# Patient Record
Sex: Male | Born: 1935 | Race: White | Hispanic: No | Marital: Married | Smoking: Former smoker
Health system: Southern US, Community
[De-identification: ages and names within clinical notes are randomized; demographics above are authoritative.]

## PROBLEM LIST (undated history)

## (undated) DIAGNOSIS — N189 Chronic kidney disease, unspecified: Secondary | ICD-10-CM

## (undated) DIAGNOSIS — G309 Alzheimer's disease, unspecified: Secondary | ICD-10-CM

## (undated) DIAGNOSIS — S3210XA Unspecified fracture of sacrum, initial encounter for closed fracture: Secondary | ICD-10-CM

## (undated) DIAGNOSIS — E039 Hypothyroidism, unspecified: Secondary | ICD-10-CM

## (undated) DIAGNOSIS — E785 Hyperlipidemia, unspecified: Secondary | ICD-10-CM

## (undated) DIAGNOSIS — F0280 Dementia in other diseases classified elsewhere without behavioral disturbance: Secondary | ICD-10-CM

## (undated) DIAGNOSIS — R0989 Other specified symptoms and signs involving the circulatory and respiratory systems: Secondary | ICD-10-CM

## (undated) DIAGNOSIS — G5790 Unspecified mononeuropathy of unspecified lower limb: Secondary | ICD-10-CM

## (undated) DIAGNOSIS — R3981 Functional urinary incontinence: Secondary | ICD-10-CM

## (undated) DIAGNOSIS — S72142A Displaced intertrochanteric fracture of left femur, initial encounter for closed fracture: Secondary | ICD-10-CM

## (undated) HISTORY — DX: Alzheimer's disease, unspecified: G30.9

## (undated) HISTORY — DX: Dementia in other diseases classified elsewhere, unspecified severity, without behavioral disturbance, psychotic disturbance, mood disturbance, and anxiety: F02.80

## (undated) HISTORY — DX: Hyperlipidemia, unspecified: E78.5

## (undated) HISTORY — PX: HERNIA REPAIR: SHX51

## (undated) HISTORY — DX: Unspecified fracture of sacrum, initial encounter for closed fracture: S32.10XA

---

## 1999-11-29 ENCOUNTER — Ambulatory Visit (HOSPITAL_COMMUNITY): Admission: RE | Admit: 1999-11-29 | Discharge: 1999-11-29 | Payer: Self-pay

## 2000-05-02 ENCOUNTER — Other Ambulatory Visit: Admission: RE | Admit: 2000-05-02 | Discharge: 2000-05-02 | Payer: Self-pay | Admitting: Surgery

## 2000-05-02 ENCOUNTER — Encounter (INDEPENDENT_AMBULATORY_CARE_PROVIDER_SITE_OTHER): Payer: Self-pay | Admitting: Specialist

## 2001-04-03 ENCOUNTER — Encounter: Payer: Self-pay | Admitting: Family Medicine

## 2001-04-03 ENCOUNTER — Ambulatory Visit (HOSPITAL_COMMUNITY): Admission: RE | Admit: 2001-04-03 | Discharge: 2001-04-03 | Payer: Self-pay | Admitting: Family Medicine

## 2001-05-01 ENCOUNTER — Ambulatory Visit (HOSPITAL_COMMUNITY): Admission: RE | Admit: 2001-05-01 | Discharge: 2001-05-01 | Payer: Self-pay | Admitting: Gastroenterology

## 2001-05-01 ENCOUNTER — Encounter (INDEPENDENT_AMBULATORY_CARE_PROVIDER_SITE_OTHER): Payer: Self-pay | Admitting: Specialist

## 2002-12-30 ENCOUNTER — Ambulatory Visit (HOSPITAL_COMMUNITY): Admission: RE | Admit: 2002-12-30 | Discharge: 2002-12-30 | Payer: Self-pay | Admitting: Orthopedic Surgery

## 2002-12-30 ENCOUNTER — Encounter: Payer: Self-pay | Admitting: Orthopedic Surgery

## 2003-03-15 ENCOUNTER — Encounter
Admission: RE | Admit: 2003-03-15 | Discharge: 2003-04-08 | Payer: Self-pay | Admitting: Physical Medicine and Rehabilitation

## 2010-01-29 ENCOUNTER — Ambulatory Visit (HOSPITAL_COMMUNITY): Admission: RE | Admit: 2010-01-29 | Discharge: 2010-01-29 | Payer: Self-pay | Admitting: Ophthalmology

## 2010-04-16 ENCOUNTER — Ambulatory Visit (HOSPITAL_COMMUNITY): Admission: RE | Admit: 2010-04-16 | Discharge: 2010-04-16 | Payer: Self-pay | Admitting: Ophthalmology

## 2010-07-03 ENCOUNTER — Ambulatory Visit: Payer: Self-pay | Admitting: Vascular Surgery

## 2010-07-04 ENCOUNTER — Ambulatory Visit: Payer: Self-pay | Admitting: Vascular Surgery

## 2011-01-28 LAB — BASIC METABOLIC PANEL
BUN: 24 mg/dL — ABNORMAL HIGH (ref 6–23)
CO2: 30 mEq/L (ref 19–32)
Calcium: 9.3 mg/dL (ref 8.4–10.5)
Chloride: 105 mEq/L (ref 96–112)
Creatinine, Ser: 1.19 mg/dL (ref 0.4–1.5)
GFR calc Af Amer: >60 mL/min (ref >60)
GFR calc non Af Amer: 59 mL/min — ABNORMAL LOW (ref >60)
Glucose, Bld: 136 mg/dL — ABNORMAL HIGH (ref 70–99)
Potassium: 4 mEq/L (ref 3.5–5.1)
Sodium: 139 mEq/L (ref 135–145)

## 2011-01-28 LAB — HEMOGLOBIN AND HEMATOCRIT, BLOOD
HCT: 37.7 % — ABNORMAL LOW (ref 39.0–52.0)
Hemoglobin: 12.7 g/dL — ABNORMAL LOW (ref 13.0–17.0)

## 2011-02-04 LAB — BASIC METABOLIC PANEL
BUN: 19 mg/dL (ref 6–23)
CO2: 31 mEq/L (ref 19–32)
Calcium: 9.5 mg/dL (ref 8.4–10.5)
Chloride: 102 mEq/L (ref 96–112)
Creatinine, Ser: 1.24 mg/dL (ref 0.4–1.5)
GFR calc Af Amer: >60 mL/min (ref >60)
GFR calc non Af Amer: 56 mL/min — ABNORMAL LOW (ref >60)
Glucose, Bld: 92 mg/dL (ref 70–99)
Potassium: 4.3 mEq/L (ref 3.5–5.1)
Sodium: 138 mEq/L (ref 135–145)

## 2011-02-04 LAB — HEMOGLOBIN AND HEMATOCRIT, BLOOD
HCT: 39.6 % (ref 39.0–52.0)
Hemoglobin: 13.5 g/dL (ref 13.0–17.0)

## 2011-03-26 NOTE — Procedures (Signed)
CAROTID DUPLEX EXAM   INDICATION:  Followup carotid artery disease.   HISTORY:  Diabetes:  No.  Cardiac:  No.  Hypertension:  No.  Smoking:  No.  Previous Surgery:  No.  CV History:  Asymptomatic, the patient had temporary blindness after  cataract surgery until second surgery.  Amaurosis Fugax No, Paresthesias No, Hemiparesis No                                       RIGHT             LEFT  Brachial systolic pressure:         132               130  Brachial Doppler waveforms:         WNL               WNL  Vertebral direction of flow:        Antegrade         Antegrade  DUPLEX VELOCITIES (cm/sec)  CCA peak systolic                   71                67  ECA peak systolic                   71                70  ICA peak systolic                   73                109  ICA end diastolic                   15                37  PLAQUE MORPHOLOGY:                  Mixed             Mixed  PLAQUE AMOUNT:                      Mild              Mild  PLAQUE LOCATION:                    ICA/bifurcation   ICA/bifurcation   IMPRESSION:  1. Bilateral internal carotid arteries show evidence of 20% to 39%.  2. Right internal carotid artery is tortuous.  3. Unable to replicate elevated velocities obtained at [facility].   ___________________________________________  Di Kindle. Edilia Bo, M.D.   AS/MEDQ  D:  07/03/2010  T:  07/03/2010  Job:  [id number]

## 2011-03-26 NOTE — Consult Note (Signed)
NEW PATIENT CONSULTATION   Joseph [name], Joseph [name]  DOB:  1926-06-09                                       07/04/2010  ZOXWR#:[id number]   I saw the patient in the office today in consultation concerning  bilateral carotid disease.  This is a pleasant 77 year old gentleman who  was found to have carotid bruits.  This prompted a duplex scan which was  done at [facility].  The duplex scan there suggested a greater  than 70% right carotid stenosis with a less than 50% left carotid  stenosis.  He was sent for vascular consultation.  Of note, he is right-  handed.  He denies any history of stroke, TIAs, expressive or receptive  aphasia or amaurosis fugax.   PAST MEDICAL HISTORY:  Significant for lumbar disk disease which has  been stable.  In addition, he had cataract surgery and required surgery  twice but is doing well from this standpoint now.  He denies any history  of diabetes, hypertension, hypercholesterolemia, history of previous  myocardial infarction, history of congestive heart failure or history of  COPD.   SOCIAL HISTORY:  He is married.  He has two children.  He is retired.  He does not smoke cigarettes.   FAMILY HISTORY:  There is no history of premature cardiovascular  disease.   REVIEW OF SYSTEMS:  GENERAL:  He has had no recent weight loss, weight  gain or problems with his appetite.  CARDIOVASCULAR:  He has had no chest pain, chest pressure, palpitations  or arrhythmias.  He does admit to dyspnea on exertion.  He has had no  orthopnea.  He has had no claudication, rest pain or nonhealing ulcers.  He has had no history of DVT or phlebitis.  MUSCULOSKELETAL:  He does have a history of lumbar disk disease and back  pain.  He has no significant arthritis.  NEUROLOGIC:  He has had no dizziness, blackouts, headaches or seizures.  GI, pulmonary, hematologic, GU, ENT, psychiatric, integumentary review  of systems is unremarkable and is documented on the  medical history form  in his chart.   PHYSICAL EXAMINATION:  General:  This is a pleasant 77 year old  gentleman who appears his stated age.  Vital signs:  His blood pressure  is 146/74, heart rate is 66, saturation 100%.  HEENT:  Unremarkable.  Lungs:  Are clear bilaterally to auscultation without rales, rhonchi or  wheezing.  Cardiovascular:  He has a soft left carotid bruit.  He has a  regular rate and rhythm.  He has palpable femoral, popliteal and  posterior tibial pulses bilaterally.  I cannot palpate dorsalis pedis  pulses.  He has no significant lower extremity swelling.  Abdomen:  Soft  and nontender with normal pitched bowel sounds.  No aneurysm is  appreciated.  Musculoskeletal:  There are no major deformities or  cyanosis.  Neurological:  He has no focal weakness or paresthesias.  Skin:  There are no ulcers or rashes.   I did review his study from [facility] and based on the velocity  criteria on the right the stenosis would still be less than, would be in  the 60%-79% range.   We did obtain a carotid duplex scan here which I independently  interpreted and we were unable to obtain the velocities noted on the  study at [facility].  He had really no significant elevated  velocities in either carotid and he had therefore less than a 39%  stenosis bilaterally.  Both vertebral arteries were patent with normally  directed flow.   Of note, the greyscale imaging done at [facility] also really did not  demonstrate any significant stenosis.   Based on our duplex scan I do not think he has significant carotid  disease.  In addition, he is asymptomatic.  I have encouraged him to  continue taking his aspirin.  I would recommend a followup duplex scan  in 1 year and that can be arranged by Dr. Christell Constant at [facility].  I  would be happy to see him at any time if any new vascular issues arise.     Di Kindle. Edilia Bo, M.D.  Electronically Signed   CSD/MEDQ  D:   07/04/2010  T:  07/05/2010  Job:  [id number]   cc:   Ernestina Penna, M.D.

## 2011-03-29 NOTE — Procedures (Signed)
[facility]  Patient:    Joseph [name], Joseph [name]                             MRN: [id number] Proc. Date: 05/01/01 Adm. Date:  [id number] Attending:  Nelda Marseille CC:         Monica Becton, M.D.   Procedure Report  PROCEDURE:  Colonoscopy.  INDICATIONS FOR PROCEDURE:  A patient with recurring diverticulitis want to make sure no other etiologies.  Consent was signed after risks, benefits, methods, and options were thoroughly discussed multiple times in the office.  MEDICINES USED:  Demerol 60, Versed 5.  DESCRIPTION OF PROCEDURE:  Rectal inspection is pertinent for external hemorrhoids. Digital exam is negative. The pediatric video colonoscope was inserted and easily advanced around the colon to the cecum. This did not require any abdominal pressure or any position changes. The cecum was identified by the appendiceal orifice and the ileocecal valve. In fact, the scope was inserted a short ways into the terminal ileum which was normal. Photo documentation was obtained. On insertion other than some occasional left sided diverticula, no other abnormalities were seen. The prep was adequate. There was some liquid stool that required washing and suctioning. On slow withdrawal through the colon, a tiny possible mid ascending polyp was seen and was cold biopsied x 2. No other polypoid lesions, masses or other abnormalities except for a rare probable prep induced erosion was seen in the rectum and distal sigmoid. Also distal sigmoid diverticula were seen only. Once back in the rectum, the scope was retroflexed pertinent for some internal hemorrhoids. The scope was straightened and readvanced a short ways around the sigmoid, air was suctioned the scope removed. The patient tolerated the procedure well and there was no obvious or immediate complication.  ENDOSCOPIC DIAGNOSIS: 1. Internal/external hemorrhoids. 2. Sigmoid diverticula only. 3. Tiny ascending polyp  cold biopsied. 4. Otherwise within normal limits to the terminal ileum.  PLAN:  Await pathology and consider repeat screening in five years. Be happy to see back p.r.n. otherwise return care to Dr. Christell Constant for the customary yearly rectals and guaiacs but based on a negative BE last year except for the diverticula and the colonoscopy this year without significant finding with the diverticula, I do not believe any further colonic workup plans need to be done in the near future. DD:  05/01/01 TD:  05/01/01 Job: [id number]

## 2011-07-17 ENCOUNTER — Ambulatory Visit: Payer: Medicare Other | Attending: Family Medicine | Admitting: Physical Therapy

## 2011-07-17 DIAGNOSIS — IMO0001 Reserved for inherently not codable concepts without codable children: Secondary | ICD-10-CM | POA: Insufficient documentation

## 2011-07-17 DIAGNOSIS — R269 Unspecified abnormalities of gait and mobility: Secondary | ICD-10-CM | POA: Insufficient documentation

## 2011-07-17 DIAGNOSIS — R5381 Other malaise: Secondary | ICD-10-CM | POA: Insufficient documentation

## 2011-07-22 ENCOUNTER — Ambulatory Visit: Payer: Medicare Other | Admitting: Physical Therapy

## 2011-07-24 ENCOUNTER — Ambulatory Visit: Payer: Medicare Other | Admitting: Physical Therapy

## 2011-07-29 ENCOUNTER — Ambulatory Visit: Payer: Medicare Other | Admitting: Physical Therapy

## 2011-07-31 ENCOUNTER — Ambulatory Visit: Payer: Medicare Other | Admitting: Physical Therapy

## 2011-08-05 ENCOUNTER — Ambulatory Visit: Payer: Medicare Other | Admitting: Physical Therapy

## 2011-08-07 ENCOUNTER — Ambulatory Visit: Payer: Medicare Other | Admitting: Physical Therapy

## 2011-08-12 ENCOUNTER — Ambulatory Visit: Payer: Medicare Other | Attending: Family Medicine | Admitting: Physical Therapy

## 2011-08-12 DIAGNOSIS — R269 Unspecified abnormalities of gait and mobility: Secondary | ICD-10-CM | POA: Insufficient documentation

## 2011-08-12 DIAGNOSIS — R5381 Other malaise: Secondary | ICD-10-CM | POA: Insufficient documentation

## 2011-08-12 DIAGNOSIS — IMO0001 Reserved for inherently not codable concepts without codable children: Secondary | ICD-10-CM | POA: Insufficient documentation

## 2011-08-14 ENCOUNTER — Ambulatory Visit: Payer: Medicare Other | Admitting: Physical Therapy

## 2011-08-19 ENCOUNTER — Ambulatory Visit: Payer: Medicare Other | Admitting: Physical Therapy

## 2011-08-21 ENCOUNTER — Ambulatory Visit: Payer: Medicare Other | Admitting: Physical Therapy

## 2011-08-26 ENCOUNTER — Ambulatory Visit: Payer: Medicare Other | Admitting: Physical Therapy

## 2011-08-28 ENCOUNTER — Encounter: Payer: Medicare Other | Admitting: Physical Therapy

## 2012-02-25 DIAGNOSIS — G609 Hereditary and idiopathic neuropathy, unspecified: Secondary | ICD-10-CM | POA: Diagnosis not present

## 2012-02-25 DIAGNOSIS — M545 Low back pain: Secondary | ICD-10-CM | POA: Diagnosis not present

## 2012-02-25 DIAGNOSIS — M5126 Other intervertebral disc displacement, lumbar region: Secondary | ICD-10-CM | POA: Diagnosis not present

## 2012-03-19 DIAGNOSIS — R209 Unspecified disturbances of skin sensation: Secondary | ICD-10-CM | POA: Diagnosis not present

## 2012-03-19 DIAGNOSIS — R269 Unspecified abnormalities of gait and mobility: Secondary | ICD-10-CM | POA: Diagnosis not present

## 2012-03-19 DIAGNOSIS — F0280 Dementia in other diseases classified elsewhere without behavioral disturbance: Secondary | ICD-10-CM | POA: Diagnosis not present

## 2012-03-19 DIAGNOSIS — M79609 Pain in unspecified limb: Secondary | ICD-10-CM | POA: Diagnosis not present

## 2012-03-26 DIAGNOSIS — R209 Unspecified disturbances of skin sensation: Secondary | ICD-10-CM | POA: Diagnosis not present

## 2012-03-26 DIAGNOSIS — R6889 Other general symptoms and signs: Secondary | ICD-10-CM | POA: Diagnosis not present

## 2012-03-26 DIAGNOSIS — F0280 Dementia in other diseases classified elsewhere without behavioral disturbance: Secondary | ICD-10-CM | POA: Diagnosis not present

## 2012-03-26 DIAGNOSIS — R269 Unspecified abnormalities of gait and mobility: Secondary | ICD-10-CM | POA: Diagnosis not present

## 2012-03-26 DIAGNOSIS — M79609 Pain in unspecified limb: Secondary | ICD-10-CM | POA: Diagnosis not present

## 2012-03-26 DIAGNOSIS — D518 Other vitamin B12 deficiency anemias: Secondary | ICD-10-CM | POA: Diagnosis not present

## 2012-03-26 DIAGNOSIS — G63 Polyneuropathy in diseases classified elsewhere: Secondary | ICD-10-CM | POA: Diagnosis not present

## 2012-04-15 DIAGNOSIS — R55 Syncope and collapse: Secondary | ICD-10-CM | POA: Diagnosis not present

## 2012-04-15 DIAGNOSIS — R0989 Other specified symptoms and signs involving the circulatory and respiratory systems: Secondary | ICD-10-CM | POA: Diagnosis not present

## 2012-04-15 DIAGNOSIS — Z79899 Other long term (current) drug therapy: Secondary | ICD-10-CM | POA: Diagnosis not present

## 2012-04-17 ENCOUNTER — Encounter: Payer: Self-pay | Admitting: Vascular Surgery

## 2012-04-17 ENCOUNTER — Other Ambulatory Visit: Payer: Self-pay

## 2012-04-17 DIAGNOSIS — R0989 Other specified symptoms and signs involving the circulatory and respiratory systems: Secondary | ICD-10-CM

## 2012-04-22 ENCOUNTER — Encounter: Payer: Self-pay | Admitting: Vascular Surgery

## 2012-04-23 ENCOUNTER — Ambulatory Visit (INDEPENDENT_AMBULATORY_CARE_PROVIDER_SITE_OTHER): Payer: Medicare Other | Admitting: Vascular Surgery

## 2012-04-23 ENCOUNTER — Encounter: Payer: Self-pay | Admitting: Vascular Surgery

## 2012-04-23 ENCOUNTER — Other Ambulatory Visit (INDEPENDENT_AMBULATORY_CARE_PROVIDER_SITE_OTHER): Payer: Medicare Other | Admitting: *Deleted

## 2012-04-23 VITALS — BP 141/75 | HR 68 | Temp 98.7°F | Ht 69.0 in | Wt 137.0 lb

## 2012-04-23 DIAGNOSIS — R0989 Other specified symptoms and signs involving the circulatory and respiratory systems: Secondary | ICD-10-CM | POA: Diagnosis not present

## 2012-04-23 HISTORY — DX: Other specified symptoms and signs involving the circulatory and respiratory systems: R09.89

## 2012-04-23 NOTE — Progress Notes (Signed)
History of Present Illness:  Patient is a 78 y.o. year old male who presents for evaluation of carotid stenosis.  Symptoms related to this stenosis include a recent brief syncopal episode lasted less than 1 minute.  The patient denies symptoms of TIA, amaurosis, or stroke.  The patient is currently on aspirin antiplatelet therapy.  The patient had a previous carotid duplex exam in our office in 2011 which showed no significant carotid stenosis.  Other medical problems include dementia.  This is currently stable and followed by Dr. Rudi Heap. The patient denies any history of palpitations. He is a former smoker but quit 4 years ago.  Past Medical History  Diagnosis Date  . Alzheimer disease     Past Surgical History  Procedure Date  . Hernia repair      Social History History  Substance Use Topics  . Smoking status: Former Smoker -- 40 years    Types: Cigarettes, Pipe    Quit date: 04/23/1972  . Smokeless tobacco: Former Neurosurgeon    Types: Chew    Quit date: 04/23/1972  . Alcohol Use: No    Family History History reviewed. No pertinent family history.  Allergies  Allergies  Allergen Reactions  . Naproxen   . Sulfa Antibiotics      Current Outpatient Prescriptions  Medication Sig Dispense Refill  . aspirin 81 MG tablet Take 81 mg by mouth daily.      . Cholecalciferol (VITAMIN D3) 2000 UNITS capsule Take 2,000 Units by mouth daily.      Marland Kitchen donepezil (ARICEPT) 10 MG tablet Take 10 mg by mouth daily.      Marland Kitchen levothyroxine (SYNTHROID, LEVOTHROID) 50 MCG tablet Take 50 mcg by mouth daily. Take 1/2 to 1 tablet once daily      . memantine (NAMENDA) 10 MG tablet Take 10 mg by mouth 2 (two) times daily.        ROS:   General:  No weight loss, Fever, chills  HEENT: No recent headaches, no nasal bleeding, no visual changes, no sore throat  Neurologic: No dizziness or seizures. No recent symptoms of stroke or mini- stroke. No recent episodes of slurred speech, or temporary  blindness.  Cardiac: No recent episodes of chest pain/pressure, no shortness of breath at rest.  No shortness of breath with exertion.  Denies history of atrial fibrillation or irregular heartbeat  Vascular: No history of rest pain in feet.  No history of claudication.  No history of non-healing ulcer, No history of DVT   Pulmonary: No home oxygen, no productive cough, no hemoptysis,  No asthma or wheezing  Musculoskeletal:  [ ]  Arthritis, [ ]  Low back pain,  [ ]  Joint pain  Hematologic:No history of hypercoagulable state.  No history of easy bleeding.  No history of anemia  Gastrointestinal: No hematochezia or melena,  No gastroesophageal reflux, no trouble swallowing  Urinary: [ ]  chronic Kidney disease, [ ]  on HD - [ ]  MWF or [ ]  TTHS, [ ]  Burning with urination, [ ]  Frequent urination, [ ]  Difficulty urinating;   Skin: No rashes  Psychological: No history of anxiety,  No history of depression   Physical Examination  Filed Vitals:   04/23/12 1236  BP: 141/75  Pulse: 68  Temp: 98.7 F (37.1 C)  TempSrc: Oral  Height: 5\' 9"  (1.753 m)  Weight: 137 lb (62.143 kg)  SpO2: 97%    Body mass index is 20.23 kg/(m^2).  General:  Alert and oriented, no acute distress HEENT:  Normal Neck: No bruit or JVD on my exam Pulmonary: Clear to auscultation bilaterally Cardiac: Regular Rate and Rhythm without murmur Gastrointestinal: Soft, non-tender, non-distended, no mass, no scars Skin: No rash Extremity Pulses:  2+ radial, brachial, femoral, popliteal pulses bilaterally, absent dorsalis pedis and posterior tibial pulses Musculoskeletal: No deformity or edema  Neurologic: Upper and lower extremity motor 5/5 and symmetric  DATA: Patient had a bilateral carotid duplex exam today which showed less than 40% stenosis bilaterally. He had antegrade vertebral flow bilaterally. I reviewed and interpreted this study. This is unchanged from 2011.   ASSESSMENT:   Syncopal episode with no  significant carotid stenosis   PLAN:  Continued followup with Dr. Rudi Heap if further episodes with consideration for cardiac evaluation   Fabienne Bruns, MD [facility] Office: [phone] Pager: [phone]

## 2012-04-24 ENCOUNTER — Other Ambulatory Visit: Payer: Self-pay | Admitting: Family Medicine

## 2012-04-24 DIAGNOSIS — M545 Low back pain: Secondary | ICD-10-CM

## 2012-04-29 ENCOUNTER — Ambulatory Visit
Admission: RE | Admit: 2012-04-29 | Discharge: 2012-04-29 | Disposition: A | Discharge status: Home / Self Care | Payer: Medicare Other | Source: Ambulatory Visit | Attending: Family Medicine | Admitting: Family Medicine

## 2012-04-29 DIAGNOSIS — M5126 Other intervertebral disc displacement, lumbar region: Secondary | ICD-10-CM | POA: Diagnosis not present

## 2012-04-29 DIAGNOSIS — M47817 Spondylosis without myelopathy or radiculopathy, lumbosacral region: Secondary | ICD-10-CM | POA: Diagnosis not present

## 2012-04-29 DIAGNOSIS — M5137 Other intervertebral disc degeneration, lumbosacral region: Secondary | ICD-10-CM | POA: Diagnosis not present

## 2012-04-29 DIAGNOSIS — M545 Low back pain: Secondary | ICD-10-CM

## 2012-05-01 NOTE — Procedures (Unsigned)
CAROTID DUPLEX EXAM  INDICATION:  Carotid bruit  HISTORY: Diabetes:  No Cardiac:  No Hypertension:  No Smoking:  Previous Previous Surgery:  No CV History:  History of possible TIA Amaurosis Fugax No, Paresthesias No, Hemiparesis No                                      RIGHT             LEFT Brachial systolic pressure: Brachial Doppler waveforms: Vertebral direction of flow:        Antegrade         Antegrade DUPLEX VELOCITIES (cm/sec) CCA peak systolic                   77                68 ECA peak systolic                   60                75 ICA peak systolic                   42                46 ICA end diastolic                   11                14 PLAQUE MORPHOLOGY:                  Mixed             Mixed PLAQUE AMOUNT:                      Mild              Mild PLAQUE LOCATION:                    ICA               ICA  IMPRESSION:  Doppler velocities suggest 1% to 39% stenoses noted in the bilateral proximal internal carotid arteries.  ___________________________________________ Janetta Hora Fields, MD  CH/MEDQ  D:  04/27/2012  T:  04/27/2012  Job:  [id number]

## 2012-05-09 DIAGNOSIS — M545 Low back pain: Secondary | ICD-10-CM | POA: Diagnosis not present

## 2012-05-21 DIAGNOSIS — K219 Gastro-esophageal reflux disease without esophagitis: Secondary | ICD-10-CM | POA: Diagnosis not present

## 2012-05-21 DIAGNOSIS — N400 Enlarged prostate without lower urinary tract symptoms: Secondary | ICD-10-CM | POA: Diagnosis not present

## 2012-06-08 DIAGNOSIS — G609 Hereditary and idiopathic neuropathy, unspecified: Secondary | ICD-10-CM | POA: Diagnosis not present

## 2012-07-09 DIAGNOSIS — G609 Hereditary and idiopathic neuropathy, unspecified: Secondary | ICD-10-CM | POA: Diagnosis not present

## 2012-08-24 DIAGNOSIS — K219 Gastro-esophageal reflux disease without esophagitis: Secondary | ICD-10-CM | POA: Diagnosis not present

## 2012-08-24 DIAGNOSIS — Z23 Encounter for immunization: Secondary | ICD-10-CM | POA: Diagnosis not present

## 2012-08-24 DIAGNOSIS — F0281 Dementia in other diseases classified elsewhere with behavioral disturbance: Secondary | ICD-10-CM | POA: Diagnosis not present

## 2012-08-27 DIAGNOSIS — R5383 Other fatigue: Secondary | ICD-10-CM | POA: Diagnosis not present

## 2012-08-27 DIAGNOSIS — E785 Hyperlipidemia, unspecified: Secondary | ICD-10-CM | POA: Diagnosis not present

## 2012-08-27 DIAGNOSIS — R5381 Other malaise: Secondary | ICD-10-CM | POA: Diagnosis not present

## 2012-08-27 DIAGNOSIS — E559 Vitamin D deficiency, unspecified: Secondary | ICD-10-CM | POA: Diagnosis not present

## 2012-09-03 DIAGNOSIS — G609 Hereditary and idiopathic neuropathy, unspecified: Secondary | ICD-10-CM | POA: Diagnosis not present

## 2012-10-20 DIAGNOSIS — R350 Frequency of micturition: Secondary | ICD-10-CM | POA: Diagnosis not present

## 2012-10-20 DIAGNOSIS — R351 Nocturia: Secondary | ICD-10-CM | POA: Diagnosis not present

## 2012-10-20 DIAGNOSIS — R3915 Urgency of urination: Secondary | ICD-10-CM | POA: Diagnosis not present

## 2012-12-08 DIAGNOSIS — I10 Essential (primary) hypertension: Secondary | ICD-10-CM | POA: Diagnosis not present

## 2012-12-08 DIAGNOSIS — E785 Hyperlipidemia, unspecified: Secondary | ICD-10-CM | POA: Diagnosis not present

## 2012-12-08 DIAGNOSIS — G589 Mononeuropathy, unspecified: Secondary | ICD-10-CM | POA: Diagnosis not present

## 2012-12-08 DIAGNOSIS — K219 Gastro-esophageal reflux disease without esophagitis: Secondary | ICD-10-CM | POA: Diagnosis not present

## 2012-12-08 DIAGNOSIS — N400 Enlarged prostate without lower urinary tract symptoms: Secondary | ICD-10-CM | POA: Diagnosis not present

## 2012-12-08 DIAGNOSIS — E039 Hypothyroidism, unspecified: Secondary | ICD-10-CM | POA: Diagnosis not present

## 2012-12-08 DIAGNOSIS — E559 Vitamin D deficiency, unspecified: Secondary | ICD-10-CM | POA: Diagnosis not present

## 2012-12-15 DIAGNOSIS — R351 Nocturia: Secondary | ICD-10-CM | POA: Diagnosis not present

## 2012-12-15 DIAGNOSIS — R3915 Urgency of urination: Secondary | ICD-10-CM | POA: Diagnosis not present

## 2013-01-07 DIAGNOSIS — H903 Sensorineural hearing loss, bilateral: Secondary | ICD-10-CM | POA: Diagnosis not present

## 2013-04-14 ENCOUNTER — Encounter: Payer: Self-pay | Admitting: Family Medicine

## 2013-04-14 ENCOUNTER — Ambulatory Visit (INDEPENDENT_AMBULATORY_CARE_PROVIDER_SITE_OTHER): Payer: Medicare Other | Admitting: Family Medicine

## 2013-04-14 VITALS — BP 120/72 | HR 69 | Temp 96.9°F | Ht 69.75 in | Wt 136.4 lb

## 2013-04-14 DIAGNOSIS — R7989 Other specified abnormal findings of blood chemistry: Secondary | ICD-10-CM

## 2013-04-14 DIAGNOSIS — R350 Frequency of micturition: Secondary | ICD-10-CM

## 2013-04-14 DIAGNOSIS — R29898 Other symptoms and signs involving the musculoskeletal system: Secondary | ICD-10-CM | POA: Insufficient documentation

## 2013-04-14 DIAGNOSIS — R5381 Other malaise: Secondary | ICD-10-CM | POA: Diagnosis not present

## 2013-04-14 DIAGNOSIS — E039 Hypothyroidism, unspecified: Secondary | ICD-10-CM | POA: Diagnosis not present

## 2013-04-14 DIAGNOSIS — R5383 Other fatigue: Secondary | ICD-10-CM | POA: Insufficient documentation

## 2013-04-14 DIAGNOSIS — R413 Other amnesia: Secondary | ICD-10-CM | POA: Diagnosis not present

## 2013-04-14 DIAGNOSIS — R799 Abnormal finding of blood chemistry, unspecified: Secondary | ICD-10-CM | POA: Diagnosis not present

## 2013-04-14 DIAGNOSIS — E785 Hyperlipidemia, unspecified: Secondary | ICD-10-CM

## 2013-04-14 DIAGNOSIS — E559 Vitamin D deficiency, unspecified: Secondary | ICD-10-CM

## 2013-04-14 LAB — POCT URINALYSIS DIPSTICK
Bilirubin, UA: NEGATIVE
Glucose, UA: NEGATIVE
Leukocytes, UA: NEGATIVE
Nitrite, UA: NEGATIVE
Urobilinogen, UA: NEGATIVE
pH, UA: 6

## 2013-04-14 LAB — BASIC METABOLIC PANEL WITH GFR
Calcium: 9.2 mg/dL (ref 8.4–10.5)
GFR, Est African American: 63 mL/min
GFR, Est Non African American: 55 mL/min — ABNORMAL LOW
Glucose, Bld: 96 mg/dL (ref 70–99)
Potassium: 4.6 mEq/L (ref 3.5–5.3)
Sodium: 140 mEq/L (ref 135–145)

## 2013-04-14 LAB — POCT CBC
HCT, POC: 42 % — AB (ref 43.5–53.7)
Hemoglobin: 14.6 g/dL (ref 14.1–18.1)
MCH, POC: 32.9 pg — AB (ref 27–31.2)
MCHC: 34.8 g/dL (ref 31.8–35.4)
MPV: 7.3 fL (ref 0–99.8)
POC LYMPH PERCENT: 21.4 %L (ref 10–50)
RBC: 4.4 M/uL — AB (ref 4.69–6.13)

## 2013-04-14 LAB — HEPATIC FUNCTION PANEL
ALT: 11 U/L (ref 0–53)
Bilirubin, Direct: 0.1 mg/dL (ref 0.0–0.3)
Indirect Bilirubin: 0.6 mg/dL (ref 0.0–0.9)
Total Bilirubin: 0.7 mg/dL (ref 0.3–1.2)

## 2013-04-14 LAB — THYROID PANEL WITH TSH
Free Thyroxine Index: 2.4 (ref 1.0–3.9)
T3 Uptake: 32.9 % (ref 22.5–37.0)
T4, Total: 7.2 ug/dL (ref 5.0–12.5)
TSH: 3.04 u[IU]/mL (ref 0.350–4.500)

## 2013-04-14 LAB — POCT UA - MICROSCOPIC ONLY
Bacteria, U Microscopic: NEGATIVE
Casts, Ur, LPF, POC: NEGATIVE

## 2013-04-14 MED ORDER — MEMANTINE HCL 10 MG PO TABS: 10.0000 mg | ORAL_TABLET | Freq: Two times a day (BID) | ORAL | Status: DC

## 2013-04-14 MED ORDER — GABAPENTIN 300 MG PO CAPS: 600.0000 mg | ORAL_CAPSULE | Freq: Two times a day (BID) | ORAL | Status: DC

## 2013-04-14 MED ORDER — DONEPEZIL HCL 10 MG PO TABS: 10.0000 mg | ORAL_TABLET | Freq: Every day | ORAL | Status: DC

## 2013-04-14 MED ORDER — LEVOTHYROXINE SODIUM 50 MCG PO TABS: 50.0000 ug | ORAL_TABLET | Freq: Every day | ORAL | Status: DC

## 2013-04-14 NOTE — Progress Notes (Signed)
Subjective:    Patient ID: Joseph [name], male    DOB: Aug 11, 1926, 79 y.o.   MRN: [id number]  HPI This patient presents for recheck of multiple medical problems. His wife accompanies the patient today accompanies the patient today. A handwritten note from the family. The note indicates that the patient saw the audiologist in February and he has a significant hearing loss. He saw the urologist and medication given has a lot of side effects and aggravates his memory. He is tiring more easily and has less energy. Legs and feet remain numb. He stays cold. And he is forgetting more. The handwritten note the family gave me will be scanned into the chart.  Patient Active Problem List   Diagnosis Date Noted  . Other symptoms involving cardiovascular system 04/23/2012    In addition, see review of system.  The allergies, current medications, past medical history, surgical history, family and social history are reviewed.  Immunizations reviewed.  Health maintenance reviewed.  The following items are outstanding: Zostavax.      Review of Systems  Constitutional: Positive for fatigue (tires more easily).  HENT: Positive for postnasal drip (due to allergies).   Eyes: Negative.   Respiratory: Negative.   Cardiovascular: Negative.   Gastrointestinal: Negative.   Endocrine: Positive for cold intolerance.  Genitourinary: Positive for urgency (worsening) and frequency (at hs).  Allergic/Immunologic: Positive for environmental allergies (seasonal).  Neurological: Positive for weakness (legs, neuropathy). Negative for dizziness and headaches.  Psychiatric/Behavioral: Positive for confusion and sleep disturbance (wakes to go urinate).       Objective:   Physical Exam BP 120/72  Pulse 69  Temp(Src) 96.9 F (36.1 C) (Oral)  Ht 5' 9.75" (1.772 m)  Wt 136 lb 6.4 oz (61.871 kg)  BMI 19.7 kg/m2  The patient appeared well nourished and normally developed.He has diminished alertness  and orientation  to time and place. Speech, behavior and judgement appear somewhat compromised . Vital signs as documented.  Head exam is unremarkable. No scleral icterus or pallor noted. He is now wearing a hearing aid in the right ear.  Neck is without jugular venous distension, or thyromegally. He does have carotid bruits. Carotid upstrokes are brisk bilaterally. No cervical adenopathy. Lungs are clear anteriorly and posteriorly to auscultation. Normal respiratory effort. Cardiac exam reveals regular rate and rhythm at 84 per minute. First and second heart sounds normal.  No murmurs, rubs or gallops.  Abdominal exam reveals normal bowl sounds, no masses, no organomegaly and no aortic enlargement. No inguinal adenopathy. Extremities are nonedematous and both femoral  pulses are normal. Skin without pallor or jaundice.  Warm and dry, without rash. Neurologic exam reveals normal deep tendon reflexes and normal sensation.           Assessment & Plan:  1. Urinary frequency - POCT urinalysis dipstick - POCT UA - Microscopic Only  2. Fatigue - POCT CBC; Standing - Thyroid Panel With TSH  3. Hypothyroid - Thyroid Panel With TSH  4. Hyperlipemia - Hepatic function panel - NMR Lipoprofile with Lipids; Standing  5. Elevated serum creatinine - BASIC METABOLIC PANEL WITH GFR; Standing  6. Vitamin D deficiency - Vitamin D 25 hydroxy; Standing  7. Memory disturbance Wife seems to think this has stabilized somewhat with medication  8. Leg weakness, bilateral write a prescription for a walker and see if we can get someone ago in the home and help train him for using this Home has been handicap equipped  Patient Instructions  Fall  precautions discussed  Continue current meds and therapeutic lifestyle changes

## 2013-04-14 NOTE — Patient Instructions (Addendum)
Fall precautions discussed Continue current meds and therapeutic lifestyle changes 

## 2013-04-14 NOTE — Addendum Note (Signed)
Addended by: Bearl Mulberry on: 04/14/2013 10:09 AM   Modules accepted: Orders

## 2013-04-14 NOTE — Addendum Note (Signed)
Addended by: Lisbeth Ply C on: 04/14/2013 10:36 AM   Modules accepted: Orders

## 2013-04-14 NOTE — Addendum Note (Signed)
Addended by: Ayaz Sondgeroth C on: 04/14/2013 10:36 AM   Modules accepted: Orders  

## 2013-04-15 ENCOUNTER — Other Ambulatory Visit: Payer: Self-pay | Admitting: *Deleted

## 2013-04-15 LAB — NMR LIPOPROFILE WITH LIPIDS
HDL Size: 9.9 nm (ref >=9.2)
HDL-C: 57 mg/dL (ref >=40)
LDL (calc): 109 mg/dL — ABNORMAL HIGH (ref <100)
LDL Particle Number: 1194 nmol/L — ABNORMAL HIGH (ref <1000)
LDL Size: 20.8 nm (ref >20.5)
LP-IR Score: <25 (ref <=45)
Small LDL Particle Number: 266 nmol/L (ref <=527)
VLDL Size: 38.8 nm (ref <=46.6)

## 2013-04-15 LAB — VITAMIN D 25 HYDROXY (VIT D DEFICIENCY, FRACTURES): Vit D, 25-Hydroxy: 49 ng/mL (ref 30–89)

## 2013-04-15 MED ORDER — MEMANTINE HCL ER 28 MG PO CP24: 1.0000 | ORAL_CAPSULE | Freq: Every day | ORAL | Status: DC

## 2013-04-22 DIAGNOSIS — G609 Hereditary and idiopathic neuropathy, unspecified: Secondary | ICD-10-CM | POA: Diagnosis not present

## 2013-04-22 DIAGNOSIS — M545 Low back pain: Secondary | ICD-10-CM | POA: Diagnosis not present

## 2013-05-19 ENCOUNTER — Encounter: Payer: Self-pay | Admitting: Family Medicine

## 2013-05-19 ENCOUNTER — Ambulatory Visit (INDEPENDENT_AMBULATORY_CARE_PROVIDER_SITE_OTHER): Payer: Medicare Other | Admitting: Family Medicine

## 2013-05-19 VITALS — BP 140/81 | HR 75 | Temp 97.2°F | Wt 139.0 lb

## 2013-05-19 DIAGNOSIS — T148 Other injury of unspecified body region: Secondary | ICD-10-CM

## 2013-05-19 DIAGNOSIS — W57XXXA Bitten or stung by nonvenomous insect and other nonvenomous arthropods, initial encounter: Secondary | ICD-10-CM

## 2013-05-19 MED ORDER — DOXYCYCLINE HYCLATE 100 MG PO TABS: 100.0000 mg | ORAL_TABLET | Freq: Two times a day (BID) | ORAL | Status: DC

## 2013-05-19 NOTE — Patient Instructions (Signed)
Contusion A contusion is a deep bruise. Contusions are the result of an injury that caused bleeding under the skin. The contusion may turn blue, purple, or yellow. Minor injuries will give you a painless contusion, but more severe contusions may stay painful and swollen for a few weeks.  CAUSES  A contusion is usually caused by a blow, trauma, or direct force to an area of the body. SYMPTOMS   Swelling and redness of the injured area.  Bruising of the injured area.  Tenderness and soreness of the injured area.  Pain. DIAGNOSIS  The diagnosis can be made by taking a history and physical exam. An X-ray, CT scan, or MRI may be needed to determine if there were any associated injuries, such as fractures. TREATMENT  Specific treatment will depend on what area of the body was injured. In general, the best treatment for a contusion is resting, icing, elevating, and applying cold compresses to the injured area. Over-the-counter medicines may also be recommended for pain control. Ask your caregiver what the best treatment is for your contusion. HOME CARE INSTRUCTIONS   Put ice on the injured area.  Put ice in a plastic bag.  Place a towel between your skin and the bag.  Leave the ice on for 15-20 minutes, 3-4 times a day.  Only take over-the-counter or prescription medicines for pain, discomfort, or fever as directed by your caregiver. Your caregiver may recommend avoiding anti-inflammatory medicines (aspirin, ibuprofen, and naproxen) for 48 hours because these medicines may increase bruising.  Rest the injured area.  If possible, elevate the injured area to reduce swelling. SEEK IMMEDIATE MEDICAL CARE IF:   You have increased bruising or swelling.  You have pain that is getting worse.  Your swelling or pain is not relieved with medicines. MAKE SURE YOU:   Understand these instructions.  Will watch your condition.  Will get help right away if you are not doing well or get  worse. Document Released: 08/07/2005 Document Revised: 01/20/2012 Document Reviewed: 09/02/2011 [employer] Patient Information 2014 [employer].  

## 2013-05-19 NOTE — Progress Notes (Signed)
  Subjective:    Patient ID: Joseph [name], male    DOB: September 17, 1926, 79 y.o.   MRN: [id number]  HPI This 79 y.o. male presents for evaluation of bruise or insect bite right leg.  His daughter Accompanies him and states he has been itching his right leg and when she looked she Found a rash and bruising and was worried it could have been a tick or spider bite.   Review of Systems No chest pain, SOB, HA, dizziness, vision change, N/V, diarrhea, constipation, dysuria, urinary urgency or frequency, myalgias, arthralgias or rash.     Objective:   Physical Exam' Vital signs noted  Well developed well nourished male.  HEENT - Head atraumatic Normocephalic. Respiratory - Lungs CTA bilateral Cardiac - RRR S1 and S2 without murmur Skin - Bruising right leg about 5cm diameter with central area with scab.      Assessment & Plan:  Tick bite Doxycycline 100mg  one po bid x 10 days #20, discussed he follow up prn if not better.

## 2013-08-11 ENCOUNTER — Ambulatory Visit (INDEPENDENT_AMBULATORY_CARE_PROVIDER_SITE_OTHER): Payer: Medicare Other

## 2013-08-11 DIAGNOSIS — Z23 Encounter for immunization: Secondary | ICD-10-CM

## 2013-09-30 ENCOUNTER — Ambulatory Visit (INDEPENDENT_AMBULATORY_CARE_PROVIDER_SITE_OTHER): Payer: Medicare Other

## 2013-09-30 ENCOUNTER — Encounter: Payer: Self-pay | Admitting: Family Medicine

## 2013-09-30 ENCOUNTER — Ambulatory Visit (INDEPENDENT_AMBULATORY_CARE_PROVIDER_SITE_OTHER): Payer: Medicare Other | Admitting: Family Medicine

## 2013-09-30 VITALS — BP 116/70 | HR 76 | Temp 97.4°F | Ht 69.0 in | Wt 135.0 lb

## 2013-09-30 DIAGNOSIS — R5381 Other malaise: Secondary | ICD-10-CM

## 2013-09-30 DIAGNOSIS — R413 Other amnesia: Secondary | ICD-10-CM | POA: Diagnosis not present

## 2013-09-30 DIAGNOSIS — R29898 Other symptoms and signs involving the musculoskeletal system: Secondary | ICD-10-CM

## 2013-09-30 DIAGNOSIS — E039 Hypothyroidism, unspecified: Secondary | ICD-10-CM

## 2013-09-30 DIAGNOSIS — R5383 Other fatigue: Secondary | ICD-10-CM

## 2013-09-30 DIAGNOSIS — E785 Hyperlipidemia, unspecified: Secondary | ICD-10-CM | POA: Diagnosis not present

## 2013-09-30 DIAGNOSIS — E559 Vitamin D deficiency, unspecified: Secondary | ICD-10-CM

## 2013-09-30 DIAGNOSIS — R799 Abnormal finding of blood chemistry, unspecified: Secondary | ICD-10-CM

## 2013-09-30 DIAGNOSIS — R7989 Other specified abnormal findings of blood chemistry: Secondary | ICD-10-CM

## 2013-09-30 DIAGNOSIS — Z23 Encounter for immunization: Secondary | ICD-10-CM

## 2013-09-30 LAB — POCT UA - MICROSCOPIC ONLY
Bacteria, U Microscopic: NEGATIVE
RBC, urine, microscopic: NEGATIVE
WBC, Ur, HPF, POC: NEGATIVE
Yeast, UA: NEGATIVE

## 2013-09-30 LAB — POCT CBC
Granulocyte percent: 68.8 %G (ref 37–80)
MCH, POC: 31.5 pg — AB (ref 27–31.2)
MCV: 96.2 fL (ref 80–97)
MPV: 7.4 fL (ref 0–99.8)
POC LYMPH PERCENT: 25.1 %L (ref 10–50)
Platelet Count, POC: 196 10*3/uL (ref 142–424)
RBC: 4.6 M/uL — AB (ref 4.69–6.13)
RDW, POC: 13.6 %

## 2013-09-30 LAB — POCT URINALYSIS DIPSTICK
Bilirubin, UA: NEGATIVE
Glucose, UA: NEGATIVE
Ketones, UA: NEGATIVE
Nitrite, UA: NEGATIVE
Spec Grav, UA: >=1.03

## 2013-09-30 NOTE — Patient Instructions (Addendum)
Continue current medications. Continue good therapeutic lifestyle changes which include good diet and exercise. Fall precautions discussed with patient. Continue as much stimulation as possible, get hearing aid repaired, continue having him use the puzzle skills Drink plenty of fluids Continue to use walker and or cane  Patient will receive Prevnar shot today

## 2013-09-30 NOTE — Progress Notes (Signed)
Subjective:    Patient ID: Joseph [name], male    DOB: Feb 23, 1926, 79 y.o.   MRN: [id number]  HPI Pt here for follow up and management of chronic medical problems. Patient comes to visit today with his wife. According to his wife he is having increasing problems with his memory and increasing problems with inappropriate behaviors. He has forgotten that he went to [employer]. He has put pepper on his breakfast cereal. He has had some accidents going to the toilet. In general he just seems to be more confused and this is worse in the morning. He uses a cane at home and not a walker. He also has a history of degenerative lumbar disc disease with spondylosis.     Patient Active Problem List   Diagnosis Date Noted  . Memory disturbance 04/14/2013  . Vitamin D deficiency 04/14/2013  . Elevated serum creatinine 04/14/2013  . Hyperlipemia 04/14/2013  . Hypothyroid 04/14/2013  . Fatigue 04/14/2013  . Leg weakness, bilateral 04/14/2013  . Other symptoms involving cardiovascular system 04/23/2012   Outpatient Encounter Prescriptions as of 09/30/2013  Medication Sig  . aspirin 81 MG tablet Take 81 mg by mouth daily.  . Cholecalciferol (VITAMIN D3) 2000 UNITS capsule Take 2,000 Units by mouth daily.  Marland Kitchen donepezil (ARICEPT) 10 MG tablet Take 1 tablet (10 mg total) by mouth daily.  Marland Kitchen gabapentin (NEURONTIN) 300 MG capsule Take 2 capsules (600 mg total) by mouth 2 (two) times daily. 1 tablet q AM and 2 tablets qhs  . levothyroxine (SYNTHROID, LEVOTHROID) 50 MCG tablet Take 1 tablet (50 mcg total) by mouth daily. Take 1/2 to 1 tablet once daily  . Memantine HCl ER (NAMENDA XR) 28 MG CP24 Take 28 mg by mouth daily.  . [DISCONTINUED] doxycycline (VIBRA-TABS) 100 MG tablet Take 1 tablet (100 mg total) by mouth 2 (two) times daily.    Review of Systems  Constitutional: Negative.   HENT: Negative.   Eyes: Negative.   Respiratory: Negative.   Cardiovascular: Negative.    Gastrointestinal: Negative.        Occasional incontinence   Endocrine: Positive for cold intolerance.  Genitourinary: Positive for difficulty urinating (takes a long time to go).  Musculoskeletal: Positive for arthralgias (bilateral leg pain).  Skin: Negative.   Allergic/Immunologic: Negative.   Neurological: Negative.   Hematological: Negative.   Psychiatric/Behavioral: Positive for confusion (memomry is worse).       Objective:   Physical Exam  Nursing note and vitals reviewed. Constitutional: He appears well-developed and well-nourished. He appears distressed (responding to questions he has a somewhat distantfaraway look and could not give me an appropriate response most of the time).  Slender  HENT:  Head: Normocephalic and atraumatic.  Right Ear: External ear normal.  Left Ear: External ear normal.  Nose: Nose normal.  Mouth/Throat: Oropharynx is clear and moist. No oropharyngeal exudate.  Eyes: Conjunctivae and EOM are normal. Pupils are equal, round, and reactive to light. Right eye exhibits no discharge. Left eye exhibits no discharge. No scleral icterus.  Neck: Normal range of motion. Neck supple. No thyromegaly present.  There were bilateral supraclavicular murmur  Cardiovascular: Normal rate and regular rhythm.  Exam reveals no gallop and no friction rub.   Murmur (there is a systolic ejection murmur grade 2/6) heard. At 72 per minute  Pulmonary/Chest: Effort normal and breath sounds normal. No respiratory distress. He has no wheezes. He has no rales. He exhibits no tenderness.  Abdominal: Soft. Bowel sounds are  normal. He exhibits no distension and no mass. There is no tenderness. There is no rebound and no guarding.  Musculoskeletal: Normal range of motion. He exhibits no edema and no tenderness.  Lymphadenopathy:    He has no cervical adenopathy.  Neurological: He is alert. No cranial nerve deficit.  Patient has an obvious memory disorder and did not know who all  was, did not know how many children he had.  Skin: Skin is warm and dry. No rash noted. No erythema. No pallor.  Psychiatric: His behavior is normal.  Mood was flat, thought content was diminished and he obviously does not have good judgment capability   BP 116/70  Pulse 76  Temp(Src) 97.4 F (36.3 C) (Oral)  Ht 5\' 9"  (1.753 m)  Wt 135 lb (61.236 kg)  BMI 19.93 kg/m2        Assessment & Plan:   1. Memory disturbance   2. Hypothyroid   3. Hyperlipemia   4. Fatigue   5. Vitamin D deficiency   6. Need for prophylactic vaccination against Streptococcus pneumoniae (pneumococcus)   7. Elevated serum creatinine   8. Leg weakness, bilateral, secondary to degenerative spondylosis and arthritis    Orders Placed This Encounter  Procedures  . Pneumococcal conjugate vaccine 13-valent  . Hepatic function panel  . BMP8+EGFR  . NMR, lipoprofile  . Vit D  25 hydroxy (rtn osteoporosis monitoring)  . POCT CBC   No orders of the defined types were placed in this encounter.   Patient Instructions  Continue current medications. Continue good therapeutic lifestyle changes which include good diet and exercise. Fall precautions discussed with patient. Continue as much stimulation as possible, get hearing aid repaired, continue having him use the puzzle skills Drink plenty of fluids Continue to use walker and or cane  Patient will receive Prevnar shot today    Nyra Capes MD

## 2013-10-02 LAB — NMR, LIPOPROFILE
HDL Cholesterol by NMR: 65 mg/dL (ref >=40)
LDL Particle Number: 1282 nmol/L — ABNORMAL HIGH (ref <1000)
LDLC SERPL CALC-MCNC: 97 mg/dL (ref <100)
Triglycerides by NMR: 99 mg/dL (ref <150)

## 2013-10-02 LAB — BMP8+EGFR
BUN: 22 mg/dL (ref 8–27)
CO2: 28 mmol/L (ref 18–29)
Calcium: 10 mg/dL (ref 8.6–10.2)
Creatinine, Ser: 1.54 mg/dL — ABNORMAL HIGH (ref 0.76–1.27)
GFR calc non Af Amer: 41 mL/min/{1.73_m2} — ABNORMAL LOW (ref >59)

## 2013-10-02 LAB — HEPATIC FUNCTION PANEL
ALT: 14 IU/L (ref 0–44)
AST: 24 IU/L (ref 0–40)

## 2013-10-02 LAB — VITAMIN D 25 HYDROXY (VIT D DEFICIENCY, FRACTURES): Vit D, 25-Hydroxy: 46.3 ng/mL (ref 30.0–100.0)

## 2013-10-12 ENCOUNTER — Telehealth: Payer: Self-pay | Admitting: *Deleted

## 2013-10-12 NOTE — Telephone Encounter (Signed)
Message copied by Bernita Buffy on Tue Oct 12, 2013 11:38 AM ------      Message from: Ernestina Penna      Created: Sat Oct 02, 2013  8:13 AM       Call his wife with these results      LFTs are within normal limit      On the BMP, the blood sugar is good at 99. The creatinine, or the most important kidney function tests, is elevated at 1.54. Make sure that he is not taking any NSAIDs. This would include ibuprofen, Advil, and Aleve. Electrolytes are within normal limits      On advanced lipid testing a total LDL particle number remains elevated at about the level it was 5 months ago. The LDL C. is 97. The triglycerides are good at 99. Continue as aggressive therapeutic lifestyle changes as possible which include diet and exercise      The vitamin D level is good at 46.3. Continue current vitamin D ------

## 2013-10-13 ENCOUNTER — Encounter: Payer: Medicare Other | Admitting: Family Medicine

## 2013-10-13 ENCOUNTER — Ambulatory Visit: Payer: Medicare Other | Admitting: Family Medicine

## 2013-10-13 NOTE — Telephone Encounter (Signed)
done

## 2013-10-13 NOTE — Progress Notes (Signed)
This encounter was created in error - please disregard.

## 2013-10-13 NOTE — Telephone Encounter (Signed)
Wife notified of labs. Verbalized understanding.

## 2013-10-25 ENCOUNTER — Telehealth: Payer: Self-pay | Admitting: *Deleted

## 2013-10-25 NOTE — Telephone Encounter (Signed)
Wife notified and verbalized understanding.  

## 2013-10-25 NOTE — Telephone Encounter (Signed)
Message copied by Baltazar Apo on Mon Oct 25, 2013 11:22 AM ------      Message from: Ernestina Penna      Created: Sat Oct 02, 2013  8:13 AM       Call his wife with these results      LFTs are within normal limit      On the BMP, the blood sugar is good at 99. The creatinine, or the most important kidney function tests, is elevated at 1.54. Make sure that he is not taking any NSAIDs. This would include ibuprofen, Advil, and Aleve. Electrolytes are within normal limits      On advanced lipid testing a total LDL particle number remains elevated at about the level it was 5 months ago. The LDL C. is 97. The triglycerides are good at 99. Continue as aggressive therapeutic lifestyle changes as possible which include diet and exercise      The vitamin D level is good at 46.3. Continue current vitamin D ------

## 2014-02-01 ENCOUNTER — Encounter: Payer: Self-pay | Admitting: Family Medicine

## 2014-02-01 ENCOUNTER — Ambulatory Visit (INDEPENDENT_AMBULATORY_CARE_PROVIDER_SITE_OTHER): Payer: Medicare Other | Admitting: Family Medicine

## 2014-02-01 VITALS — BP 127/83 | HR 69 | Temp 96.8°F | Ht 69.0 in | Wt 140.0 lb

## 2014-02-01 DIAGNOSIS — N400 Enlarged prostate without lower urinary tract symptoms: Secondary | ICD-10-CM | POA: Diagnosis not present

## 2014-02-01 DIAGNOSIS — F0280 Dementia in other diseases classified elsewhere without behavioral disturbance: Secondary | ICD-10-CM

## 2014-02-01 DIAGNOSIS — G309 Alzheimer's disease, unspecified: Secondary | ICD-10-CM | POA: Diagnosis not present

## 2014-02-01 DIAGNOSIS — N433 Hydrocele, unspecified: Secondary | ICD-10-CM | POA: Insufficient documentation

## 2014-02-01 DIAGNOSIS — E039 Hypothyroidism, unspecified: Secondary | ICD-10-CM

## 2014-02-01 DIAGNOSIS — E559 Vitamin D deficiency, unspecified: Secondary | ICD-10-CM

## 2014-02-01 DIAGNOSIS — E785 Hyperlipidemia, unspecified: Secondary | ICD-10-CM

## 2014-02-01 DIAGNOSIS — R350 Frequency of micturition: Secondary | ICD-10-CM

## 2014-02-01 LAB — POCT URINALYSIS DIPSTICK
Bilirubin, UA: NEGATIVE
Glucose, UA: NEGATIVE
KETONES UA: NEGATIVE
Leukocytes, UA: NEGATIVE
Nitrite, UA: NEGATIVE
Spec Grav, UA: >=1.03
UROBILINOGEN UA: NEGATIVE
pH, UA: 5

## 2014-02-01 LAB — POCT UA - MICROSCOPIC ONLY
CASTS, UR, LPF, POC: NEGATIVE
Crystals, Ur, HPF, POC: NEGATIVE
Yeast, UA: NEGATIVE

## 2014-02-01 LAB — POCT CBC
GRANULOCYTE PERCENT: 70.8 % (ref 37–80)
HCT, POC: 42.5 % — AB (ref 43.5–53.7)
HEMOGLOBIN: 13.7 g/dL — AB (ref 14.1–18.1)
Lymph, poc: 1.7 (ref 0.6–3.4)
MCH: 31.3 pg — AB (ref 27–31.2)
MCHC: 32.1 g/dL (ref 31.8–35.4)
MCV: 97.4 fL — AB (ref 80–97)
MPV: 7.3 fL (ref 0–99.8)
PLATELET COUNT, POC: 188 10*3/uL (ref 142–424)
POC Granulocyte: 5.1 (ref 2–6.9)
POC LYMPH PERCENT: 23.4 %L (ref 10–50)
RBC: 4.4 M/uL — AB (ref 4.69–6.13)
RDW, POC: 12.9 %
WBC: 7.2 10*3/uL (ref 4.6–10.2)

## 2014-02-01 NOTE — Addendum Note (Signed)
Addended by: Magdalene RiverBULLINS, JAMIE H on: 02/01/2014 09:29 AM   Modules accepted: Orders

## 2014-02-01 NOTE — Progress Notes (Addendum)
Subjective:    Patient ID: Joseph [name], male    DOB: 06/13/1926, 80 y.o.   MRN: [id number]  HPI Pt here for follow up and management of chronic medical problems. The patient has Alzheimer's dementia. He continues to have problems with his memory at home. He misplaces items and is now blaming other people for taking them. He's also having some issues with his hearing especially in the right ear. He seems to get exhausted more easily. And he is having has to go frequently and spends a long time emptying his bladder. The patient comes in visit today with his wife. Please see the scanned in information on that card brought in by his wife        Patient Active Problem List   Diagnosis Date Noted  . Memory disturbance 04/14/2013  . Vitamin D deficiency 04/14/2013  . Elevated serum creatinine 04/14/2013  . Hyperlipemia 04/14/2013  . Hypothyroid 04/14/2013  . Fatigue 04/14/2013  . Leg weakness, bilateral 04/14/2013  . Other symptoms involving cardiovascular system 04/23/2012   Outpatient Encounter Prescriptions as of 02/01/2014  Medication Sig  . Cholecalciferol (VITAMIN D3) 2000 UNITS capsule Take 2,000 Units by mouth daily.  Marland Kitchen donepezil (ARICEPT) 10 MG tablet Take 1 tablet (10 mg total) by mouth daily.  Marland Kitchen gabapentin (NEURONTIN) 300 MG capsule Take 2 capsules (600 mg total) by mouth 2 (two) times daily. 1 tablet q AM and 2 tablets qhs  . levothyroxine (SYNTHROID, LEVOTHROID) 50 MCG tablet Take 1 tablet (50 mcg total) by mouth daily. Take 1/2 to 1 tablet once daily  . Memantine HCl ER (NAMENDA XR) 28 MG CP24 Take 28 mg by mouth daily.  . [DISCONTINUED] aspirin 81 MG tablet Take 81 mg by mouth daily.    Review of Systems  Constitutional: Negative.   HENT: Negative.        Hearing in right ear is worse  Eyes: Negative.   Respiratory: Negative.   Cardiovascular: Negative.   Gastrointestinal: Negative.   Endocrine: Negative.   Genitourinary: Negative.   Musculoskeletal: Negative.      Left leg "locks up"at times  Skin: Negative.   Allergic/Immunologic: Negative.   Neurological: Negative.   Hematological: Negative.   Psychiatric/Behavioral: Positive for confusion.       Objective:   Physical Exam  Nursing note and vitals reviewed. Constitutional: He is oriented to person, place, and time. He appears well-developed and well-nourished. No distress.  Patient looks good and is pleasant but is having difficulty hearing and understanding communication with him today.  HENT:  Head: Normocephalic and atraumatic.  Right Ear: External ear normal.  Left Ear: External ear normal.  Nose: Nose normal.  Mouth/Throat: Oropharynx is clear and moist. No oropharyngeal exudate.  Ears cerumen was removed from the right ear canal with a curet. Hopefully this will help improve his hearing.  Eyes: Conjunctivae and EOM are normal. Pupils are equal, round, and reactive to light. Right eye exhibits no discharge. Left eye exhibits no discharge. No scleral icterus.  Neck: Normal range of motion. Neck supple. No thyromegaly present.  No carotid bruits  Cardiovascular: Normal rate, regular rhythm and intact distal pulses.  Exam reveals no gallop and no friction rub.   Murmur heard. The patient has a grade 3/6 systolic ejection murmur that radiates to both carotids. The rate today is 96 per minute and regular  Pulmonary/Chest: Effort normal and breath sounds normal. No respiratory distress. He has no wheezes. He has no rales. He exhibits no  tenderness.  No axillary adenopathy and good breath sounds bilaterally  Abdominal: Soft. Bowel sounds are normal. He exhibits no mass. There is no tenderness. There is no rebound and no guarding.  No inguinal adenopathy  Genitourinary: Rectum normal and penis normal.  The patient has an enlarged prostate. The right is firmer than the left. There no rectal masses. The external genitalia are normal except there appears to be a hydrocele or spermatocele above  the right testicle. There was no inguinal hernia. There were no inguinal nodes.  Musculoskeletal: Normal range of motion. He exhibits no edema and no tenderness.   The patient's gait is slow and he uses a cane for ambulating  Lymphadenopathy:    He has no cervical adenopathy.  Neurological: He is alert and oriented to person, place, and time. He has normal reflexes. No cranial nerve deficit.  Skin: Skin is warm and dry. No rash noted. No erythema. No pallor.  Psychiatric: He has a normal mood and affect. His behavior is normal.  Mood and affect were somewhat flat and he is certainly not capable of making good judgment in all decisions.   BP 127/83  Pulse 69  Temp(Src) 96.8 F (36 C) (Oral)  Ht 5' 9"  (1.753 m)  Wt 140 lb (63.504 kg)  BMI 20.67 kg/m2        Assessment & Plan:  1. Hyperlipemia - POCT CBC - BMP8+EGFR - Hepatic function panel - Lipid panel  2. Hypothyroid - POCT CBC - Thyroid Panel With TSH  3. Vitamin D deficiency - POCT CBC - Vit D  25 hydroxy (rtn osteoporosis monitoring)  4. BPH (benign prostatic hyperplasia)  5. Urinary frequency  6. Alzheimer's dementia without behavioral disturbance  7. Hydrocele right side   No orders of the defined types were placed in this encounter.   Patient Instructions                       Medicare Annual Wellness Visit  [place] and the medical providers at [facility] strive to bring you the best medical care.  In doing so we not only want to address your current medical conditions and concerns but also to detect new conditions early and prevent illness, disease and health-related problems.    Medicare offers a yearly Wellness Visit which allows our clinical staff to assess your need for preventative services including immunizations, lifestyle education, counseling to decrease risk of preventable diseases and screening for fall risk and other medical concerns.    This visit is provided free  of charge (no copay) for all Medicare recipients. The clinical pharmacists at [facility] have begun to conduct these Wellness Visits which will also include a thorough review of all your medications.    As you primary medical provider recommend that you make an appointment for your Annual Wellness Visit if you have not done so already this year.  You may set up this appointment before you leave today or you may call back [phone]) and schedule an appointment.  Please make sure when you call that you mention that you are scheduling your Annual Wellness Visit with the clinical pharmacist so that the appointment may be made for the proper length of time.      Continue current medications. Continue good therapeutic lifestyle changes which include good diet and exercise. Fall precautions discussed with patient. If an FOBT was given today- please return it to our front desk. If you are  over 17 years old - you may need Prevnar 15 or the adult Pneumonia vaccine.  Keep drinking plenty of fluids Stay as active as possible Always be careful and do not put yourself at risk for falling If hearing problems continued get back in touch with [place] Use the ear wax softener as needed at home We will call you with the results of the lab work once these results are available  return the FOBT    Arrie Senate MD

## 2014-02-01 NOTE — Patient Instructions (Addendum)
Medicare Annual Wellness Visit  [place] and the medical providers at [facility] strive to bring you the best medical care.  In doing so we not only want to address your current medical conditions and concerns but also to detect new conditions early and prevent illness, disease and health-related problems.    Medicare offers a yearly Wellness Visit which allows our clinical staff to assess your need for preventative services including immunizations, lifestyle education, counseling to decrease risk of preventable diseases and screening for fall risk and other medical concerns.    This visit is provided free of charge (no copay) for all Medicare recipients. The [facility] pharmacists at [facility] have begun to conduct these Wellness Visits which will also include a thorough review of all your medications.    As you primary medical provider recommend that you make an appointment for your Annual Wellness Visit if you have not done so already this year.  You may set up this appointment before you leave today or you may call back [phone] and schedule an appointment.  Please make sure when you call that you mention that you are scheduling your Annual Wellness Visit with the clinical pharmacist so that the appointment may be made for the proper length of time.      Continue current medications. Continue good therapeutic lifestyle changes which include good diet and exercise. Fall precautions discussed with patient. If an FOBT was given today- please return it to our front desk. If you are over 78 years old - you may need Prevnar 13 or the adult Pneumonia vaccine.  Keep drinking plenty of fluids Stay as active as possible Always be careful and do not put yourself at risk for falling If hearing problems continued get back in touch with us Use the ear wax softener as needed at home We will call you with the results of the lab work  once these results are available  return the FOBT

## 2014-02-02 LAB — HEPATIC FUNCTION PANEL
ALBUMIN: 4.1 g/dL (ref 3.5–4.7)
ALT: 14 IU/L (ref 0–44)
AST: 20 IU/L (ref 0–40)
Alkaline Phosphatase: 85 IU/L (ref 39–117)
BILIRUBIN DIRECT: 0.14 mg/dL (ref 0.00–0.40)
BILIRUBIN TOTAL: 0.5 mg/dL (ref 0.0–1.2)
Total Protein: 6.6 g/dL (ref 6.0–8.5)

## 2014-02-02 LAB — THYROID PANEL WITH TSH
Free Thyroxine Index: 1.4 (ref 1.2–4.9)
T3 Uptake Ratio: 26 % (ref 24–39)
T4 TOTAL: 5.3 ug/dL (ref 4.5–12.0)
TSH: 5.36 u[IU]/mL — ABNORMAL HIGH (ref 0.450–4.500)

## 2014-02-02 LAB — VITAMIN D 25 HYDROXY (VIT D DEFICIENCY, FRACTURES): VIT D 25 HYDROXY: 42.6 ng/mL (ref 30.0–100.0)

## 2014-02-02 LAB — BMP8+EGFR
BUN/Creatinine Ratio: 17 (ref 10–22)
BUN: 26 mg/dL (ref 8–27)
CALCIUM: 9.6 mg/dL (ref 8.6–10.2)
CO2: 26 mmol/L (ref 18–29)
CREATININE: 1.54 mg/dL — AB (ref 0.76–1.27)
Chloride: 99 mmol/L (ref 97–108)
GFR calc Af Amer: 47 mL/min/{1.73_m2} — ABNORMAL LOW (ref >59)
GFR, EST NON AFRICAN AMERICAN: 40 mL/min/{1.73_m2} — AB (ref >59)
Glucose: 99 mg/dL (ref 65–99)
Potassium: 4.5 mmol/L (ref 3.5–5.2)
SODIUM: 141 mmol/L (ref 134–144)

## 2014-02-02 LAB — LIPID PANEL
CHOL/HDL RATIO: 2.8 ratio (ref 0.0–5.0)
Cholesterol, Total: 176 mg/dL (ref 100–199)
HDL: 63 mg/dL (ref >39)
LDL CALC: 97 mg/dL (ref 0–99)
Triglycerides: 79 mg/dL (ref 0–149)
VLDL CHOLESTEROL CAL: 16 mg/dL (ref 5–40)

## 2014-02-03 LAB — URINE CULTURE

## 2014-04-16 ENCOUNTER — Other Ambulatory Visit: Payer: Self-pay | Admitting: Family Medicine

## 2014-05-17 ENCOUNTER — Other Ambulatory Visit: Payer: Self-pay | Admitting: Family Medicine

## 2014-05-19 NOTE — Telephone Encounter (Signed)
Last ov 3/15

## 2014-06-01 ENCOUNTER — Other Ambulatory Visit: Payer: Self-pay | Admitting: Family Medicine

## 2014-06-02 NOTE — Telephone Encounter (Signed)
Last thyroid panel was in March. Was slightly elevated. Med adjusted. Was to return in 6 weeks for a recheck and has not. Please advise on refill

## 2014-06-09 ENCOUNTER — Encounter: Payer: Self-pay | Admitting: Family Medicine

## 2014-06-09 ENCOUNTER — Ambulatory Visit (INDEPENDENT_AMBULATORY_CARE_PROVIDER_SITE_OTHER): Payer: Medicare Other | Admitting: Family Medicine

## 2014-06-09 VITALS — BP 138/77 | HR 64 | Temp 96.7°F | Ht 69.0 in | Wt 138.0 lb

## 2014-06-09 DIAGNOSIS — N400 Enlarged prostate without lower urinary tract symptoms: Secondary | ICD-10-CM | POA: Diagnosis not present

## 2014-06-09 DIAGNOSIS — E785 Hyperlipidemia, unspecified: Secondary | ICD-10-CM

## 2014-06-09 DIAGNOSIS — G309 Alzheimer's disease, unspecified: Principal | ICD-10-CM

## 2014-06-09 DIAGNOSIS — R350 Frequency of micturition: Secondary | ICD-10-CM

## 2014-06-09 DIAGNOSIS — F0280 Dementia in other diseases classified elsewhere without behavioral disturbance: Secondary | ICD-10-CM | POA: Diagnosis not present

## 2014-06-09 DIAGNOSIS — E559 Vitamin D deficiency, unspecified: Secondary | ICD-10-CM | POA: Diagnosis not present

## 2014-06-09 DIAGNOSIS — E039 Hypothyroidism, unspecified: Secondary | ICD-10-CM | POA: Diagnosis not present

## 2014-06-09 LAB — POCT URINALYSIS DIPSTICK
Bilirubin, UA: NEGATIVE
Glucose, UA: NEGATIVE
Ketones, UA: NEGATIVE
Leukocytes, UA: NEGATIVE
Nitrite, UA: NEGATIVE
SPEC GRAV UA: 1.025
UROBILINOGEN UA: NEGATIVE
pH, UA: 5

## 2014-06-09 LAB — POCT CBC
Granulocyte percent: 68.6 %G (ref 37–80)
HEMATOCRIT: 43.2 % — AB (ref 43.5–53.7)
Hemoglobin: 14 g/dL — AB (ref 14.1–18.1)
LYMPH, POC: 1.7 (ref 0.6–3.4)
MCH, POC: 31.4 pg — AB (ref 27–31.2)
MCHC: 32.3 g/dL (ref 31.8–35.4)
MCV: 97.1 fL — AB (ref 80–97)
MPV: 7 fL (ref 0–99.8)
PLATELET COUNT, POC: 197 10*3/uL (ref 142–424)
POC Granulocyte: 4.5 (ref 2–6.9)
POC LYMPH PERCENT: 25.6 %L (ref 10–50)
RBC: 4.5 M/uL — AB (ref 4.69–6.13)
RDW, POC: 13.6 %
WBC: 6.6 10*3/uL (ref 4.6–10.2)

## 2014-06-09 LAB — POCT UA - MICROSCOPIC ONLY
CRYSTALS, UR, HPF, POC: NEGATIVE
Casts, Ur, LPF, POC: NEGATIVE
WBC, Ur, HPF, POC: NEGATIVE
YEAST UA: NEGATIVE

## 2014-06-09 MED ORDER — MEMANTINE HCL-DONEPEZIL HCL ER 28-10 MG PO CP24: 1.0000 | ORAL_CAPSULE | Freq: Every day | ORAL | Status: DC

## 2014-06-09 NOTE — Patient Instructions (Addendum)
Medicare Annual Wellness Visit  [place] and the medical providers at [facility] strive to bring you the best medical care.  In doing so we not only want to address your current medical conditions and concerns but also to detect new conditions early and prevent illness, disease and health-related problems.    Medicare offers a yearly Wellness Visit which allows our clinical staff to assess your need for preventative services including immunizations, lifestyle education, counseling to decrease risk of preventable diseases and screening for fall risk and other medical concerns.    This visit is provided free of charge (no copay) for all Medicare recipients. The clinical pharmacists at [facility] have begun to conduct these Wellness Visits which will also include a thorough review of all your medications.    As you primary medical provider recommend that you make an appointment for your Annual Wellness Visit if you have not done so already this year.  You may set up thierics appointment before you leave today or you may call back [phone] and schedule an appointment.  Please make sure when you call that you mention that you are scheduling your Annual Wellness Visit with the clinical pharmacist so that the appointment may be made for the proper length of time.     Continue current medications. Continue good therapeutic lifestyle changes which include good diet and exercise. Fall precautions discussed with patient. If an FOBT was given today- please return it to our front desk. If you are over 78 years old - you may need Prevnar 13 or the adult Pneumonia vaccine.  Monitor the patient closely Changed medication as recommended when the current Namenda is completed Use the free coupon for a 30 day supply of Namzaric We will look into some type of monitor that can be worn by the patient that we'll help you track him if he roams   from the house

## 2014-06-09 NOTE — Progress Notes (Signed)
Subjective:    Patient ID: Joseph [name], male    DOB: 1925-11-28, 80 y.o.   MRN: [id number]  HPI Pt here for follow up and management of chronic medical problems. The patient's wife comes with him today to the visit. She brings in a separate list on an index card of things that are going on. These include forgetting how to shave, wandering all, doesn't know the difference in 2, but he is still able to do most things for himself. She also indicates that he is moving slower and giving out more easily and that his gait is unstable. He has to void more frequently has worse problems with his hearing. His appetite remains good. He is due for lab work today and he hasn't FOBT she will try to bring back. The patient is also unable to pick out his clothing and his wife is responsible for this.        Patient Active Problem List   Diagnosis Date Noted  . Alzheimer's dementia without behavioral disturbance 02/01/2014  . BPH (benign prostatic hyperplasia) 02/01/2014  . Right hydrocele 02/01/2014  . Memory disturbance 04/14/2013  . Vitamin D deficiency 04/14/2013  . Elevated serum creatinine 04/14/2013  . Hyperlipemia 04/14/2013  . Hypothyroid 04/14/2013  . Fatigue 04/14/2013  . Leg weakness, bilateral 04/14/2013  . Other symptoms involving cardiovascular system 04/23/2012   Outpatient Encounter Prescriptions as of 06/09/2014  Medication Sig  . Cholecalciferol (VITAMIN D3) 2000 UNITS capsule Take 2,000 Units by mouth daily.  Marland Kitchen donepezil (ARICEPT) 10 MG tablet TAKE 1 TABLET DAILY  . gabapentin (NEURONTIN) 300 MG capsule TAKE 1 CAPSULE IN THE MORNING AND 2 CAPSULES AT BEDTIME  . levothyroxine (SYNTHROID, LEVOTHROID) 50 MCG tablet TAKE 1/2 TO 1 TABLET ONCE DAILY AS DIRECTED  . NAMENDA XR 28 MG CP24 TAKE (1) CAPSULE DAILY    Review of Systems  Constitutional: Negative.   HENT: Negative.        Hearing is worse - per wife  Eyes: Negative.   Respiratory: Negative.   Cardiovascular: Negative.     Gastrointestinal: Negative.   Endocrine: Negative.   Genitourinary: Positive for frequency.  Musculoskeletal: Negative.   Skin: Negative.   Allergic/Immunologic: Negative.   Neurological: Negative.   Hematological: Negative.   Psychiatric/Behavioral: Positive for confusion.       Objective:   Physical Exam  Nursing note and vitals reviewed. Constitutional: He is oriented to person, place, and time. He appears well-developed and well-nourished.  The patient looks physically good and is well dressed. He is quiet and says very little. He did respond to commands of taking deep breaths and knowing who his boss is ,ie. his wife. He did not know my name  HENT:  Head: Normocephalic and atraumatic.  Right Ear: External ear normal.  Left Ear: External ear normal.  Nose: Nose normal.  Mouth/Throat: Oropharynx is clear and moist. No oropharyngeal exudate.  Patient appears well-hydrated.  Eyes: Conjunctivae and EOM are normal. Pupils are equal, round, and reactive to light. Right eye exhibits no discharge. Left eye exhibits no discharge. No scleral icterus.  Neck: Normal range of motion. Neck supple. No thyromegaly present.  Cardiovascular: Normal rate, regular rhythm and normal heart sounds.  Exam reveals no gallop and no friction rub.   No murmur heard. At 72 per minute  Pulmonary/Chest: Effort normal and breath sounds normal. No respiratory distress. He has no wheezes. He has no rales. He exhibits no tenderness.  Abdominal: Soft. Bowel sounds are normal.  He exhibits no mass. There is no tenderness. There is no rebound and no guarding.  Musculoskeletal: Normal range of motion. He exhibits no edema and no tenderness.  The patient uses a cane for ambulation   Lymphadenopathy:    He has no cervical adenopathy.  Neurological: He is alert and oriented to person, place, and time. He has normal reflexes. No cranial nerve deficit.  Skin: Skin is warm and dry. No rash noted. No erythema. No pallor.   Psychiatric: His behavior is normal.  The patient has a flat affect and communicates very little. His mental status is definitely declining.   BP 138/77  Pulse 64  Temp(Src) 96.7 F (35.9 C) (Oral)  Ht 5' 9"  (1.753 m)  Wt 138 lb (62.596 kg)  BMI 20.37 kg/m2        Assessment & Plan:  1. Alzheimer's dementia without behavioral disturbance - POCT CBC - Memantine HCl-Donepezil HCl (NAMZARIC) 28-10 MG CP24; Take 1 tablet by mouth daily.  Dispense: 30 capsule; Refill: 3  2. BPH (benign prostatic hyperplasia) - POCT CBC  3. Hyperlipemia - POCT CBC - BMP8+EGFR - Hepatic function panel - Lipid panel  4. Hypothyroidism, unspecified hypothyroidism type - POCT CBC  5. Vitamin D deficiency - POCT CBC - Vit D  25 hydroxy (rtn osteoporosis monitoring)  6. Urine frequency - POCT UA - Microscopic Only - POCT urinalysis dipstick  Meds ordered this encounter  Medications  . Memantine HCl-Donepezil HCl (NAMZARIC) 28-10 MG CP24    Sig: Take 1 tablet by mouth daily.    Dispense:  30 capsule    Refill:  3   Patient Instructions                       Medicare Annual Wellness Visit  [place] and the medical providers at [facility] strive to bring you the best medical care.  In doing so we not only want to address your current medical conditions and concerns but also to detect new conditions early and prevent illness, disease and health-related problems.    Medicare offers a yearly Wellness Visit which allows our clinical staff to assess your need for preventative services including immunizations, lifestyle education, counseling to decrease risk of preventable diseases and screening for fall risk and other medical concerns.    This visit is provided free of charge (no copay) for all Medicare recipients. The clinical pharmacists at [facility] have begun to conduct these Wellness Visits which will also include a thorough review of all  your medications.    As you primary medical provider recommend that you make an appointment for your Annual Wellness Visit if you have not done so already this year.  You may set up thierics appointment before you leave today or you may call back ([phone]) and schedule an appointment.  Please make sure when you call that you mention that you are scheduling your Annual Wellness Visit with the clinical pharmacist so that the appointment may be made for the proper length of time.     Continue current medications. Continue good therapeutic lifestyle changes which include good diet and exercise. Fall precautions discussed with patient. If an FOBT was given today- please return it to our front desk. If you are over 10 years old - you may need Prevnar 46 or the adult Pneumonia vaccine.  Monitor the patient closely Changed medication as recommended when the current Namenda is completed Use the free coupon for  a 30 day supply of Namzaric We will look into some type of monitor that can be worn by the patient that we'll help you track him if he roams  from the house   Arrie Senate MD

## 2014-06-10 LAB — HEPATIC FUNCTION PANEL
ALBUMIN: 4.2 g/dL (ref 3.5–4.7)
ALT: 10 IU/L (ref 0–44)
AST: 18 IU/L (ref 0–40)
Alkaline Phosphatase: 85 IU/L (ref 39–117)
Bilirubin, Direct: 0.17 mg/dL (ref 0.00–0.40)
TOTAL PROTEIN: 7.1 g/dL (ref 6.0–8.5)
Total Bilirubin: 0.7 mg/dL (ref 0.0–1.2)

## 2014-06-10 LAB — BMP8+EGFR
BUN / CREAT RATIO: 16 (ref 10–22)
BUN: 22 mg/dL (ref 8–27)
CHLORIDE: 101 mmol/L (ref 97–108)
CO2: 28 mmol/L (ref 18–29)
Calcium: 9.9 mg/dL (ref 8.6–10.2)
Creatinine, Ser: 1.34 mg/dL — ABNORMAL HIGH (ref 0.76–1.27)
GFR calc Af Amer: 55 mL/min/{1.73_m2} — ABNORMAL LOW (ref >59)
GFR calc non Af Amer: 48 mL/min/{1.73_m2} — ABNORMAL LOW (ref >59)
Glucose: 98 mg/dL (ref 65–99)
Potassium: 5.6 mmol/L — ABNORMAL HIGH (ref 3.5–5.2)
Sodium: 144 mmol/L (ref 134–144)

## 2014-06-10 LAB — LIPID PANEL
CHOL/HDL RATIO: 2.6 ratio (ref 0.0–5.0)
Cholesterol, Total: 174 mg/dL (ref 100–199)
HDL: 67 mg/dL (ref >39)
LDL CALC: 91 mg/dL (ref 0–99)
TRIGLYCERIDES: 80 mg/dL (ref 0–149)
VLDL CHOLESTEROL CAL: 16 mg/dL (ref 5–40)

## 2014-06-10 LAB — VITAMIN D 25 HYDROXY (VIT D DEFICIENCY, FRACTURES): Vit D, 25-Hydroxy: 51.4 ng/mL (ref 30.0–100.0)

## 2014-06-15 ENCOUNTER — Telehealth: Payer: Self-pay | Admitting: Family Medicine

## 2014-06-15 NOTE — Telephone Encounter (Signed)
Message copied by Azalee CourseFULP, ASHLEY on Wed Jun 15, 2014  8:29 AM ------      Message from: Ernestina PennaMOORE, DONALD W      Created: Fri Jun 10, 2014 10:12 AM       Please call the patient's wife with these results.       The blood sugar was good at 98. The creatinine the most important kidney function test was slightly improved at lower than it was 4 months ago. The potassium however was slightly elevated. The remainder of the electrolytes were within normal limits. Please have the patient's wife bring him by for a repeat potassium, he does not have to be fasting.++++      All liver function tests were within normal limits      On a traditional lipid panel all cholesterol numbers were good and the goal--- continue his aggressive therapeutic lifestyle changes as possible which include diet and exercise      The vitamin D level was within normal limits, continue current treatment ------

## 2014-08-17 ENCOUNTER — Ambulatory Visit (INDEPENDENT_AMBULATORY_CARE_PROVIDER_SITE_OTHER): Payer: Medicare Other

## 2014-08-17 DIAGNOSIS — Z23 Encounter for immunization: Secondary | ICD-10-CM | POA: Diagnosis not present

## 2014-08-22 ENCOUNTER — Other Ambulatory Visit: Payer: Self-pay | Admitting: Family Medicine

## 2014-09-22 ENCOUNTER — Other Ambulatory Visit: Payer: Self-pay | Admitting: Family Medicine

## 2014-09-28 ENCOUNTER — Ambulatory Visit (INDEPENDENT_AMBULATORY_CARE_PROVIDER_SITE_OTHER): Payer: Medicare Other | Admitting: Family Medicine

## 2014-09-28 ENCOUNTER — Encounter: Payer: Self-pay | Admitting: Family Medicine

## 2014-09-28 VITALS — BP 106/63 | HR 65 | Temp 96.5°F | Ht 69.0 in | Wt 140.0 lb

## 2014-09-28 DIAGNOSIS — G309 Alzheimer's disease, unspecified: Secondary | ICD-10-CM

## 2014-09-28 DIAGNOSIS — N400 Enlarged prostate without lower urinary tract symptoms: Secondary | ICD-10-CM | POA: Diagnosis not present

## 2014-09-28 DIAGNOSIS — F0280 Dementia in other diseases classified elsewhere without behavioral disturbance: Secondary | ICD-10-CM | POA: Diagnosis not present

## 2014-09-28 DIAGNOSIS — E039 Hypothyroidism, unspecified: Secondary | ICD-10-CM | POA: Diagnosis not present

## 2014-09-28 DIAGNOSIS — E785 Hyperlipidemia, unspecified: Secondary | ICD-10-CM | POA: Diagnosis not present

## 2014-09-28 DIAGNOSIS — E559 Vitamin D deficiency, unspecified: Secondary | ICD-10-CM

## 2014-09-28 MED ORDER — MEMANTINE HCL ER 28 MG PO CP24: ORAL_CAPSULE | ORAL | Status: DC

## 2014-09-28 NOTE — Progress Notes (Signed)
Subjective:    Patient ID: Joseph [name], male    DOB: 08-26-1926, 80 y.o.   MRN: [id number]  HPI Pt here for follow up and management of chronic medical problems. The patient comes to the visit today with his wife. They indicate they cannot afford the Alzheimer's medication. The patient's wife indicates there is no major changes. She says his memory is a little worse and his hearing is a little worse. He is moving slower and is having more difficulty understanding communication.        Patient Active Problem List   Diagnosis Date Noted  . Alzheimer's dementia without behavioral disturbance 02/01/2014  . BPH (benign prostatic hyperplasia) 02/01/2014  . Right hydrocele 02/01/2014  . Memory disturbance 04/14/2013  . Vitamin D deficiency 04/14/2013  . Elevated serum creatinine 04/14/2013  . Hyperlipemia 04/14/2013  . Hypothyroid 04/14/2013  . Fatigue 04/14/2013  . Leg weakness, bilateral 04/14/2013  . Other symptoms involving cardiovascular system 04/23/2012   Outpatient Encounter Prescriptions as of 09/28/2014  Medication Sig  . Cholecalciferol (VITAMIN D3) 2000 UNITS capsule Take 2,000 Units by mouth daily.  Marland Kitchen donepezil (ARICEPT) 10 MG tablet TAKE 1 TABLET DAILY  . gabapentin (NEURONTIN) 300 MG capsule TAKE 1 CAPSULE IN THE MORNING AND 2 CAPSULES AT BEDTIME  . levothyroxine (SYNTHROID, LEVOTHROID) 50 MCG tablet TAKE 1/2 TO 1 TABLET ONCE DAILY AS DIRECTED  . NAMENDA XR 28 MG CP24 TAKE (1) CAPSULE DAILY  . [DISCONTINUED] Memantine HCl-Donepezil HCl (NAMZARIC) 28-10 MG CP24 Take 1 tablet by mouth daily.    Review of Systems  Constitutional: Negative.   HENT: Negative.   Eyes: Negative.   Respiratory: Negative.   Cardiovascular: Negative.   Gastrointestinal: Negative.   Endocrine: Negative.   Genitourinary: Negative.   Musculoskeletal: Negative.   Skin: Negative.   Allergic/Immunologic: Negative.   Neurological: Negative.   Hematological: Negative.     Psychiatric/Behavioral: The patient is nervous/anxious (no major changes - cant afford Namzaric).        Objective:   Physical Exam  Constitutional: He is oriented to person, place, and time. He appears well-developed and well-nourished. No distress.  HENT:  Head: Normocephalic and atraumatic.  Right Ear: External ear normal.  Left Ear: External ear normal.  Nose: Nose normal.  Mouth/Throat: Oropharynx is clear and moist. No oropharyngeal exudate.  Eyes: Conjunctivae and EOM are normal. Pupils are equal, round, and reactive to light. Right eye exhibits no discharge. Left eye exhibits no discharge. No scleral icterus.  Neck: Normal range of motion. Neck supple. No thyromegaly present.  No carotid bruits or anterior cervical adenopathy  Cardiovascular: Normal rate, regular rhythm, normal heart sounds and intact distal pulses.   No murmur heard. 72/m  Pulmonary/Chest: Effort normal and breath sounds normal. No respiratory distress. He has no wheezes. He has no rales. He exhibits no tenderness.  Abdominal: Soft. Bowel sounds are normal. He exhibits no mass. There is no tenderness. There is no rebound and no guarding.  No abdominal tenderness or masses palpable  Musculoskeletal: Normal range of motion. He exhibits no edema or tenderness.  Lymphadenopathy:    He has no cervical adenopathy.  Neurological: He is oriented to person, place, and time. He has normal reflexes. No cranial nerve deficit.  The patient has a declining memory and does not follow directions well as he doesn't understand what you're asking. Even when he read my name on my jacket he was not able to tell me who I was and I have  known him for many many years.  Skin: Skin is warm and dry. No rash noted. No erythema. No pallor.  Psychiatric: He has a normal mood and affect. His behavior is normal.  Flat and quiet demeanor  Nursing note and vitals reviewed.  BP 106/63 mmHg  Pulse 65  Temp(Src) 96.5 F (35.8 C) (Oral)  Ht  _0  (1.753 m)  Wt 140 lb (63.504 kg)  BMI 20.67 kg/m2        Assessment & Plan:  1. Alzheimer's dementia without behavioral disturbance  2. BPH (benign prostatic hyperplasia)  3. Hyperlipemia - BMP8+EGFR - Hepatic function panel - Lipid panel  4. Hypothyroidism, unspecified hypothyroidism type  5. Vitamin D deficiency - Vit D  25 hydroxy (rtn osteoporosis monitoring)  Meds ordered this encounter  Medications  . Memantine HCl ER (NAMENDA XR) 28 MG CP24    Sig: TAKE (1) CAPSULE DAILY    Dispense:  30 capsule    Refill:  6   Patient Instructions                       Medicare Annual Wellness Visit  [place] and the medical providers at [facility] strive to bring you the best medical care.  In doing so we not only want to address your current medical conditions and concerns but also to detect new conditions early and prevent illness, disease and health-related problems.    Medicare offers a yearly Wellness Visit which allows our clinical staff to assess your need for preventative services including immunizations, lifestyle education, counseling to decrease risk of preventable diseases and screening for fall risk and other medical concerns.    This visit is provided free of charge (no copay) for all Medicare recipients. The clinical pharmacists at [facility] have begun to conduct these Wellness Visits which will also include a thorough review of all your medications.    As you primary medical provider recommend that you make an appointment for your Annual Wellness Visit if you have not done so already this year.  You may set up this appointment before you leave today or you may call back ([phone]) and schedule an appointment.  Please make sure when you call that you mention that you are scheduling your Annual Wellness Visit with the clinical pharmacist so that the appointment may be made for the proper length of time.      Continue current medications. Continue good therapeutic lifestyle changes which include good diet and exercise. Fall precautions discussed with patient. If an FOBT was given today- please return it to our front desk. If you are over 16 years old - you may need Prevnar 34 or the adult Pneumonia vaccine.  Flu Shots will be available at our office starting mid- September. Please call and schedule a FLU CLINIC APPOINTMENT.    Continue to monitor the patient closely, especially when he is outside.  Continue as much visual and auditory stimulation as possible and give him as much independence with supervision as possible   Arrie Senate MD

## 2014-09-28 NOTE — Patient Instructions (Addendum)
Medicare Annual Wellness Visit   and the medical providers at [facility] strive to bring you the best medical care.  In doing so we not only want to address your current medical conditions and concerns but also to detect new conditions early and prevent illness, disease and health-related problems.    Medicare offers a yearly Wellness Visit which allows our clinical staff to assess your need for preventative services including immunizations, lifestyle education, counseling to decrease risk of preventable diseases and screening for fall risk and other medical concerns.    This visit is provided free of charge (no copay) for all Medicare recipients. The clinical pharmacists at [facility] have begun to conduct these Wellness Visits which will also include a thorough review of all your medications.    As you primary medical provider recommend that you make an appointment for your Annual Wellness Visit if you have not done so already this year.  You may set up this appointment before you leave today or you may call back [phone] and schedule an appointment.  Please make sure when you call that you mention that you are scheduling your Annual Wellness Visit with the clinical pharmacist so that the appointment may be made for the proper length of time.     Continue current medications. Continue good therapeutic lifestyle changes which include good diet and exercise. Fall precautions discussed with patient. If an FOBT was given today- please return it to our front desk. If you are over 78 years old - you may need Prevnar 13 or the adult Pneumonia vaccine.  Flu Shots will be available at our office starting mid- September. Please call and schedule a FLU CLINIC APPOINTMENT.    Continue to monitor the patient closely, especially when he is outside.  Continue as much visual and auditory stimulation as possible and give him as  much independence with supervision as possible

## 2014-09-29 ENCOUNTER — Telehealth: Payer: Self-pay | Admitting: *Deleted

## 2014-09-29 LAB — HEPATIC FUNCTION PANEL
ALT: 8 IU/L (ref 0–44)
AST: 16 IU/L (ref 0–40)
Albumin: 3.8 g/dL (ref 3.5–4.7)
Alkaline Phosphatase: 87 IU/L (ref 39–117)
BILIRUBIN DIRECT: 0.12 mg/dL (ref 0.00–0.40)
BILIRUBIN TOTAL: 0.6 mg/dL (ref 0.0–1.2)
TOTAL PROTEIN: 6.8 g/dL (ref 6.0–8.5)

## 2014-09-29 LAB — LIPID PANEL
CHOLESTEROL TOTAL: 178 mg/dL (ref 100–199)
Chol/HDL Ratio: 2.8 ratio units (ref 0.0–5.0)
HDL: 63 mg/dL (ref >39)
LDL CALC: 94 mg/dL (ref 0–99)
Triglycerides: 103 mg/dL (ref 0–149)
VLDL CHOLESTEROL CAL: 21 mg/dL (ref 5–40)

## 2014-09-29 LAB — VITAMIN D 25 HYDROXY (VIT D DEFICIENCY, FRACTURES): VIT D 25 HYDROXY: 40.8 ng/mL (ref 30.0–100.0)

## 2014-09-29 LAB — CBC WITH DIFFERENTIAL
BASOS ABS: 0.1 10*3/uL (ref 0.0–0.2)
Basos: 1 %
EOS: 3 %
Eosinophils Absolute: 0.2 10*3/uL (ref 0.0–0.4)
HCT: 41.3 % (ref 37.5–51.0)
Hemoglobin: 13.6 g/dL (ref 12.6–17.7)
IMMATURE GRANS (ABS): 0 10*3/uL (ref 0.0–0.1)
Immature Granulocytes: 0 %
LYMPHS: 25 %
Lymphocytes Absolute: 1.4 10*3/uL (ref 0.7–3.1)
MCH: 31.9 pg (ref 26.6–33.0)
MCHC: 32.9 g/dL (ref 31.5–35.7)
MCV: 97 fL (ref 79–97)
MONOCYTES: 11 %
Monocytes Absolute: 0.6 10*3/uL (ref 0.1–0.9)
Neutrophils Absolute: 3.4 10*3/uL (ref 1.4–7.0)
Neutrophils Relative %: 60 %
PLATELETS: 211 10*3/uL (ref 150–379)
RBC: 4.27 x10E6/uL (ref 4.14–5.80)
RDW: 13.1 % (ref 12.3–15.4)
WBC: 5.7 10*3/uL (ref 3.4–10.8)

## 2014-09-29 LAB — BMP8+EGFR
BUN/Creatinine Ratio: 15 (ref 10–22)
BUN: 19 mg/dL (ref 8–27)
CO2: 29 mmol/L (ref 18–29)
Calcium: 9.3 mg/dL (ref 8.6–10.2)
Chloride: 98 mmol/L (ref 97–108)
Creatinine, Ser: 1.24 mg/dL (ref 0.76–1.27)
GFR calc Af Amer: 60 mL/min/1.73
GFR calc non Af Amer: 52 mL/min/1.73 — ABNORMAL LOW
Glucose: 95 mg/dL (ref 65–99)
Potassium: 4.7 mmol/L (ref 3.5–5.2)
Sodium: 140 mmol/L (ref 134–144)

## 2014-09-29 NOTE — Telephone Encounter (Signed)
-----   Message from Ernestina Pennaonald W Moore, MD sent at 09/29/2014  7:19 AM EST ----- Please call the patient's wife with these results. The blood sugar is good at 95. The creatinine, the most important kidney function test is within normal limits today. Previously it has been elevated. The electrolytes including potassium are within normal limits. All liver function tests are within normal limits A traditional lipid liver panel has all cholesterol numbers good and at goal.--- Continue good diet habits. The vitamin D level is good at 40.8. Continue vitamin D3 as doing The CBC has a normal white blood cell count. The hemoglobin is good and stable at 13.6. The platelet count is adequate.

## 2014-09-30 NOTE — Telephone Encounter (Signed)
Left results on home am as per DPR of 04/2013 

## 2014-09-30 NOTE — Telephone Encounter (Signed)
-----   Message from Donald W Moore, MD sent at 09/29/2014  7:19 AM EST ----- Please call the patient's wife with these results. The blood sugar is good at 95. The creatinine, the most important kidney function test is within normal limits today. Previously it has been elevated. The electrolytes including potassium are within normal limits. All liver function tests are within normal limits A traditional lipid liver panel has all cholesterol numbers good and at goal.--- Continue good diet habits. The vitamin D level is good at 40.8. Continue vitamin D3 as doing The CBC has a normal white blood cell count. The hemoglobin is good and stable at 13.6. The platelet count is adequate. 

## 2014-10-08 ENCOUNTER — Other Ambulatory Visit: Payer: Self-pay | Admitting: Family Medicine

## 2014-11-22 ENCOUNTER — Other Ambulatory Visit: Payer: Self-pay | Admitting: Family Medicine

## 2014-11-22 NOTE — Telephone Encounter (Signed)
Refill once. Next visit March 2016

## 2014-12-05 ENCOUNTER — Other Ambulatory Visit: Payer: Self-pay | Admitting: Family Medicine

## 2015-02-06 ENCOUNTER — Ambulatory Visit (INDEPENDENT_AMBULATORY_CARE_PROVIDER_SITE_OTHER): Payer: Medicare Other | Admitting: Family Medicine

## 2015-02-06 ENCOUNTER — Encounter: Payer: Self-pay | Admitting: Family Medicine

## 2015-02-06 VITALS — BP 128/75 | HR 83 | Temp 96.9°F | Ht 69.0 in | Wt 146.0 lb

## 2015-02-06 DIAGNOSIS — E039 Hypothyroidism, unspecified: Secondary | ICD-10-CM

## 2015-02-06 DIAGNOSIS — N400 Enlarged prostate without lower urinary tract symptoms: Secondary | ICD-10-CM | POA: Diagnosis not present

## 2015-02-06 DIAGNOSIS — E785 Hyperlipidemia, unspecified: Secondary | ICD-10-CM | POA: Diagnosis not present

## 2015-02-06 DIAGNOSIS — F0280 Dementia in other diseases classified elsewhere without behavioral disturbance: Secondary | ICD-10-CM

## 2015-02-06 DIAGNOSIS — G309 Alzheimer's disease, unspecified: Secondary | ICD-10-CM

## 2015-02-06 DIAGNOSIS — E559 Vitamin D deficiency, unspecified: Secondary | ICD-10-CM

## 2015-02-06 LAB — POCT CBC
Granulocyte percent: 73.6 %G (ref 37–80)
HCT, POC: 44.3 % (ref 43.5–53.7)
HEMOGLOBIN: 14 g/dL — AB (ref 14.1–18.1)
LYMPH, POC: 1.5 (ref 0.6–3.4)
MCH: 30.6 pg (ref 27–31.2)
MCHC: 31.6 g/dL — AB (ref 31.8–35.4)
MCV: 97 fL (ref 80–97)
MPV: 7.4 fL (ref 0–99.8)
PLATELET COUNT, POC: 226 10*3/uL (ref 142–424)
POC GRANULOCYTE: 5 (ref 2–6.9)
POC LYMPH PERCENT: 22.3 %L (ref 10–50)
RBC: 4.57 M/uL — AB (ref 4.69–6.13)
RDW, POC: 13 %
WBC: 6.8 10*3/uL (ref 4.6–10.2)

## 2015-02-06 NOTE — Patient Instructions (Addendum)
Medicare Annual Wellness Visit  [place] and the medical providers at [facility] strive to bring you the best medical care.  In doing so we not only want to address your current medical conditions and concerns but also to detect new conditions early and prevent illness, disease and health-related problems.    Medicare offers a yearly Wellness Visit which allows our clinical staff to assess your need for preventative services including immunizations, lifestyle education, counseling to decrease risk of preventable diseases and screening for fall risk and other medical concerns.    This visit is provided free of charge (no copay) for all Medicare recipients. The clinical pharmacists at [facility] have begun to conduct these Wellness Visits which will also include a thorough review of all your medications.    As you primary medical provider recommend that you make an appointment for your Annual Wellness Visit if you have not done so already this year.  You may set up this appointment before you leave today or you may call back [phone] and schedule an appointment.  Please make sure when you call that you mention that you are scheduling your Annual Wellness Visit with the clinical pharmacist so that the appointment may be made for the proper length of time.     Continue current medications. Continue good therapeutic lifestyle changes which include good diet and exercise. Fall precautions discussed with patient. If an FOBT was given today- please return it to our front desk. If you are over 79 years old - you may need Prevnar 13 or the adult Pneumonia vaccine.  Flu Shots are still available at our office. If you still haven't had one please call to set up a nurse visit to get one.   After your visit with us today you will receive a survey in the mail or online from [employer] regarding your care with us. Please take a moment to  fill this out. Your feedback is very important to us as you can help us better understand your patient needs as well as improve your experience and satisfaction. WE CARE ABOUT YOU!!!   We will have our home health specialist to check and see what care coordination can be provided to help the wife who is the primary caregiver at home with this patient so they can keep him at home. She should be monitored closely and make sure that he does not fall and break any hips. Change the memory medicine and give this to him in the evening.

## 2015-02-06 NOTE — Progress Notes (Signed)
Subjective:    Patient ID: Joseph [name], male    DOB: Jan 23, 1926, 81 y.o.   MRN: [id number]  HPI Pt here for follow up and management of chronic medical problems which includes alzheimer's, hypothyroid, and hyperlipidemia. Per his daughter, he is taking medications regularly. The patient's wife, who usually comes with him has had a toe removed. The patient comes with his daughter. She indicates that he is sleeping more during the day and still has confusion. He is taking his Aricept and Namenda during the day. The patient is still able to take care of his activities of daily living like bathing eating and going to the bathroom. I have known the patient for many many years and he did not know my name today.        Patient Active Problem List   Diagnosis Date Noted  . Alzheimer's dementia without behavioral disturbance 02/01/2014  . BPH (benign prostatic hyperplasia) 02/01/2014  . Right hydrocele 02/01/2014  . Memory disturbance 04/14/2013  . Vitamin D deficiency 04/14/2013  . Elevated serum creatinine 04/14/2013  . Hyperlipemia 04/14/2013  . Hypothyroid 04/14/2013  . Fatigue 04/14/2013  . Leg weakness, bilateral 04/14/2013  . Other symptoms involving cardiovascular system 04/23/2012   Outpatient Encounter Prescriptions as of 02/06/2015  Medication Sig  . Cholecalciferol (VITAMIN D3) 2000 UNITS capsule Take 2,000 Units by mouth daily.  Marland Kitchen donepezil (ARICEPT) 10 MG tablet TAKE 1 TABLET DAILY  . gabapentin (NEURONTIN) 300 MG capsule TAKE 1 CAPSULE IN THE MORNING AND 2 CAPSULES AT BEDTIME  . levothyroxine (SYNTHROID, LEVOTHROID) 50 MCG tablet TAKE 1/2 TO 1 TABLET ONCE DAILY AS DIRECTED (Patient taking differently: TAKE 1/2 TABLET ONCE DAILY)  . Memantine HCl ER (NAMENDA XR) 28 MG CP24 TAKE (1) CAPSULE DAILY    Review of Systems  Constitutional: Negative.        Sleeping a lot during the day   HENT: Negative.   Eyes: Negative.   Respiratory: Negative.   Cardiovascular: Negative.     Gastrointestinal: Negative.   Endocrine: Negative.   Genitourinary: Negative.   Musculoskeletal: Negative.   Skin: Negative.   Allergic/Immunologic: Negative.   Neurological: Negative.   Hematological: Negative.   Psychiatric/Behavioral: Positive for confusion.       Objective:   Physical Exam  Constitutional: He appears well-nourished.  The patient was quiet but responsive to directions as far as swallowing taking a deep breath etc. There was not a lot of other communication with him.  HENT:  Head: Normocephalic and atraumatic.  Right Ear: External ear normal.  Left Ear: External ear normal.  Nose: Nose normal.  Mouth/Throat: Oropharynx is clear and moist. No oropharyngeal exudate.  The mouth and oral hygiene appear within normal limits. He is unshaven and his hair appears to be longer than usual   Eyes: Conjunctivae and EOM are normal. Pupils are equal, round, and reactive to light. Right eye exhibits no discharge. Left eye exhibits no discharge. No scleral icterus.  Neck: Normal range of motion. Neck supple. No thyromegaly present.  No thyromegaly or carotid bruits  Cardiovascular: Normal rate, regular rhythm, normal heart sounds and intact distal pulses.   No murmur heard. At 84/m  Pulmonary/Chest: Effort normal and breath sounds normal. No respiratory distress. He has no wheezes. He has no rales. He exhibits no tenderness.  Both axillary regions are negative for adenopathy. Lungs are clear anteriorly and posteriorly  Abdominal: Soft. Bowel sounds are normal. He exhibits no mass. There is no tenderness. There is  no rebound and no guarding.  There were no abdominal masses tenderness or inguinal adenopathy. There were no abdominal bruits.  Musculoskeletal: He exhibits no edema or tenderness.  The patient is slow and his movement. He does not appear to have any joint tenderness or pain.  Lymphadenopathy:    He has no cervical adenopathy.  Neurological: He is alert. He has  normal reflexes. No cranial nerve deficit.  The patient is obviously memory deprived.  Skin: Skin is warm and dry. No rash noted.  Psychiatric: He has a normal mood and affect. His behavior is normal. Thought content normal.  Nursing note and vitals reviewed.  BP 128/75 mmHg  Pulse 83  Temp(Src) 96.9 F (36.1 C) (Oral)  Ht 5' 9"  (1.753 m)  Wt 146 lb (66.225 kg)  BMI 21.55 kg/m2        Assessment & Plan:  1. BPH (benign prostatic hyperplasia) -The daughter indicates the patient is having no problems with his voiding although he has to sit down to void. - POCT CBC  2. Alzheimer's dementia without behavioral disturbance -This seems to be more progressive. The patient does not call people by name anymore. He is sleeping a lot during the day and we will have him change his Aricept and Namenda to evening dosing. -We will also arrange for extra caregiving services that may be available and we will get in touch with the daughter as soon as we find out what services may be available to help the patient's wife take better care of him at home. - POCT CBC  3. Hyperlipemia -We are no longer giving him cholesterol medication because of the multiple other issues going on with the patient - POCT CBC - BMP8+EGFR - Hepatic function panel - Lipid panel  4. Hypothyroidism, unspecified hypothyroidism type -We will recommend that he continue to take his thyroid medicine pending results of lab work - POCT CBC - Thyroid Panel With TSH  5. Vitamin D deficiency -He should continue the vitamin D pending results of lab work - POCT CBC - Vit D  25 hydroxy (rtn osteoporosis monitoring)  The patient's Namenda and Aricept and we will check with the pharmacy to see the cost of namzeric  Patient Instructions                       Medicare Annual Wellness Visit  [place] and the medical providers at [facility] strive to bring you the best medical care.  In doing so we not  only want to address your current medical conditions and concerns but also to detect new conditions early and prevent illness, disease and health-related problems.    Medicare offers a yearly Wellness Visit which allows our clinical staff to assess your need for preventative services including immunizations, lifestyle education, counseling to decrease risk of preventable diseases and screening for fall risk and other medical concerns.    This visit is provided free of charge (no copay) for all Medicare recipients. The clinical pharmacists at [facility] have begun to conduct these Wellness Visits which will also include a thorough review of all your medications.    As you primary medical provider recommend that you make an appointment for your Annual Wellness Visit if you have not done so already this year.  You may set up this appointment before you leave today or you may call back ([phone]) and schedule an appointment.  Please make sure when you call that  you mention that you are scheduling your Annual Wellness Visit with the clinical pharmacist so that the appointment may be made for the proper length of time.     Continue current medications. Continue good therapeutic lifestyle changes which include good diet and exercise. Fall precautions discussed with patient. If an FOBT was given today- please return it to our front desk. If you are over 30 years old - you may need Prevnar 62 or the adult Pneumonia vaccine.  Flu Shots are still available at our office. If you still haven't had one please call to set up a nurse visit to get one.   After your visit with [place] today you will receive a survey in the mail or online from [employer] regarding your care with [place]. Please take a moment to fill this out. Your feedback is very important to [place] as you can help [place] better understand your patient needs as well as improve your experience and satisfaction. WE CARE ABOUT YOU!!!   We will  have our home health specialist to check and see what care coordination can be provided to help the wife who is the primary caregiver at home with this patient so they can keep him at home. She should be monitored closely and make sure that he does not fall and break any hips. Change the memory medicine and give this to him in the evening.   Arrie Senate MD

## 2015-02-07 ENCOUNTER — Telehealth: Payer: Self-pay | Admitting: Family Medicine

## 2015-02-07 LAB — HEPATIC FUNCTION PANEL
ALT: 12 IU/L (ref 0–44)
AST: 20 IU/L (ref 0–40)
Albumin: 4.1 g/dL (ref 3.5–4.7)
Alkaline Phosphatase: 88 IU/L (ref 39–117)
BILIRUBIN TOTAL: 0.5 mg/dL (ref 0.0–1.2)
Bilirubin, Direct: 0.12 mg/dL (ref 0.00–0.40)
Total Protein: 6.6 g/dL (ref 6.0–8.5)

## 2015-02-07 LAB — LIPID PANEL
CHOLESTEROL TOTAL: 188 mg/dL (ref 100–199)
Chol/HDL Ratio: 3 ratio units (ref 0.0–5.0)
HDL: 62 mg/dL (ref >39)
LDL CALC: 92 mg/dL (ref 0–99)
Triglycerides: 170 mg/dL — ABNORMAL HIGH (ref 0–149)
VLDL CHOLESTEROL CAL: 34 mg/dL (ref 5–40)

## 2015-02-07 LAB — THYROID PANEL WITH TSH
FREE THYROXINE INDEX: 1.6 (ref 1.2–4.9)
T3 Uptake Ratio: 27 % (ref 24–39)
T4, Total: 5.8 ug/dL (ref 4.5–12.0)
TSH: 5.46 u[IU]/mL — AB (ref 0.450–4.500)

## 2015-02-07 LAB — BMP8+EGFR
BUN/Creatinine Ratio: 13 (ref 10–22)
BUN: 17 mg/dL (ref 8–27)
CO2: 27 mmol/L (ref 18–29)
Calcium: 9.2 mg/dL (ref 8.6–10.2)
Chloride: 98 mmol/L (ref 97–108)
Creatinine, Ser: 1.34 mg/dL — ABNORMAL HIGH (ref 0.76–1.27)
GFR calc Af Amer: 55 mL/min/{1.73_m2} — ABNORMAL LOW (ref >59)
GFR calc non Af Amer: 47 mL/min/{1.73_m2} — ABNORMAL LOW (ref >59)
Glucose: 94 mg/dL (ref 65–99)
POTASSIUM: 4.3 mmol/L (ref 3.5–5.2)
Sodium: 140 mmol/L (ref 134–144)

## 2015-02-07 LAB — VITAMIN D 25 HYDROXY (VIT D DEFICIENCY, FRACTURES): Vit D, 25-Hydroxy: 43.9 ng/mL (ref 30.0–100.0)

## 2015-02-07 MED ORDER — MEMANTINE HCL ER 28 MG PO CP24: ORAL_CAPSULE | ORAL | Status: DC

## 2015-02-07 MED ORDER — DONEPEZIL HCL 10 MG PO TABS: 10.0000 mg | ORAL_TABLET | Freq: Every day | ORAL | Status: DC

## 2015-02-07 NOTE — Telephone Encounter (Signed)
Refills sent to pharm

## 2015-02-09 ENCOUNTER — Telehealth: Payer: Self-pay | Admitting: Family Medicine

## 2015-02-10 ENCOUNTER — Other Ambulatory Visit: Payer: Self-pay | Admitting: *Deleted

## 2015-02-10 MED ORDER — LEVOTHYROXINE SODIUM 75 MCG PO TABS: 75.0000 ug | ORAL_TABLET | Freq: Every day | ORAL | Status: DC

## 2015-02-10 NOTE — Telephone Encounter (Signed)
Notes under lab results 

## 2015-02-14 ENCOUNTER — Telehealth: Payer: Self-pay | Admitting: Family Medicine

## 2015-02-15 NOTE — Telephone Encounter (Signed)
Called daughter and explained private duty was the only option at this time.

## 2015-03-10 ENCOUNTER — Other Ambulatory Visit: Payer: Self-pay | Admitting: Family Medicine

## 2015-06-08 ENCOUNTER — Encounter: Payer: Self-pay | Admitting: Family Medicine

## 2015-06-08 ENCOUNTER — Ambulatory Visit (INDEPENDENT_AMBULATORY_CARE_PROVIDER_SITE_OTHER): Payer: Medicare Other | Admitting: Family Medicine

## 2015-06-08 ENCOUNTER — Other Ambulatory Visit: Payer: Self-pay | Admitting: *Deleted

## 2015-06-08 VITALS — BP 131/79 | HR 81 | Temp 97.6°F | Ht 69.0 in | Wt 140.0 lb

## 2015-06-08 DIAGNOSIS — E785 Hyperlipidemia, unspecified: Secondary | ICD-10-CM | POA: Diagnosis not present

## 2015-06-08 DIAGNOSIS — R32 Unspecified urinary incontinence: Secondary | ICD-10-CM

## 2015-06-08 DIAGNOSIS — G309 Alzheimer's disease, unspecified: Secondary | ICD-10-CM

## 2015-06-08 DIAGNOSIS — R2681 Unsteadiness on feet: Secondary | ICD-10-CM

## 2015-06-08 DIAGNOSIS — R3981 Functional urinary incontinence: Secondary | ICD-10-CM

## 2015-06-08 DIAGNOSIS — F0280 Dementia in other diseases classified elsewhere without behavioral disturbance: Secondary | ICD-10-CM

## 2015-06-08 DIAGNOSIS — N400 Enlarged prostate without lower urinary tract symptoms: Secondary | ICD-10-CM

## 2015-06-08 DIAGNOSIS — E559 Vitamin D deficiency, unspecified: Secondary | ICD-10-CM | POA: Diagnosis not present

## 2015-06-08 DIAGNOSIS — R531 Weakness: Secondary | ICD-10-CM | POA: Diagnosis not present

## 2015-06-08 DIAGNOSIS — E039 Hypothyroidism, unspecified: Secondary | ICD-10-CM

## 2015-06-08 DIAGNOSIS — R159 Full incontinence of feces: Secondary | ICD-10-CM

## 2015-06-08 HISTORY — DX: Functional urinary incontinence: R39.81

## 2015-06-08 LAB — POCT CBC
GRANULOCYTE PERCENT: 61.8 % (ref 37–80)
HCT, POC: 45.5 % (ref 43.5–53.7)
HEMOGLOBIN: 14.7 g/dL (ref 14.1–18.1)
Lymph, poc: 2 (ref 0.6–3.4)
MCH, POC: 30.3 pg (ref 27–31.2)
MCHC: 32.2 g/dL (ref 31.8–35.4)
MCV: 94 fL (ref 80–97)
MPV: 7.2 fL (ref 0–99.8)
POC GRANULOCYTE: 4.1 (ref 2–6.9)
POC LYMPH PERCENT: 29.7 %L (ref 10–50)
Platelet Count, POC: 210 10*3/uL (ref 142–424)
RBC: 4.84 M/uL (ref 4.69–6.13)
RDW, POC: 13.6 %
WBC: 6.7 10*3/uL (ref 4.6–10.2)

## 2015-06-08 NOTE — Patient Instructions (Addendum)
Continue current medications. Continue good therapeutic lifestyle changes which include good diet and exercise. Fall precautions discussed with patient. If an FOBT was given today- please return it to our front desk. If you are over 79 years old - you may need Prevnar 13 or the adult Pneumonia vaccine.   After your visit with [place] today you will receive a survey in the mail or online from [employer] regarding your care with [place]. Please take a moment to fill this out. Your feedback is very important to [place] as you can help [place] better understand your patient needs as well as improve your experience and satisfaction. WE CARE ABOUT YOU!!!                           Medicare Annual Wellness Visit  [facility] and the medical providers at [facility] strive to bring you the best medical care.  In doing so we not only want to address your current medical conditions and concerns but also to detect new conditions early and prevent illness, disease and health-related problems.    Medicare offers a yearly Wellness Visit which allows our clinical staff to assess your need for preventative services including immunizations, lifestyle education, counseling to decrease risk of preventable diseases and screening for fall risk and other medical concerns.    This visit is provided free of charge (no copay) for all Medicare recipients. The clinical pharmacists at [facility] have begun to conduct these Wellness Visits which will also include a thorough review of all your medications.    As you primary medical provider recommend that you make an appointment for your Annual Wellness Visit if you have not done so already this year.  You may set up this appointment before you leave today or you may call back ([phone]) and schedule an appointment.  Please make sure when you call that you mention that you are scheduling your Annual Wellness Visit with the clinical pharmacist so  that the appointment may be made for the proper length of time.    We will sign an order recommending that the patient has home health care with physical therapy because of his progressive decline in cognitive symptoms. The family and services should continue to provide assisted care with helping the patient's spouse in the home setting for his well-being. Continue to keep him as active as possible

## 2015-06-08 NOTE — Progress Notes (Signed)
Subjective:    Patient ID: Joseph [name], male    DOB: [birth year], 81 y.o.   MRN: [id number]  HPI  Pt here for follow up and management of chronic medical problems which includes hyperlipidemia and hypothyroidism.  He is taking medications regularly.  He is accompanied by wife and daughter. The patient is now wearing depends due to urinary and stool incontinence. He now needs help dressing himself. The urine has a foul odor at times. He needs assistance with getting out of his chair. His appetite is still good and he goes to the bathroom is still has incontinence and his depends. There is no history from the family of chest pain shortness of breath abdominal pain. He is now having fecal and urinary incontinence and using depends. There definitely needing more assistance in the home with taking care of this patient. His appetite remains good.       Patient Active Problem List   Diagnosis Date Noted  . Alzheimer's dementia without behavioral disturbance 02/01/2014  . BPH (benign prostatic hyperplasia) 02/01/2014  . Right hydrocele 02/01/2014  . Memory disturbance 04/14/2013  . Vitamin D deficiency 04/14/2013  . Elevated serum creatinine 04/14/2013  . Hyperlipemia 04/14/2013  . Hypothyroid 04/14/2013  . Fatigue 04/14/2013  . Leg weakness, bilateral 04/14/2013  . Other symptoms involving cardiovascular system 04/23/2012   Outpatient Encounter Prescriptions as of 06/08/2015  Medication Sig  . Cholecalciferol (VITAMIN D3) 2000 UNITS capsule Take 2,000 Units by mouth daily.  Marland Kitchen donepezil (ARICEPT) 10 MG tablet Take 1 tablet (10 mg total) by mouth daily.  Marland Kitchen gabapentin (NEURONTIN) 300 MG capsule TAKE 1 CAPSULE IN THE MORNING AND 2 CAPSULES AT BEDTIME  . levothyroxine (SYNTHROID, LEVOTHROID) 75 MCG tablet Take 1 tablet (75 mcg total) by mouth daily.  . memantine (NAMENDA XR) 28 MG CP24 24 hr capsule TAKE (1) CAPSULE DAILY  . [DISCONTINUED] levothyroxine (SYNTHROID, LEVOTHROID) 50 MCG tablet TAKE  1/2 TO 1 TABLET ONCE DAILY AS DIRECTED (Patient taking differently: TAKE 1/2 TABLET ONCE DAILY)   No facility-administered encounter medications on file as of 06/08/2015.     Review of Systems  Constitutional: Positive for activity change and fatigue.  HENT: Negative.   Eyes: Negative.   Respiratory: Negative.   Cardiovascular: Negative.   Gastrointestinal: Negative.        Incontinent bowel and bladder  Endocrine: Negative.   Genitourinary: Negative.   Musculoskeletal: Negative.   Skin: Negative.   Allergic/Immunologic: Negative.   Neurological: Positive for weakness.  Hematological: Negative.   Psychiatric/Behavioral: Negative.        Objective:   Physical Exam  Constitutional: He is oriented to person, place, and time. He appears well-developed and well-nourished. No distress.  Elderly with progressive memory impairment  HENT:  Head: Normocephalic and atraumatic.  Right Ear: External ear normal.  Left Ear: External ear normal.  Nose: Nose normal.  Mouth/Throat: Oropharynx is clear and moist. No oropharyngeal exudate.  Eyes: Conjunctivae and EOM are normal. Pupils are equal, round, and reactive to light. Right eye exhibits no discharge. Left eye exhibits no discharge. No scleral icterus.  Neck: Normal range of motion. Neck supple. No thyromegaly present.  Cardiovascular: Normal rate, regular rhythm and intact distal pulses.   Murmur heard. At 72/m with a grade 2/6 systolic ejection murmur also radiating to both carotids  Pulmonary/Chest: Effort normal and breath sounds normal. No respiratory distress. He has no wheezes. He has no rales. He exhibits no tenderness.  Clear anteriorly and posteriorly  Abdominal: Soft. Bowel sounds are normal. He exhibits no mass. There is no tenderness. There is no rebound and no guarding.  No abdominal tenderness or masses or bruits  Musculoskeletal: He exhibits no edema or tenderness.  Range of motion is slow and he needs assistance with  getting on the table and getting off the table  Lymphadenopathy:    He has no cervical adenopathy.  Neurological: He is oriented to person, place, and time.  Patient is unaware of the name of his daughter who was in the room with him. She sees him daily. He did not know my name. He has a flat affect but did have a small occasionally but is unresponsive to any conversation.  Skin: Skin is warm and dry. No rash noted. No erythema. No pallor.  Psychiatric: He has a normal mood and affect. His behavior is normal. Thought content normal.  Nursing note and vitals reviewed.  BP 131/79 mmHg  Pulse 81  Temp(Src) 97.6 F (36.4 C) (Oral)  Ht 5' 9"  (1.753 m)  Wt 140 lb (63.504 kg)  BMI 20.67 kg/m2  Currently for the family long-term care in a facility outside the home is not an option      Assessment & Plan:  1. BPH (benign prostatic hyperplasia) -The patient is now incontinent and wearing depends - POCT CBC - BMP8+EGFR - Hepatic function panel  2. Hyperlipemia -He should continue with dietary management of this. - POCT CBC - BMP8+EGFR - Hepatic function panel - NMR, lipoprofile  3. Hypothyroidism, unspecified hypothyroidism type -He is currently taking levothyroxine 75 g and we will see if this dose is still appropriate with the lab work were doing today. - POCT CBC - Thyroid Panel With TSH  4. Vitamin D deficiency -Continue current treatment pending results of lab work - POCT CBC  5. Incontinence of bowel -Avoid caffeine and encouraged plenty of fluids - Ambulatory referral to Home Health  6. Urinary incontinence, unspecified incontinence type -Avoid caffeine and encouraged plenty of fluids - Ambulatory referral to Home Health  7. Gait instability -Continue with assistance as much as possible if the knee becomes necessary we can always write a prescription for a walker - Ambulatory referral to [facility]  8. Weakness generalized -We will get physical therapy to come  in the home to see if they can help strengthen him with his gait - Ambulatory referral to [place]  9. Urinary incontinence due to cognitive impairment -Continue with depends--- get urinalysis if possible  10. Alzheimer's dementia without behavioral disturbance -Continue with Namenda and Aricept  Patient Instructions  Continue current medications. Continue good therapeutic lifestyle changes which include good diet and exercise. Fall precautions discussed with patient. If an FOBT was given today- please return it to our front desk. If you are over 18 years old - you may need Prevnar 51 or the adult Pneumonia vaccine.   After your visit with [place] today you will receive a survey in the mail or online from [employer] regarding your care with [place]. Please take a moment to fill this out. Your feedback is very important to [place] as you can help [place] better understand your patient needs as well as improve your experience and satisfaction. WE CARE ABOUT YOU!!!                           Medicare Annual Wellness Visit  [facility] and the medical providers at [facility]  strive to bring you the best medical care.  In doing so we not only want to address your current medical conditions and concerns but also to detect new conditions early and prevent illness, disease and health-related problems.    Medicare offers a yearly Wellness Visit which allows our clinical staff to assess your need for preventative services including immunizations, lifestyle education, counseling to decrease risk of preventable diseases and screening for fall risk and other medical concerns.    This visit is provided free of charge (no copay) for all Medicare recipients. The clinical pharmacists at [facility] have begun to conduct these Wellness Visits which will also include a thorough review of all your medications.    As you primary medical provider recommend that you make an  appointment for your Annual Wellness Visit if you have not done so already this year.  You may set up this appointment before you leave today or you may call back ([phone]) and schedule an appointment.  Please make sure when you call that you mention that you are scheduling your Annual Wellness Visit with the clinical pharmacist so that the appointment may be made for the proper length of time.    We will sign an order recommending that the patient has home health care with physical therapy because of his progressive decline in cognitive symptoms. The family and services should continue to provide assisted care with helping the patient's spouse in the home setting for his well-being. Continue to keep him as active as possible   Arrie Senate MD

## 2015-06-09 LAB — BMP8+EGFR
BUN/Creatinine Ratio: 13 (ref 10–22)
BUN: 16 mg/dL (ref 8–27)
CALCIUM: 9.4 mg/dL (ref 8.6–10.2)
CO2: 26 mmol/L (ref 18–29)
CREATININE: 1.23 mg/dL (ref 0.76–1.27)
Chloride: 98 mmol/L (ref 97–108)
GFR calc Af Amer: 61 mL/min/{1.73_m2} (ref >59)
GFR calc non Af Amer: 52 mL/min/{1.73_m2} — ABNORMAL LOW (ref >59)
Glucose: 96 mg/dL (ref 65–99)
Potassium: 4.7 mmol/L (ref 3.5–5.2)
SODIUM: 140 mmol/L (ref 134–144)

## 2015-06-09 LAB — HEPATIC FUNCTION PANEL
ALBUMIN: 4.1 g/dL (ref 3.5–4.7)
ALT: 11 IU/L (ref 0–44)
AST: 19 IU/L (ref 0–40)
Alkaline Phosphatase: 78 IU/L (ref 39–117)
Bilirubin Total: 0.8 mg/dL (ref 0.0–1.2)
Bilirubin, Direct: 0.19 mg/dL (ref 0.00–0.40)
Total Protein: 6.9 g/dL (ref 6.0–8.5)

## 2015-06-09 LAB — NMR, LIPOPROFILE
Cholesterol: 187 mg/dL (ref 100–199)
HDL CHOLESTEROL BY NMR: 61 mg/dL (ref >39)
HDL PARTICLE NUMBER: 30.4 umol/L — AB (ref >=30.5)
LDL Particle Number: 1043 nmol/L — ABNORMAL HIGH (ref <1000)
LDL Size: 21.3 nm (ref >20.5)
LDL-C: 104 mg/dL — ABNORMAL HIGH (ref 0–99)
LP-IR Score: 27 (ref <=45)
Small LDL Particle Number: 296 nmol/L (ref <=527)
TRIGLYCERIDES BY NMR: 109 mg/dL (ref 0–149)

## 2015-06-09 LAB — THYROID PANEL WITH TSH
Free Thyroxine Index: 2.3 (ref 1.2–4.9)
T3 UPTAKE RATIO: 27 % (ref 24–39)
T4 TOTAL: 8.4 ug/dL (ref 4.5–12.0)
TSH: 0.599 u[IU]/mL (ref 0.450–4.500)

## 2015-06-10 DIAGNOSIS — N400 Enlarged prostate without lower urinary tract symptoms: Secondary | ICD-10-CM | POA: Diagnosis not present

## 2015-06-10 DIAGNOSIS — E785 Hyperlipidemia, unspecified: Secondary | ICD-10-CM | POA: Diagnosis not present

## 2015-06-10 DIAGNOSIS — M5416 Radiculopathy, lumbar region: Secondary | ICD-10-CM | POA: Diagnosis not present

## 2015-06-10 DIAGNOSIS — F0280 Dementia in other diseases classified elsewhere without behavioral disturbance: Secondary | ICD-10-CM | POA: Diagnosis not present

## 2015-06-10 DIAGNOSIS — E039 Hypothyroidism, unspecified: Secondary | ICD-10-CM | POA: Diagnosis not present

## 2015-06-10 DIAGNOSIS — Z87891 Personal history of nicotine dependence: Secondary | ICD-10-CM | POA: Diagnosis not present

## 2015-06-10 DIAGNOSIS — N433 Hydrocele, unspecified: Secondary | ICD-10-CM | POA: Diagnosis not present

## 2015-06-10 DIAGNOSIS — E559 Vitamin D deficiency, unspecified: Secondary | ICD-10-CM | POA: Diagnosis not present

## 2015-06-10 DIAGNOSIS — G309 Alzheimer's disease, unspecified: Secondary | ICD-10-CM | POA: Diagnosis not present

## 2015-06-13 DIAGNOSIS — N433 Hydrocele, unspecified: Secondary | ICD-10-CM | POA: Diagnosis not present

## 2015-06-13 DIAGNOSIS — E039 Hypothyroidism, unspecified: Secondary | ICD-10-CM | POA: Diagnosis not present

## 2015-06-13 DIAGNOSIS — F0280 Dementia in other diseases classified elsewhere without behavioral disturbance: Secondary | ICD-10-CM | POA: Diagnosis not present

## 2015-06-13 DIAGNOSIS — G309 Alzheimer's disease, unspecified: Secondary | ICD-10-CM | POA: Diagnosis not present

## 2015-06-13 DIAGNOSIS — N400 Enlarged prostate without lower urinary tract symptoms: Secondary | ICD-10-CM | POA: Diagnosis not present

## 2015-06-13 DIAGNOSIS — M5416 Radiculopathy, lumbar region: Secondary | ICD-10-CM | POA: Diagnosis not present

## 2015-06-15 DIAGNOSIS — N433 Hydrocele, unspecified: Secondary | ICD-10-CM | POA: Diagnosis not present

## 2015-06-15 DIAGNOSIS — G309 Alzheimer's disease, unspecified: Secondary | ICD-10-CM | POA: Diagnosis not present

## 2015-06-15 DIAGNOSIS — N400 Enlarged prostate without lower urinary tract symptoms: Secondary | ICD-10-CM | POA: Diagnosis not present

## 2015-06-15 DIAGNOSIS — E039 Hypothyroidism, unspecified: Secondary | ICD-10-CM | POA: Diagnosis not present

## 2015-06-15 DIAGNOSIS — M5416 Radiculopathy, lumbar region: Secondary | ICD-10-CM | POA: Diagnosis not present

## 2015-06-15 DIAGNOSIS — N390 Urinary tract infection, site not specified: Secondary | ICD-10-CM | POA: Diagnosis not present

## 2015-06-15 DIAGNOSIS — F0280 Dementia in other diseases classified elsewhere without behavioral disturbance: Secondary | ICD-10-CM | POA: Diagnosis not present

## 2015-06-19 ENCOUNTER — Other Ambulatory Visit: Payer: Self-pay | Admitting: Family Medicine

## 2015-06-19 DIAGNOSIS — N433 Hydrocele, unspecified: Secondary | ICD-10-CM | POA: Diagnosis not present

## 2015-06-19 DIAGNOSIS — F0280 Dementia in other diseases classified elsewhere without behavioral disturbance: Secondary | ICD-10-CM | POA: Diagnosis not present

## 2015-06-19 DIAGNOSIS — G309 Alzheimer's disease, unspecified: Secondary | ICD-10-CM | POA: Diagnosis not present

## 2015-06-19 DIAGNOSIS — E039 Hypothyroidism, unspecified: Secondary | ICD-10-CM | POA: Diagnosis not present

## 2015-06-19 DIAGNOSIS — M5416 Radiculopathy, lumbar region: Secondary | ICD-10-CM | POA: Diagnosis not present

## 2015-06-19 DIAGNOSIS — N400 Enlarged prostate without lower urinary tract symptoms: Secondary | ICD-10-CM | POA: Diagnosis not present

## 2015-06-20 DIAGNOSIS — E039 Hypothyroidism, unspecified: Secondary | ICD-10-CM | POA: Diagnosis not present

## 2015-06-20 DIAGNOSIS — G309 Alzheimer's disease, unspecified: Secondary | ICD-10-CM | POA: Diagnosis not present

## 2015-06-20 DIAGNOSIS — M5416 Radiculopathy, lumbar region: Secondary | ICD-10-CM | POA: Diagnosis not present

## 2015-06-20 DIAGNOSIS — N400 Enlarged prostate without lower urinary tract symptoms: Secondary | ICD-10-CM | POA: Diagnosis not present

## 2015-06-20 DIAGNOSIS — N433 Hydrocele, unspecified: Secondary | ICD-10-CM | POA: Diagnosis not present

## 2015-06-20 DIAGNOSIS — F0280 Dementia in other diseases classified elsewhere without behavioral disturbance: Secondary | ICD-10-CM | POA: Diagnosis not present

## 2015-06-23 DIAGNOSIS — N400 Enlarged prostate without lower urinary tract symptoms: Secondary | ICD-10-CM | POA: Diagnosis not present

## 2015-06-23 DIAGNOSIS — N433 Hydrocele, unspecified: Secondary | ICD-10-CM | POA: Diagnosis not present

## 2015-06-23 DIAGNOSIS — F0280 Dementia in other diseases classified elsewhere without behavioral disturbance: Secondary | ICD-10-CM | POA: Diagnosis not present

## 2015-06-23 DIAGNOSIS — E039 Hypothyroidism, unspecified: Secondary | ICD-10-CM | POA: Diagnosis not present

## 2015-06-23 DIAGNOSIS — G309 Alzheimer's disease, unspecified: Secondary | ICD-10-CM | POA: Diagnosis not present

## 2015-06-23 DIAGNOSIS — M5416 Radiculopathy, lumbar region: Secondary | ICD-10-CM | POA: Diagnosis not present

## 2015-06-26 ENCOUNTER — Encounter: Payer: Self-pay | Admitting: Family Medicine

## 2015-06-26 DIAGNOSIS — G309 Alzheimer's disease, unspecified: Secondary | ICD-10-CM | POA: Diagnosis not present

## 2015-06-26 DIAGNOSIS — N400 Enlarged prostate without lower urinary tract symptoms: Secondary | ICD-10-CM | POA: Diagnosis not present

## 2015-06-26 DIAGNOSIS — E039 Hypothyroidism, unspecified: Secondary | ICD-10-CM | POA: Diagnosis not present

## 2015-06-26 DIAGNOSIS — M5416 Radiculopathy, lumbar region: Secondary | ICD-10-CM | POA: Diagnosis not present

## 2015-06-26 DIAGNOSIS — F0280 Dementia in other diseases classified elsewhere without behavioral disturbance: Secondary | ICD-10-CM | POA: Diagnosis not present

## 2015-06-26 DIAGNOSIS — N433 Hydrocele, unspecified: Secondary | ICD-10-CM | POA: Diagnosis not present

## 2015-06-27 DIAGNOSIS — N433 Hydrocele, unspecified: Secondary | ICD-10-CM | POA: Diagnosis not present

## 2015-06-27 DIAGNOSIS — F0280 Dementia in other diseases classified elsewhere without behavioral disturbance: Secondary | ICD-10-CM | POA: Diagnosis not present

## 2015-06-27 DIAGNOSIS — N400 Enlarged prostate without lower urinary tract symptoms: Secondary | ICD-10-CM | POA: Diagnosis not present

## 2015-06-27 DIAGNOSIS — M5416 Radiculopathy, lumbar region: Secondary | ICD-10-CM | POA: Diagnosis not present

## 2015-06-27 DIAGNOSIS — G309 Alzheimer's disease, unspecified: Secondary | ICD-10-CM | POA: Diagnosis not present

## 2015-06-27 DIAGNOSIS — E039 Hypothyroidism, unspecified: Secondary | ICD-10-CM | POA: Diagnosis not present

## 2015-06-28 DIAGNOSIS — F0280 Dementia in other diseases classified elsewhere without behavioral disturbance: Secondary | ICD-10-CM | POA: Diagnosis not present

## 2015-06-28 DIAGNOSIS — M5416 Radiculopathy, lumbar region: Secondary | ICD-10-CM | POA: Diagnosis not present

## 2015-06-28 DIAGNOSIS — N433 Hydrocele, unspecified: Secondary | ICD-10-CM | POA: Diagnosis not present

## 2015-06-28 DIAGNOSIS — E039 Hypothyroidism, unspecified: Secondary | ICD-10-CM | POA: Diagnosis not present

## 2015-06-28 DIAGNOSIS — G309 Alzheimer's disease, unspecified: Secondary | ICD-10-CM | POA: Diagnosis not present

## 2015-06-28 DIAGNOSIS — N400 Enlarged prostate without lower urinary tract symptoms: Secondary | ICD-10-CM | POA: Diagnosis not present

## 2015-06-29 DIAGNOSIS — F0280 Dementia in other diseases classified elsewhere without behavioral disturbance: Secondary | ICD-10-CM | POA: Diagnosis not present

## 2015-06-29 DIAGNOSIS — N433 Hydrocele, unspecified: Secondary | ICD-10-CM | POA: Diagnosis not present

## 2015-06-29 DIAGNOSIS — M5416 Radiculopathy, lumbar region: Secondary | ICD-10-CM | POA: Diagnosis not present

## 2015-06-29 DIAGNOSIS — N400 Enlarged prostate without lower urinary tract symptoms: Secondary | ICD-10-CM | POA: Diagnosis not present

## 2015-06-29 DIAGNOSIS — E039 Hypothyroidism, unspecified: Secondary | ICD-10-CM | POA: Diagnosis not present

## 2015-06-29 DIAGNOSIS — G309 Alzheimer's disease, unspecified: Secondary | ICD-10-CM | POA: Diagnosis not present

## 2015-06-30 ENCOUNTER — Telehealth: Payer: Self-pay | Admitting: Family Medicine

## 2015-07-01 NOTE — Telephone Encounter (Signed)
Why??---- family should have insisted

## 2015-07-03 DIAGNOSIS — G309 Alzheimer's disease, unspecified: Secondary | ICD-10-CM | POA: Diagnosis not present

## 2015-07-03 DIAGNOSIS — M5416 Radiculopathy, lumbar region: Secondary | ICD-10-CM | POA: Diagnosis not present

## 2015-07-03 DIAGNOSIS — N433 Hydrocele, unspecified: Secondary | ICD-10-CM | POA: Diagnosis not present

## 2015-07-03 DIAGNOSIS — E039 Hypothyroidism, unspecified: Secondary | ICD-10-CM | POA: Diagnosis not present

## 2015-07-03 DIAGNOSIS — N400 Enlarged prostate without lower urinary tract symptoms: Secondary | ICD-10-CM | POA: Diagnosis not present

## 2015-07-03 DIAGNOSIS — F0280 Dementia in other diseases classified elsewhere without behavioral disturbance: Secondary | ICD-10-CM | POA: Diagnosis not present

## 2015-07-04 DIAGNOSIS — N433 Hydrocele, unspecified: Secondary | ICD-10-CM | POA: Diagnosis not present

## 2015-07-04 DIAGNOSIS — G309 Alzheimer's disease, unspecified: Secondary | ICD-10-CM | POA: Diagnosis not present

## 2015-07-04 DIAGNOSIS — E039 Hypothyroidism, unspecified: Secondary | ICD-10-CM | POA: Diagnosis not present

## 2015-07-04 DIAGNOSIS — M5416 Radiculopathy, lumbar region: Secondary | ICD-10-CM | POA: Diagnosis not present

## 2015-07-04 DIAGNOSIS — F0280 Dementia in other diseases classified elsewhere without behavioral disturbance: Secondary | ICD-10-CM | POA: Diagnosis not present

## 2015-07-04 DIAGNOSIS — N400 Enlarged prostate without lower urinary tract symptoms: Secondary | ICD-10-CM | POA: Diagnosis not present

## 2015-07-05 DIAGNOSIS — E039 Hypothyroidism, unspecified: Secondary | ICD-10-CM | POA: Diagnosis not present

## 2015-07-05 DIAGNOSIS — F0280 Dementia in other diseases classified elsewhere without behavioral disturbance: Secondary | ICD-10-CM | POA: Diagnosis not present

## 2015-07-05 DIAGNOSIS — G309 Alzheimer's disease, unspecified: Secondary | ICD-10-CM | POA: Diagnosis not present

## 2015-07-05 DIAGNOSIS — M5416 Radiculopathy, lumbar region: Secondary | ICD-10-CM | POA: Diagnosis not present

## 2015-07-05 DIAGNOSIS — N433 Hydrocele, unspecified: Secondary | ICD-10-CM | POA: Diagnosis not present

## 2015-07-05 DIAGNOSIS — N400 Enlarged prostate without lower urinary tract symptoms: Secondary | ICD-10-CM | POA: Diagnosis not present

## 2015-07-05 NOTE — Telephone Encounter (Signed)
Left message for patient's family to call back regarding patient refusing appointment for today.

## 2015-07-07 DIAGNOSIS — E039 Hypothyroidism, unspecified: Secondary | ICD-10-CM | POA: Diagnosis not present

## 2015-07-07 DIAGNOSIS — N400 Enlarged prostate without lower urinary tract symptoms: Secondary | ICD-10-CM | POA: Diagnosis not present

## 2015-07-07 DIAGNOSIS — M5416 Radiculopathy, lumbar region: Secondary | ICD-10-CM | POA: Diagnosis not present

## 2015-07-07 DIAGNOSIS — N433 Hydrocele, unspecified: Secondary | ICD-10-CM | POA: Diagnosis not present

## 2015-07-07 DIAGNOSIS — G309 Alzheimer's disease, unspecified: Secondary | ICD-10-CM | POA: Diagnosis not present

## 2015-07-07 DIAGNOSIS — F0280 Dementia in other diseases classified elsewhere without behavioral disturbance: Secondary | ICD-10-CM | POA: Diagnosis not present

## 2015-07-07 NOTE — Telephone Encounter (Signed)
Attempted to call multiple times.  This encounter will now be closed

## 2015-07-10 DIAGNOSIS — N433 Hydrocele, unspecified: Secondary | ICD-10-CM | POA: Diagnosis not present

## 2015-07-10 DIAGNOSIS — M5416 Radiculopathy, lumbar region: Secondary | ICD-10-CM | POA: Diagnosis not present

## 2015-07-10 DIAGNOSIS — G309 Alzheimer's disease, unspecified: Secondary | ICD-10-CM | POA: Diagnosis not present

## 2015-07-10 DIAGNOSIS — E039 Hypothyroidism, unspecified: Secondary | ICD-10-CM | POA: Diagnosis not present

## 2015-07-10 DIAGNOSIS — N400 Enlarged prostate without lower urinary tract symptoms: Secondary | ICD-10-CM | POA: Diagnosis not present

## 2015-07-10 DIAGNOSIS — F0280 Dementia in other diseases classified elsewhere without behavioral disturbance: Secondary | ICD-10-CM | POA: Diagnosis not present

## 2015-07-12 ENCOUNTER — Encounter (INDEPENDENT_AMBULATORY_CARE_PROVIDER_SITE_OTHER): Payer: Medicare Other | Admitting: Family Medicine

## 2015-07-12 DIAGNOSIS — G309 Alzheimer's disease, unspecified: Secondary | ICD-10-CM

## 2015-07-12 DIAGNOSIS — N400 Enlarged prostate without lower urinary tract symptoms: Secondary | ICD-10-CM | POA: Diagnosis not present

## 2015-07-12 DIAGNOSIS — M5416 Radiculopathy, lumbar region: Secondary | ICD-10-CM | POA: Diagnosis not present

## 2015-07-12 DIAGNOSIS — N433 Hydrocele, unspecified: Secondary | ICD-10-CM | POA: Diagnosis not present

## 2015-07-12 DIAGNOSIS — F0280 Dementia in other diseases classified elsewhere without behavioral disturbance: Secondary | ICD-10-CM

## 2015-07-12 DIAGNOSIS — E039 Hypothyroidism, unspecified: Secondary | ICD-10-CM | POA: Diagnosis not present

## 2015-07-13 DIAGNOSIS — M5416 Radiculopathy, lumbar region: Secondary | ICD-10-CM | POA: Diagnosis not present

## 2015-07-13 DIAGNOSIS — G309 Alzheimer's disease, unspecified: Secondary | ICD-10-CM | POA: Diagnosis not present

## 2015-07-13 DIAGNOSIS — F0280 Dementia in other diseases classified elsewhere without behavioral disturbance: Secondary | ICD-10-CM | POA: Diagnosis not present

## 2015-07-13 DIAGNOSIS — E039 Hypothyroidism, unspecified: Secondary | ICD-10-CM | POA: Diagnosis not present

## 2015-07-13 DIAGNOSIS — N433 Hydrocele, unspecified: Secondary | ICD-10-CM | POA: Diagnosis not present

## 2015-07-13 DIAGNOSIS — N400 Enlarged prostate without lower urinary tract symptoms: Secondary | ICD-10-CM | POA: Diagnosis not present

## 2015-07-14 DIAGNOSIS — M5416 Radiculopathy, lumbar region: Secondary | ICD-10-CM | POA: Diagnosis not present

## 2015-07-14 DIAGNOSIS — F0280 Dementia in other diseases classified elsewhere without behavioral disturbance: Secondary | ICD-10-CM | POA: Diagnosis not present

## 2015-07-14 DIAGNOSIS — N400 Enlarged prostate without lower urinary tract symptoms: Secondary | ICD-10-CM | POA: Diagnosis not present

## 2015-07-14 DIAGNOSIS — N433 Hydrocele, unspecified: Secondary | ICD-10-CM | POA: Diagnosis not present

## 2015-07-14 DIAGNOSIS — E039 Hypothyroidism, unspecified: Secondary | ICD-10-CM | POA: Diagnosis not present

## 2015-07-14 DIAGNOSIS — G309 Alzheimer's disease, unspecified: Secondary | ICD-10-CM | POA: Diagnosis not present

## 2015-07-19 DIAGNOSIS — M5416 Radiculopathy, lumbar region: Secondary | ICD-10-CM | POA: Diagnosis not present

## 2015-07-19 DIAGNOSIS — F0280 Dementia in other diseases classified elsewhere without behavioral disturbance: Secondary | ICD-10-CM | POA: Diagnosis not present

## 2015-07-19 DIAGNOSIS — N400 Enlarged prostate without lower urinary tract symptoms: Secondary | ICD-10-CM | POA: Diagnosis not present

## 2015-07-19 DIAGNOSIS — E039 Hypothyroidism, unspecified: Secondary | ICD-10-CM | POA: Diagnosis not present

## 2015-07-19 DIAGNOSIS — N433 Hydrocele, unspecified: Secondary | ICD-10-CM | POA: Diagnosis not present

## 2015-07-19 DIAGNOSIS — G309 Alzheimer's disease, unspecified: Secondary | ICD-10-CM | POA: Diagnosis not present

## 2015-07-20 DIAGNOSIS — G309 Alzheimer's disease, unspecified: Secondary | ICD-10-CM | POA: Diagnosis not present

## 2015-07-20 DIAGNOSIS — N400 Enlarged prostate without lower urinary tract symptoms: Secondary | ICD-10-CM | POA: Diagnosis not present

## 2015-07-20 DIAGNOSIS — F0280 Dementia in other diseases classified elsewhere without behavioral disturbance: Secondary | ICD-10-CM | POA: Diagnosis not present

## 2015-07-20 DIAGNOSIS — E039 Hypothyroidism, unspecified: Secondary | ICD-10-CM | POA: Diagnosis not present

## 2015-07-20 DIAGNOSIS — M5416 Radiculopathy, lumbar region: Secondary | ICD-10-CM | POA: Diagnosis not present

## 2015-07-20 DIAGNOSIS — N433 Hydrocele, unspecified: Secondary | ICD-10-CM | POA: Diagnosis not present

## 2015-07-21 DIAGNOSIS — F0280 Dementia in other diseases classified elsewhere without behavioral disturbance: Secondary | ICD-10-CM | POA: Diagnosis not present

## 2015-07-21 DIAGNOSIS — E039 Hypothyroidism, unspecified: Secondary | ICD-10-CM | POA: Diagnosis not present

## 2015-07-21 DIAGNOSIS — G309 Alzheimer's disease, unspecified: Secondary | ICD-10-CM | POA: Diagnosis not present

## 2015-07-21 DIAGNOSIS — N433 Hydrocele, unspecified: Secondary | ICD-10-CM | POA: Diagnosis not present

## 2015-07-21 DIAGNOSIS — N400 Enlarged prostate without lower urinary tract symptoms: Secondary | ICD-10-CM | POA: Diagnosis not present

## 2015-07-21 DIAGNOSIS — M5416 Radiculopathy, lumbar region: Secondary | ICD-10-CM | POA: Diagnosis not present

## 2015-07-26 DIAGNOSIS — M5416 Radiculopathy, lumbar region: Secondary | ICD-10-CM | POA: Diagnosis not present

## 2015-07-26 DIAGNOSIS — E039 Hypothyroidism, unspecified: Secondary | ICD-10-CM | POA: Diagnosis not present

## 2015-07-26 DIAGNOSIS — N433 Hydrocele, unspecified: Secondary | ICD-10-CM | POA: Diagnosis not present

## 2015-07-26 DIAGNOSIS — G309 Alzheimer's disease, unspecified: Secondary | ICD-10-CM | POA: Diagnosis not present

## 2015-07-26 DIAGNOSIS — F0280 Dementia in other diseases classified elsewhere without behavioral disturbance: Secondary | ICD-10-CM | POA: Diagnosis not present

## 2015-07-26 DIAGNOSIS — N400 Enlarged prostate without lower urinary tract symptoms: Secondary | ICD-10-CM | POA: Diagnosis not present

## 2015-07-28 DIAGNOSIS — E039 Hypothyroidism, unspecified: Secondary | ICD-10-CM | POA: Diagnosis not present

## 2015-07-28 DIAGNOSIS — N400 Enlarged prostate without lower urinary tract symptoms: Secondary | ICD-10-CM | POA: Diagnosis not present

## 2015-07-28 DIAGNOSIS — F0280 Dementia in other diseases classified elsewhere without behavioral disturbance: Secondary | ICD-10-CM | POA: Diagnosis not present

## 2015-07-28 DIAGNOSIS — N433 Hydrocele, unspecified: Secondary | ICD-10-CM | POA: Diagnosis not present

## 2015-07-28 DIAGNOSIS — M5416 Radiculopathy, lumbar region: Secondary | ICD-10-CM | POA: Diagnosis not present

## 2015-07-28 DIAGNOSIS — G309 Alzheimer's disease, unspecified: Secondary | ICD-10-CM | POA: Diagnosis not present

## 2015-07-31 DIAGNOSIS — F0280 Dementia in other diseases classified elsewhere without behavioral disturbance: Secondary | ICD-10-CM | POA: Diagnosis not present

## 2015-07-31 DIAGNOSIS — E039 Hypothyroidism, unspecified: Secondary | ICD-10-CM | POA: Diagnosis not present

## 2015-07-31 DIAGNOSIS — N433 Hydrocele, unspecified: Secondary | ICD-10-CM | POA: Diagnosis not present

## 2015-07-31 DIAGNOSIS — N400 Enlarged prostate without lower urinary tract symptoms: Secondary | ICD-10-CM | POA: Diagnosis not present

## 2015-07-31 DIAGNOSIS — G309 Alzheimer's disease, unspecified: Secondary | ICD-10-CM | POA: Diagnosis not present

## 2015-07-31 DIAGNOSIS — M5416 Radiculopathy, lumbar region: Secondary | ICD-10-CM | POA: Diagnosis not present

## 2015-08-02 DIAGNOSIS — M5416 Radiculopathy, lumbar region: Secondary | ICD-10-CM | POA: Diagnosis not present

## 2015-08-02 DIAGNOSIS — N400 Enlarged prostate without lower urinary tract symptoms: Secondary | ICD-10-CM | POA: Diagnosis not present

## 2015-08-02 DIAGNOSIS — F0280 Dementia in other diseases classified elsewhere without behavioral disturbance: Secondary | ICD-10-CM | POA: Diagnosis not present

## 2015-08-02 DIAGNOSIS — N433 Hydrocele, unspecified: Secondary | ICD-10-CM | POA: Diagnosis not present

## 2015-08-02 DIAGNOSIS — E039 Hypothyroidism, unspecified: Secondary | ICD-10-CM | POA: Diagnosis not present

## 2015-08-02 DIAGNOSIS — G309 Alzheimer's disease, unspecified: Secondary | ICD-10-CM | POA: Diagnosis not present

## 2015-08-04 DIAGNOSIS — G309 Alzheimer's disease, unspecified: Secondary | ICD-10-CM | POA: Diagnosis not present

## 2015-08-04 DIAGNOSIS — N400 Enlarged prostate without lower urinary tract symptoms: Secondary | ICD-10-CM | POA: Diagnosis not present

## 2015-08-04 DIAGNOSIS — F0280 Dementia in other diseases classified elsewhere without behavioral disturbance: Secondary | ICD-10-CM | POA: Diagnosis not present

## 2015-08-04 DIAGNOSIS — M5416 Radiculopathy, lumbar region: Secondary | ICD-10-CM | POA: Diagnosis not present

## 2015-08-04 DIAGNOSIS — E039 Hypothyroidism, unspecified: Secondary | ICD-10-CM | POA: Diagnosis not present

## 2015-08-04 DIAGNOSIS — N433 Hydrocele, unspecified: Secondary | ICD-10-CM | POA: Diagnosis not present

## 2015-08-08 ENCOUNTER — Other Ambulatory Visit: Payer: Self-pay | Admitting: Family Medicine

## 2015-08-15 ENCOUNTER — Other Ambulatory Visit: Payer: Self-pay | Admitting: Family Medicine

## 2015-08-16 ENCOUNTER — Ambulatory Visit (INDEPENDENT_AMBULATORY_CARE_PROVIDER_SITE_OTHER): Payer: Medicare Other

## 2015-08-16 DIAGNOSIS — Z23 Encounter for immunization: Secondary | ICD-10-CM | POA: Diagnosis not present

## 2015-08-23 ENCOUNTER — Inpatient Hospital Stay (HOSPITAL_COMMUNITY): Payer: Medicare Other

## 2015-08-23 ENCOUNTER — Telehealth: Payer: Self-pay | Admitting: Family Medicine

## 2015-08-23 ENCOUNTER — Encounter (HOSPITAL_COMMUNITY)
Admission: EM | Disposition: A | Discharge status: Skilled Nursing Facility | Payer: Self-pay | Source: Home / Self Care | Attending: Internal Medicine

## 2015-08-23 ENCOUNTER — Encounter (HOSPITAL_COMMUNITY): Payer: Self-pay | Admitting: Emergency Medicine

## 2015-08-23 ENCOUNTER — Encounter (HOSPITAL_COMMUNITY): Payer: Self-pay | Admitting: Anesthesiology

## 2015-08-23 ENCOUNTER — Inpatient Hospital Stay: Admission: AD | Admit: 2015-08-23 | Payer: Self-pay | Source: Ambulatory Visit | Admitting: Orthopedic Surgery

## 2015-08-23 ENCOUNTER — Inpatient Hospital Stay (HOSPITAL_COMMUNITY): Payer: Medicare Other | Admitting: Certified Registered Nurse Anesthetist

## 2015-08-23 ENCOUNTER — Emergency Department (HOSPITAL_COMMUNITY): Payer: Medicare Other

## 2015-08-23 ENCOUNTER — Inpatient Hospital Stay (HOSPITAL_COMMUNITY)
Admission: EM | Admit: 2015-08-23 | Discharge: 2015-08-26 | DRG: 482 | Disposition: A | Discharge status: Skilled Nursing Facility | Payer: Medicare Other | Attending: Internal Medicine | Admitting: Internal Medicine

## 2015-08-23 DIAGNOSIS — M129 Arthropathy, unspecified: Secondary | ICD-10-CM | POA: Diagnosis not present

## 2015-08-23 DIAGNOSIS — S72142A Displaced intertrochanteric fracture of left femur, initial encounter for closed fracture: Secondary | ICD-10-CM | POA: Diagnosis not present

## 2015-08-23 DIAGNOSIS — H9190 Unspecified hearing loss, unspecified ear: Secondary | ICD-10-CM | POA: Diagnosis present

## 2015-08-23 DIAGNOSIS — R7303 Prediabetes: Secondary | ICD-10-CM | POA: Diagnosis present

## 2015-08-23 DIAGNOSIS — M79605 Pain in left leg: Secondary | ICD-10-CM | POA: Diagnosis not present

## 2015-08-23 DIAGNOSIS — E785 Hyperlipidemia, unspecified: Secondary | ICD-10-CM | POA: Diagnosis present

## 2015-08-23 DIAGNOSIS — E039 Hypothyroidism, unspecified: Secondary | ICD-10-CM | POA: Diagnosis present

## 2015-08-23 DIAGNOSIS — R319 Hematuria, unspecified: Secondary | ICD-10-CM | POA: Diagnosis present

## 2015-08-23 DIAGNOSIS — T148 Other injury of unspecified body region: Secondary | ICD-10-CM | POA: Diagnosis not present

## 2015-08-23 DIAGNOSIS — W010XXA Fall on same level from slipping, tripping and stumbling without subsequent striking against object, initial encounter: Secondary | ICD-10-CM | POA: Diagnosis present

## 2015-08-23 DIAGNOSIS — N183 Chronic kidney disease, stage 3 unspecified: Secondary | ICD-10-CM | POA: Diagnosis present

## 2015-08-23 DIAGNOSIS — D72829 Elevated white blood cell count, unspecified: Secondary | ICD-10-CM | POA: Diagnosis not present

## 2015-08-23 DIAGNOSIS — F0280 Dementia in other diseases classified elsewhere without behavioral disturbance: Secondary | ICD-10-CM | POA: Diagnosis present

## 2015-08-23 DIAGNOSIS — R262 Difficulty in walking, not elsewhere classified: Secondary | ICD-10-CM | POA: Diagnosis not present

## 2015-08-23 DIAGNOSIS — M47812 Spondylosis without myelopathy or radiculopathy, cervical region: Secondary | ICD-10-CM | POA: Diagnosis present

## 2015-08-23 DIAGNOSIS — S72142D Displaced intertrochanteric fracture of left femur, subsequent encounter for closed fracture with routine healing: Secondary | ICD-10-CM | POA: Diagnosis not present

## 2015-08-23 DIAGNOSIS — G629 Polyneuropathy, unspecified: Secondary | ICD-10-CM | POA: Diagnosis present

## 2015-08-23 DIAGNOSIS — Z79899 Other long term (current) drug therapy: Secondary | ICD-10-CM | POA: Diagnosis not present

## 2015-08-23 DIAGNOSIS — M6281 Muscle weakness (generalized): Secondary | ICD-10-CM | POA: Diagnosis not present

## 2015-08-23 DIAGNOSIS — Y92009 Unspecified place in unspecified non-institutional (private) residence as the place of occurrence of the external cause: Secondary | ICD-10-CM | POA: Diagnosis not present

## 2015-08-23 DIAGNOSIS — D649 Anemia, unspecified: Secondary | ICD-10-CM | POA: Diagnosis not present

## 2015-08-23 DIAGNOSIS — G309 Alzheimer's disease, unspecified: Secondary | ICD-10-CM | POA: Diagnosis present

## 2015-08-23 DIAGNOSIS — W19XXXA Unspecified fall, initial encounter: Secondary | ICD-10-CM

## 2015-08-23 DIAGNOSIS — G5790 Unspecified mononeuropathy of unspecified lower limb: Secondary | ICD-10-CM | POA: Diagnosis present

## 2015-08-23 DIAGNOSIS — M542 Cervicalgia: Secondary | ICD-10-CM | POA: Diagnosis not present

## 2015-08-23 DIAGNOSIS — R259 Unspecified abnormal involuntary movements: Secondary | ICD-10-CM | POA: Diagnosis not present

## 2015-08-23 DIAGNOSIS — R278 Other lack of coordination: Secondary | ICD-10-CM | POA: Diagnosis not present

## 2015-08-23 DIAGNOSIS — R011 Cardiac murmur, unspecified: Secondary | ICD-10-CM | POA: Diagnosis present

## 2015-08-23 DIAGNOSIS — S728X2A Other fracture of left femur, initial encounter for closed fracture: Secondary | ICD-10-CM | POA: Diagnosis not present

## 2015-08-23 DIAGNOSIS — Z419 Encounter for procedure for purposes other than remedying health state, unspecified: Secondary | ICD-10-CM

## 2015-08-23 DIAGNOSIS — S72002A Fracture of unspecified part of neck of left femur, initial encounter for closed fracture: Secondary | ICD-10-CM | POA: Diagnosis not present

## 2015-08-23 DIAGNOSIS — R41841 Cognitive communication deficit: Secondary | ICD-10-CM | POA: Diagnosis not present

## 2015-08-23 DIAGNOSIS — M4682 Other specified inflammatory spondylopathies, cervical region: Secondary | ICD-10-CM | POA: Diagnosis not present

## 2015-08-23 DIAGNOSIS — R102 Pelvic and perineal pain: Secondary | ICD-10-CM | POA: Diagnosis not present

## 2015-08-23 DIAGNOSIS — S72009A Fracture of unspecified part of neck of unspecified femur, initial encounter for closed fracture: Secondary | ICD-10-CM

## 2015-08-23 DIAGNOSIS — M179 Osteoarthritis of knee, unspecified: Secondary | ICD-10-CM | POA: Diagnosis not present

## 2015-08-23 DIAGNOSIS — M25552 Pain in left hip: Secondary | ICD-10-CM | POA: Diagnosis not present

## 2015-08-23 HISTORY — DX: Functional urinary incontinence: R39.81

## 2015-08-23 HISTORY — DX: Other specified symptoms and signs involving the circulatory and respiratory systems: R09.89

## 2015-08-23 HISTORY — PX: INTRAMEDULLARY (IM) NAIL INTERTROCHANTERIC: SHX5875

## 2015-08-23 HISTORY — DX: Unspecified mononeuropathy of unspecified lower limb: G57.90

## 2015-08-23 HISTORY — DX: Chronic kidney disease, unspecified: N18.9

## 2015-08-23 HISTORY — DX: Hypothyroidism, unspecified: E03.9

## 2015-08-23 HISTORY — DX: Displaced intertrochanteric fracture of left femur, initial encounter for closed fracture: S72.142A

## 2015-08-23 LAB — URINALYSIS, ROUTINE W REFLEX MICROSCOPIC
Bilirubin Urine: NEGATIVE
GLUCOSE, UA: NEGATIVE mg/dL
Ketones, ur: NEGATIVE mg/dL
LEUKOCYTES UA: NEGATIVE
Nitrite: NEGATIVE
PH: 6.5 (ref 5.0–8.0)
Protein, ur: NEGATIVE mg/dL
SPECIFIC GRAVITY, URINE: 1.01 (ref 1.005–1.030)
Urobilinogen, UA: 0.2 mg/dL (ref 0.0–1.0)

## 2015-08-23 LAB — BASIC METABOLIC PANEL
ANION GAP: 9 (ref 5–15)
BUN: 22 mg/dL — ABNORMAL HIGH (ref 6–20)
CHLORIDE: 102 mmol/L (ref 101–111)
CO2: 26 mmol/L (ref 22–32)
Calcium: 9.3 mg/dL (ref 8.9–10.3)
Creatinine, Ser: 1.43 mg/dL — ABNORMAL HIGH (ref 0.61–1.24)
GFR calc non Af Amer: 42 mL/min — ABNORMAL LOW (ref >60)
GFR, EST AFRICAN AMERICAN: 49 mL/min — AB (ref >60)
Glucose, Bld: 140 mg/dL — ABNORMAL HIGH (ref 65–99)
POTASSIUM: 3.7 mmol/L (ref 3.5–5.1)
Sodium: 137 mmol/L (ref 135–145)

## 2015-08-23 LAB — CBC WITH DIFFERENTIAL/PLATELET
Basophils Absolute: 0 10*3/uL (ref 0.0–0.1)
Basophils Relative: 0 %
EOS PCT: 1 %
Eosinophils Absolute: 0.1 10*3/uL (ref 0.0–0.7)
HCT: 41.1 % (ref 39.0–52.0)
HEMOGLOBIN: 14 g/dL (ref 13.0–17.0)
Lymphocytes Relative: 13 %
Lymphs Abs: 2 10*3/uL (ref 0.7–4.0)
MCH: 32.5 pg (ref 26.0–34.0)
MCHC: 34.1 g/dL (ref 30.0–36.0)
MCV: 95.4 fL (ref 78.0–100.0)
Monocytes Absolute: 1.4 10*3/uL — ABNORMAL HIGH (ref 0.1–1.0)
Monocytes Relative: 9 %
NEUTROS ABS: 11.7 10*3/uL — AB (ref 1.7–7.7)
NEUTROS PCT: 77 %
Platelets: 185 10*3/uL (ref 150–400)
RBC: 4.31 MIL/uL (ref 4.22–5.81)
RDW: 12.8 % (ref 11.5–15.5)
WBC: 15.2 10*3/uL — AB (ref 4.0–10.5)

## 2015-08-23 LAB — SURGICAL PCR SCREEN
MRSA, PCR: NEGATIVE
STAPHYLOCOCCUS AUREUS: POSITIVE — AB

## 2015-08-23 LAB — URINE MICROSCOPIC-ADD ON

## 2015-08-23 LAB — PROTIME-INR
INR: 1.11 (ref 0.00–1.49)
Prothrombin Time: 14.5 seconds (ref 11.6–15.2)

## 2015-08-23 LAB — TSH: TSH: 1.555 u[IU]/mL (ref 0.350–4.500)

## 2015-08-23 LAB — TYPE AND SCREEN
ABO/RH(D): O NEG
ANTIBODY SCREEN: NEGATIVE

## 2015-08-23 SURGERY — FIXATION, FRACTURE, INTERTROCHANTERIC, WITH INTRAMEDULLARY ROD
Anesthesia: General | Site: Hip | Laterality: Left

## 2015-08-23 SURGERY — ARTHROPLASTY, HIP, TOTAL, ANTERIOR APPROACH
Anesthesia: General | Laterality: Left

## 2015-08-23 MED ORDER — MORPHINE SULFATE (PF) 2 MG/ML IV SOLN: 0.5000 mg | INTRAVENOUS | Status: DC | PRN

## 2015-08-23 MED ORDER — ONDANSETRON HCL 4 MG/2ML IJ SOLN
INTRAMUSCULAR | Status: DC | PRN
  Administered 2015-08-23: 4 mg via INTRAVENOUS

## 2015-08-23 MED ORDER — FENTANYL CITRATE (PF) 100 MCG/2ML IJ SOLN: 25.0000 ug | INTRAMUSCULAR | Status: DC | PRN

## 2015-08-23 MED ORDER — CEFAZOLIN SODIUM-DEXTROSE 2-3 GM-% IV SOLR
2.0000 g | Freq: Three times a day (TID) | INTRAVENOUS | Status: DC
  Administered 2015-08-23 – 2015-08-26 (×8): 2 g via INTRAVENOUS
  Filled 2015-08-23 (×9): qty 50

## 2015-08-23 MED ORDER — LABETALOL HCL 5 MG/ML IV SOLN
INTRAVENOUS | Status: DC | PRN
  Administered 2015-08-23: 5 mg via INTRAVENOUS

## 2015-08-23 MED ORDER — ACETAMINOPHEN 650 MG RE SUPP: 650.0000 mg | Freq: Four times a day (QID) | RECTAL | Status: DC | PRN

## 2015-08-23 MED ORDER — 0.9 % SODIUM CHLORIDE (POUR BTL) OPTIME
TOPICAL | Status: DC | PRN
  Administered 2015-08-23: 1000 mL

## 2015-08-23 MED ORDER — ALBUTEROL SULFATE (2.5 MG/3ML) 0.083% IN NEBU: 2.5000 mg | INHALATION_SOLUTION | RESPIRATORY_TRACT | Status: DC | PRN

## 2015-08-23 MED ORDER — CEFAZOLIN SODIUM-DEXTROSE 2-3 GM-% IV SOLR
INTRAVENOUS | Status: AC
  Filled 2015-08-23: qty 50

## 2015-08-23 MED ORDER — VITAMIN D3 25 MCG (1000 UNIT) PO TABS
2000.0000 [IU] | ORAL_TABLET | Freq: Every day | ORAL | Status: DC
  Administered 2015-08-24 – 2015-08-26 (×3): 2000 [IU] via ORAL
  Filled 2015-08-23 (×4): qty 2

## 2015-08-23 MED ORDER — SODIUM CHLORIDE 0.9 % IV SOLN
INTRAVENOUS | Status: DC | PRN
  Administered 2015-08-23: 22:00:00 via INTRAVENOUS

## 2015-08-23 MED ORDER — SUCCINYLCHOLINE CHLORIDE 20 MG/ML IJ SOLN
INTRAMUSCULAR | Status: DC | PRN
  Administered 2015-08-23: 80 mg via INTRAVENOUS

## 2015-08-23 MED ORDER — MORPHINE SULFATE (PF) 2 MG/ML IV SOLN
2.0000 mg | Freq: Once | INTRAVENOUS | Status: AC
  Administered 2015-08-23: 2 mg via INTRAVENOUS
  Filled 2015-08-23: qty 1

## 2015-08-23 MED ORDER — FENTANYL CITRATE (PF) 100 MCG/2ML IJ SOLN
INTRAMUSCULAR | Status: DC | PRN
  Administered 2015-08-23 (×3): 50 ug via INTRAVENOUS

## 2015-08-23 MED ORDER — POTASSIUM CHLORIDE IN NACL 20-0.9 MEQ/L-% IV SOLN
INTRAVENOUS | Status: DC
  Administered 2015-08-23: 09:00:00 via INTRAVENOUS
  Filled 2015-08-23 (×2): qty 1000

## 2015-08-23 MED ORDER — DONEPEZIL HCL 10 MG PO TABS
10.0000 mg | ORAL_TABLET | Freq: Every day | ORAL | Status: DC
  Administered 2015-08-24 – 2015-08-26 (×3): 10 mg via ORAL
  Filled 2015-08-23 (×4): qty 1

## 2015-08-23 MED ORDER — FENTANYL CITRATE (PF) 100 MCG/2ML IJ SOLN
INTRAMUSCULAR | Status: AC
  Filled 2015-08-23: qty 4

## 2015-08-23 MED ORDER — LACTATED RINGERS IV SOLN
INTRAVENOUS | Status: DC | PRN
  Administered 2015-08-23: 21:00:00 via INTRAVENOUS

## 2015-08-23 MED ORDER — LEVOTHYROXINE SODIUM 75 MCG PO TABS
75.0000 ug | ORAL_TABLET | Freq: Every day | ORAL | Status: DC
  Administered 2015-08-24 – 2015-08-26 (×4): 75 ug via ORAL
  Filled 2015-08-23 (×5): qty 1

## 2015-08-23 MED ORDER — HYDROMORPHONE HCL 1 MG/ML IJ SOLN: 0.2500 mg | INTRAMUSCULAR | Status: DC | PRN

## 2015-08-23 MED ORDER — MEMANTINE HCL ER 28 MG PO CP24
28.0000 mg | ORAL_CAPSULE | Freq: Every day | ORAL | Status: DC
  Administered 2015-08-24 – 2015-08-26 (×4): 28 mg via ORAL
  Filled 2015-08-23 (×4): qty 1

## 2015-08-23 MED ORDER — ISOPROPYL ALCOHOL 70 % SOLN
Status: AC
  Filled 2015-08-23: qty 480

## 2015-08-23 MED ORDER — PROPOFOL 10 MG/ML IV BOLUS
INTRAVENOUS | Status: AC
  Filled 2015-08-23: qty 20

## 2015-08-23 MED ORDER — ONDANSETRON HCL 4 MG PO TABS: 4.0000 mg | ORAL_TABLET | Freq: Four times a day (QID) | ORAL | Status: DC | PRN

## 2015-08-23 MED ORDER — SODIUM CHLORIDE 0.9 % IV SOLN
10.0000 mg | INTRAVENOUS | Status: DC | PRN
  Administered 2015-08-23: 40 ug/min via INTRAVENOUS

## 2015-08-23 MED ORDER — LACTATED RINGERS IV SOLN: INTRAVENOUS | Status: DC

## 2015-08-23 MED ORDER — LIDOCAINE HCL (CARDIAC) 20 MG/ML IV SOLN
INTRAVENOUS | Status: DC | PRN
  Administered 2015-08-23: 50 mg via INTRAVENOUS

## 2015-08-23 MED ORDER — GABAPENTIN 400 MG PO CAPS
400.0000 mg | ORAL_CAPSULE | Freq: Every day | ORAL | Status: DC
  Filled 2015-08-23 (×2): qty 1

## 2015-08-23 MED ORDER — HYDROCODONE-ACETAMINOPHEN 5-325 MG PO TABS: 1.0000 | ORAL_TABLET | ORAL | Status: DC | PRN

## 2015-08-23 MED ORDER — SENNA 8.6 MG PO TABS
1.0000 | ORAL_TABLET | Freq: Every day | ORAL | Status: DC
  Administered 2015-08-24 – 2015-08-25 (×2): 8.6 mg via ORAL

## 2015-08-23 MED ORDER — SODIUM CHLORIDE 0.9 % IV BOLUS (SEPSIS)
500.0000 mL | Freq: Once | INTRAVENOUS | Status: AC
  Administered 2015-08-23: 500 mL via INTRAVENOUS

## 2015-08-23 MED ORDER — LABETALOL HCL 5 MG/ML IV SOLN
INTRAVENOUS | Status: AC
  Filled 2015-08-23: qty 4

## 2015-08-23 MED ORDER — ACETAMINOPHEN 325 MG PO TABS: 650.0000 mg | ORAL_TABLET | Freq: Four times a day (QID) | ORAL | Status: DC | PRN

## 2015-08-23 MED ORDER — PROPOFOL 10 MG/ML IV BOLUS
INTRAVENOUS | Status: DC | PRN
  Administered 2015-08-23: 90 mg via INTRAVENOUS
  Administered 2015-08-23: 30 mg via INTRAVENOUS

## 2015-08-23 MED ORDER — ONDANSETRON HCL 4 MG/2ML IJ SOLN: 4.0000 mg | Freq: Four times a day (QID) | INTRAMUSCULAR | Status: DC | PRN

## 2015-08-23 SURGICAL SUPPLY — 48 items
BLADE CLIPPER SURG (BLADE) IMPLANT
BLADE SURG 15 STRL LF DISP TIS (BLADE) ×1 IMPLANT
BLADE SURG 15 STRL SS (BLADE) ×2
BNDG GAUZE ELAST 4 BULKY (GAUZE/BANDAGES/DRESSINGS) ×3 IMPLANT
COVER PERINEAL POST (MISCELLANEOUS) ×3 IMPLANT
COVER SURGICAL LIGHT HANDLE (MISCELLANEOUS) ×3 IMPLANT
DRAPE INCISE IOBAN 66X45 STRL (DRAPES) ×3 IMPLANT
DRAPE ORTHO SPLIT 77X108 STRL (DRAPES)
DRAPE PROXIMA HALF (DRAPES) IMPLANT
DRAPE STERI IOBAN 125X83 (DRAPES) ×3 IMPLANT
DRAPE SURG ORHT 6 SPLT 77X108 (DRAPES) IMPLANT
DRSG MEPILEX BORDER 4X4 (GAUZE/BANDAGES/DRESSINGS) ×3 IMPLANT
DRSG MEPILEX BORDER 4X8 (GAUZE/BANDAGES/DRESSINGS) IMPLANT
DRSG TEGADERM 2-3/8X2-3/4 SM (GAUZE/BANDAGES/DRESSINGS) ×3 IMPLANT
DURAPREP 26ML APPLICATOR (WOUND CARE) ×3 IMPLANT
ELECT REM PT RETURN 9FT ADLT (ELECTROSURGICAL) ×3
ELECTRODE REM PT RTRN 9FT ADLT (ELECTROSURGICAL) ×1 IMPLANT
FACESHIELD WRAPAROUND (MASK) IMPLANT
GAUZE XEROFORM 5X9 LF (GAUZE/BANDAGES/DRESSINGS) IMPLANT
GLOVE BIOGEL PI IND STRL 8 (GLOVE) ×1 IMPLANT
GLOVE BIOGEL PI INDICATOR 8 (GLOVE) ×2
GLOVE SURG ORTHO 8.0 STRL STRW (GLOVE) ×3 IMPLANT
GOWN STRL REUS W/ TWL XL LVL3 (GOWN DISPOSABLE) IMPLANT
GOWN STRL REUS W/TWL LRG LVL3 (GOWN DISPOSABLE) ×6 IMPLANT
GOWN STRL REUS W/TWL XL LVL3 (GOWN DISPOSABLE)
GUIDE PIN 3.2MM (MISCELLANEOUS) ×4
GUIDE PIN ORTH 343X3.2XBRAD (MISCELLANEOUS) ×2 IMPLANT
KIT BASIN OR (CUSTOM PROCEDURE TRAY) ×3 IMPLANT
KIT HEADGEAR ORTHO CUSHION (MISCELLANEOUS) ×3 IMPLANT
KIT ROOM TURNOVER OR (KITS) ×3 IMPLANT
MANIFOLD NEPTUNE II (INSTRUMENTS) ×3 IMPLANT
NAIL TRIGEN 10X42CM 125D LT (Nail) ×3 IMPLANT
NS IRRIG 1000ML POUR BTL (IV SOLUTION) ×3 IMPLANT
PACK GENERAL/GYN (CUSTOM PROCEDURE TRAY) ×3 IMPLANT
PAD ARMBOARD 7.5X6 YLW CONV (MISCELLANEOUS) ×6 IMPLANT
SCREW LAG COMPR KIT 110/105 (Screw) ×3 IMPLANT
SOL PREP PROV IODINE SCRUB 4OZ (MISCELLANEOUS) ×3 IMPLANT
SPONGE LAP 4X18 X RAY DECT (DISPOSABLE) IMPLANT
STAPLER VISISTAT 35W (STAPLE) IMPLANT
SUT VIC AB 0 CT1 27 (SUTURE) ×2
SUT VIC AB 0 CT1 27XBRD ANTBC (SUTURE) ×1 IMPLANT
SUT VIC AB 0 CT1 36 (SUTURE) ×6 IMPLANT
SUT VIC AB 2-0 CT1 27 (SUTURE) ×2
SUT VIC AB 2-0 CT1 TAPERPNT 27 (SUTURE) ×1 IMPLANT
SUT VIC AB 2-0 CTB1 (SUTURE) ×3 IMPLANT
TAPE STRIPS DRAPE STRL (GAUZE/BANDAGES/DRESSINGS) IMPLANT
TOWEL OR 17X24 6PK STRL BLUE (TOWEL DISPOSABLE) ×3 IMPLANT
TOWEL OR 17X26 10 PK STRL BLUE (TOWEL DISPOSABLE) ×3 IMPLANT

## 2015-08-23 NOTE — Transfer of Care (Signed)
Immediate Anesthesia Transfer of Care Note  Patient: Joseph [name]  Procedure(s) Performed: Procedure(s): INTRAMEDULLARY (IM) NAIL INTERTROCHANTRIC (Left)  Patient Location: PACU  Anesthesia Type:General  Level of Consciousness: awake, alert , oriented and patient cooperative  Airway & Oxygen Therapy: Patient Spontanous Breathing  Post-op Assessment: Report given to RN, Post -op Vital signs reviewed and stable and Patient moving all extremities X 4  Post vital signs: stable  Last Vitals:  Filed Vitals:   08/23/15 1430  BP: 135/78  Pulse: 116  Temp: 37.1 C  Resp: 16    Complications: No apparent anesthesia complications

## 2015-08-23 NOTE — ED Notes (Addendum)
Consulted Dr. Lendell CapriceSullivan at [facility] and requested to consult [facility] Hospitalist for further reccomendations.Dr. Lendell CapriceSullivan reported would take over care of pt whenever [facility] arrived at [facility]. Dr. Chandra BatchFischer paged.

## 2015-08-23 NOTE — ED Notes (Addendum)
[facility] currently has two beds available. Dr. Chandra BatchFischer reported would contact Dr. August Saucerean and colleagues regarding pt disposition. Dr. Chandra BatchFischer reported would get back with disposition within the hour.

## 2015-08-23 NOTE — ED Notes (Signed)
Per MRI, hospitalist stated that MRI to be completed at [facility].

## 2015-08-23 NOTE — ED Notes (Signed)
Dr .Chandra BatchFischer reported that Dr. August Saucerean approved transfer to [facility]. Dr. Chandra BatchFischer reported would call bed control and find out accepting doctor. Pt family aware.

## 2015-08-23 NOTE — ED Notes (Signed)
Hospitalist at bedside 

## 2015-08-23 NOTE — Telephone Encounter (Signed)
FYI

## 2015-08-23 NOTE — ED Notes (Signed)
Pt tripped and fell going to restroom this am and c/o left shoulder, left knee and hip.

## 2015-08-23 NOTE — ED Notes (Signed)
[facility] reported would be at [facility] in approx 20 minutes.

## 2015-08-23 NOTE — Anesthesia Preprocedure Evaluation (Addendum)
Anesthesia Evaluation  Patient identified by MRN, date of birth, ID band Patient awake    Reviewed: Allergy & Precautions, H&P , NPO status , Patient's Chart, lab work & pertinent test results  Airway Mallampati: II  TM Distance: >3 FB Neck ROM: full    Dental  (+) Dental Advisory Given, Poor Dentition, Chipped, Missing All top front teeth are broken off and decayed:   Pulmonary neg pulmonary ROS, former smoker,    Pulmonary exam normal breath sounds clear to auscultation       Cardiovascular Exercise Tolerance: Good negative cardio ROS Normal cardiovascular exam Rhythm:regular Rate:Normal  ECG - AF   Neuro/Psych Bilateral leg weakness.  Alzheimer's dementia. Cervical spine arthropathy  Neuromuscular disease negative psych ROS   GI/Hepatic negative GI ROS, Neg liver ROS,   Endo/Other  negative endocrine ROSHypothyroidism   Renal/GU Renal diseaseCr 1.4. Stage 3 kidney disease  negative genitourinary   Musculoskeletal   Abdominal   Peds  Hematology negative hematology ROS (+)   Anesthesia Other Findings   Reproductive/Obstetrics negative OB ROS                            Anesthesia Physical Anesthesia Plan  ASA: III  Anesthesia Plan: General   Post-op Pain Management:    Induction: Intravenous  Airway Management Planned: Oral ETT  Additional Equipment:   Intra-op Plan:   Post-operative Plan: Extubation in OR  Informed Consent:   Plan Discussed with: Surgeon  Anesthesia Plan Comments:         Anesthesia Quick Evaluation

## 2015-08-23 NOTE — ED Notes (Signed)
Contacted pt family and informed them of patient room assignment. Pt family verbalized understanding.

## 2015-08-23 NOTE — ED Provider Notes (Signed)
CSN: [id number]     Arrival date & time 08/23/15  0400 History   First MD Initiated Contact with Patient 08/23/15 0406     Chief Complaint  Patient presents with  . Fall     (Consider location/radiation/quality/duration/timing/severity/associated sxs/prior Treatment) HPI  This is an 81 year old male with history of Alzheimer's disease who presents following a fall. Patient reportedly fell while coming out of the bathroom. His wife heard the fall and found him on the floor. Details are unclear regarding the fall. Patient is unable to provide history secondary to his Alzheimer's. He is only oriented to person. Per the wife and daughter, he was complaining of left hip and side pain. No other major medical problems with the exception of thyroid disease. He is a former smoker.  Is not on any blood thinners.  Past Medical History  Diagnosis Date  . Alzheimer disease   . Thyroid disease   . Hyperlipidemia   . Memory disorder    Past Surgical History  Procedure Laterality Date  . Hernia repair     History reviewed. No pertinent family history. Social History  Substance Use Topics  . Smoking status: Former Smoker -- 40 years    Types: Cigarettes, Pipe    Quit date: 04/23/1972  . Smokeless tobacco: Former Neurosurgeon    Types: Chew    Quit date: 04/23/1972  . Alcohol Use: No    Review of Systems  Unable to perform ROS: Dementia      Allergies  Naproxen and Sulfa antibiotics  Home Medications   Prior to Admission medications   Medication Sig Start Date End Date Taking? Authorizing Provider  Cholecalciferol (VITAMIN D3) 2000 UNITS capsule Take 2,000 Units by mouth daily.   Yes Historical Provider, MD  donepezil (ARICEPT) 10 MG tablet TAKE 1 TABLET DAILY 08/08/15  Yes Ernestina Penna, MD  gabapentin (NEURONTIN) 300 MG capsule TAKE 1 CAPSULE IN THE MORNING AND 2 CAPSULES AT BEDTIME 06/19/15  Yes Ernestina Penna, MD  levothyroxine (SYNTHROID, LEVOTHROID) 75 MCG tablet Take 1 tablet (75 mcg  total) by mouth daily. 02/10/15  Yes Ernestina Penna, MD  NAMENDA XR 28 MG CP24 24 hr capsule TAKE (1) CAPSULE DAILY 08/15/15  Yes Ernestina Penna, MD   BP 105/65 mmHg  Pulse 84  Temp(Src) 97.5 F (36.4 C) (Rectal)  Resp 15  Ht 6' (1.829 m)  Wt 150 lb (68.04 kg)  BMI 20.34 kg/m2  SpO2 90% Physical Exam  Constitutional: No distress.  Elderly, no acute distress  HENT:  Head: Normocephalic and atraumatic.  Mouth/Throat: Oropharynx is clear and moist.  Eyes:  Pupils 2 mm reactive bilaterally  Neck: Normal range of motion.  No midline tenderness, step-off, or deformity  Cardiovascular: Normal rate, regular rhythm and normal heart sounds.   No murmur heard. Pulmonary/Chest: Effort normal and breath sounds normal. No respiratory distress. He has no wheezes.  Abdominal: Soft. Bowel sounds are normal. There is no tenderness.  Musculoskeletal: He exhibits no edema.  No obvious deformities, tenderness palpation left hip, mild foreshortening noted, 2+ DP pulse  Neurological: He is alert.  Oriented only to self  Skin: Skin is warm and dry.  Psychiatric: He has a normal mood and affect.  Nursing note and vitals reviewed.   ED Course  Procedures (including critical care time) Labs Review Labs Reviewed  URINALYSIS, ROUTINE W REFLEX MICROSCOPIC (NOT AT [facility]) - Abnormal; Notable for the following:    Hgb urine dipstick LARGE (*)  All other components within normal limits  BASIC METABOLIC PANEL - Abnormal; Notable for the following:    Glucose, Bld 140 (*)    BUN 22 (*)    Creatinine, Ser 1.43 (*)    GFR calc non Af Amer 42 (*)    GFR calc Af Amer 49 (*)    All other components within normal limits  CBC WITH DIFFERENTIAL/PLATELET - Abnormal; Notable for the following:    WBC 15.2 (*)    Neutro Abs 11.7 (*)    Monocytes Absolute 1.4 (*)    All other components within normal limits  URINE MICROSCOPIC-ADD ON  PROTIME-INR  TYPE AND SCREEN    Imaging Review Ct Head Wo  Contrast  08/23/2015  CLINICAL DATA:  Trip and fall injury while going to the restroom this morning. Altered level of consciousness. Alzheimer disease. EXAM: CT HEAD WITHOUT CONTRAST CT CERVICAL SPINE WITHOUT CONTRAST TECHNIQUE: Multidetector CT imaging of the head and cervical spine was performed following the standard protocol without intravenous contrast. Multiplanar CT image reconstructions of the cervical spine were also generated. COMPARISON:  None. FINDINGS: CT HEAD FINDINGS Examination is technically limited due to motion artifact. Diffuse cerebral atrophy. Ventricular dilatation consistent with central atrophy. Patchy low-attenuation changes in the deep white matter consistent with small vessel ischemia. No mass effect or midline shift. No abnormal extra-axial fluid collections. Gray-white matter junctions are distinct. Basal cisterns are not effaced. No evidence of acute intracranial hemorrhage. No depressed skull fractures. Partial opacification or sclerosis in the left mastoid air cells. Right mastoid air cells are not opacified. Mucosal thickening demonstrated in the paranasal sinuses. Vascular calcifications. CT CERVICAL SPINE FINDINGS Diffuse degenerative change throughout the cervical spine with narrowed cervical interspaces and associated endplate hypertrophic changes. There is mild anterior subluxation of C3 on C4 and slight anterior subluxation of C7 on T1. This may be due to degenerative change but ligamentous injury could also have this appearance and is not excluded. Correlation physical examination is recommended. Degenerative changes throughout the cervical facet joints. No vertebral compression deformities. No prevertebral soft tissue swelling. C1-2 articulation appears intact. No focal bone lesion or bone destruction. Consolidation or scarring demonstrated in the lung apices. IMPRESSION: No acute intracranial abnormalities. Chronic atrophy and small vessel ischemic changes. Diffuse  degenerative change throughout the cervical spine. Mild anterior subluxation at C3-4 and C7-T1. These changes may be degenerative but ligamentous injury could also have this appearance and is not excluded. No acute displaced fractures identified. Electronically Signed   By: Burman Nieves M.D.   On: 08/23/2015 05:46   Ct Cervical Spine Wo Contrast  08/23/2015  CLINICAL DATA:  Trip and fall injury while going to the restroom this morning. Altered level of consciousness. Alzheimer disease. EXAM: CT HEAD WITHOUT CONTRAST CT CERVICAL SPINE WITHOUT CONTRAST TECHNIQUE: Multidetector CT imaging of the head and cervical spine was performed following the standard protocol without intravenous contrast. Multiplanar CT image reconstructions of the cervical spine were also generated. COMPARISON:  None. FINDINGS: CT HEAD FINDINGS Examination is technically limited due to motion artifact. Diffuse cerebral atrophy. Ventricular dilatation consistent with central atrophy. Patchy low-attenuation changes in the deep white matter consistent with small vessel ischemia. No mass effect or midline shift. No abnormal extra-axial fluid collections. Gray-white matter junctions are distinct. Basal cisterns are not effaced. No evidence of acute intracranial hemorrhage. No depressed skull fractures. Partial opacification or sclerosis in the left mastoid air cells. Right mastoid air cells are not opacified. Mucosal thickening demonstrated in the paranasal  sinuses. Vascular calcifications. CT CERVICAL SPINE FINDINGS Diffuse degenerative change throughout the cervical spine with narrowed cervical interspaces and associated endplate hypertrophic changes. There is mild anterior subluxation of C3 on C4 and slight anterior subluxation of C7 on T1. This may be due to degenerative change but ligamentous injury could also have this appearance and is not excluded. Correlation physical examination is recommended. Degenerative changes throughout the  cervical facet joints. No vertebral compression deformities. No prevertebral soft tissue swelling. C1-2 articulation appears intact. No focal bone lesion or bone destruction. Consolidation or scarring demonstrated in the lung apices. IMPRESSION: No acute intracranial abnormalities. Chronic atrophy and small vessel ischemic changes. Diffuse degenerative change throughout the cervical spine. Mild anterior subluxation at C3-4 and C7-T1. These changes may be degenerative but ligamentous injury could also have this appearance and is not excluded. No acute displaced fractures identified. Electronically Signed   By: Burman NievesWilliam  Stevens M.D.   On: 08/23/2015 05:46   Dg Knee Complete 4 Views Right  08/23/2015  CLINICAL DATA:  Patient fell on the left side in complains of right knee pain. EXAM: RIGHT KNEE - COMPLETE 4+ VIEW COMPARISON:  None. FINDINGS: Degenerative changes in the right knee with tricompartment narrowing and osteophytosis. Prominent chondrocalcinosis. No significant effusion. No evidence of acute fracture or subluxation. No focal bone lesion or bone destruction. Bone cortex and trabecular architecture appear intact. No radiopaque soft tissue foreign bodies. Vascular calcifications. IMPRESSION: Degenerative changes in the right knee. No acute bony abnormalities. Electronically Signed   By: Burman NievesWilliam  Stevens M.D.   On: 08/23/2015 05:59   Dg Hip Unilat With Pelvis 2-3 Views Left  08/23/2015  CLINICAL DATA:  Patient fell on the left side.  Left hip pain. EXAM: DG HIP (WITH OR WITHOUT PELVIS) 2-3V LEFT COMPARISON:  None. FINDINGS: Acute comminuted posttraumatic inter trochanteric fractures of the left hip with mild varus angulation and impaction of the fracture fragments. Mild displacement of greater and lesser trochanteric fragments. Degenerative changes in the left hip. No evidence of left hip dislocation. No focal bone lesion or bone destruction. Vascular calcifications. Pelvis appears intact. SI joints and  symphysis pubis are not displaced. IMPRESSION: Acute comminuted inter trochanteric fractures in the left hip. Electronically Signed   By: Burman NievesWilliam  Stevens M.D.   On: 08/23/2015 05:58   I have personally reviewed and evaluated these images and lab results as part of my medical decision-making.   EKG Interpretation ED ECG REPORT   Date: 08/23/2015  Rate: 94  Rhythm: normal sinus rhythm  QRS Axis: left  Intervals: normal  ST/T Wave abnormalities: normal  Conduction Disutrbances:right bundle branch block  Narrative Interpretation:   Normal sinus rhythm with right bundle branch block and LAFB       MDM   Final diagnoses:  Closed left hip fracture, initial encounter [facility](HCC)    Patient presents following a fall.  Nontoxic on exam. Oriented only to himself.  Mechanism or cause of fall is unclear at this time.  Screening EKG and urinalysis obtained. Plain films obtained of the left hip and right knee. CT head and neck also obtained as I cannot clear his C-spine by Nexus criteria and is unclear whether he lost consciousness. Plain films of the hip reveal a left intertrochanteric hip fracture. Basic labwork was added. CT head and neck obtained. CT head negative. However, there appears to be some mild subluxation in the C-spine which could represent degenerative changes versus ligamentous injury. Given that I'm unable to clear his C-spine clinically, will  apply an Aspen C collar and patient will need MRI for clearance. Discussed the patient with Dr. August Saucer, orthopedics. Will plan to admit to the hospitalist and transfer to [facility] for surgery.  Of note, patient was given 2 mg of morphine for pain. He was noted to drop his blood pressure after morphine administration into the high 70s. Normal saline bolus was administered and patient blood pressure recovered.    Shon Baton, MD 08/23/15 [id number]

## 2015-08-23 NOTE — Anesthesia Preprocedure Evaluation (Deleted)
Anesthesia Evaluation  Patient identified by MRN, date of birth, ID band Patient awake    Reviewed: Allergy & Precautions, H&P , NPO status , Patient's Chart, lab work & pertinent test results  Airway Mallampati: II  TM Distance: >3 FB Neck ROM: full    Dental no notable dental hx. (+) Dental Advisory Given   Pulmonary neg pulmonary ROS, former smoker,    Pulmonary exam normal breath sounds clear to auscultation       Cardiovascular Exercise Tolerance: Good negative cardio ROS Normal cardiovascular exam Rhythm:regular Rate:Normal  ECG - AF   Neuro/Psych Bilateral leg weakness.  Alzheimer's dementia. Cervical spine arthropathy  Neuromuscular disease negative psych ROS   GI/Hepatic negative GI ROS, Neg liver ROS,   Endo/Other  negative endocrine ROSHypothyroidism   Renal/GU Renal diseaseCr 1.4. Stage 3 kidney disease  negative genitourinary   Musculoskeletal   Abdominal   Peds  Hematology negative hematology ROS (+)   Anesthesia Other Findings   Reproductive/Obstetrics negative OB ROS                            Anesthesia Physical Anesthesia Plan  ASA: III  Anesthesia Plan: General   Post-op Pain Management:    Induction: Intravenous  Airway Management Planned: Oral ETT  Additional Equipment:   Intra-op Plan:   Post-operative Plan: Extubation in OR  Informed Consent: I have reviewed the patients History and Physical, chart, labs and discussed the procedure including the risks, benefits and alternatives for the proposed anesthesia with the patient or authorized representative who has indicated his/her understanding and acceptance.   Dental Advisory Given  Plan Discussed with: CRNA and Surgeon  Anesthesia Plan Comments:         Anesthesia Quick Evaluation

## 2015-08-23 NOTE — Progress Notes (Signed)
ADDENDUM:  The patient is being transferred to [facility] of [facility] due to unavailable [facility] beds at [facility]. The accepting physician has been changed to Dr. Sunnie Nielsenegalado. Dr. August Saucerean was also informed and plans on operative repair later today at [facility]. The patient is hemodynamically stable. He is nothing by mouth. He is on SCDs for DVT prophylaxis.  Of note, cervical spine MRI was done and there were no findings to suggest an acute cervical spine fracture or cord contusion, but he does have significant arthropathy.

## 2015-08-23 NOTE — ED Notes (Signed)
EDP aware of current pending status at [facility]. EDP reported to contact bed control at 10am for update. If no bed available or will be available contact receiving physician for further instructions.

## 2015-08-23 NOTE — ED Notes (Signed)
Fischer reported to contact [facility] and inquire about bed status at [facility]. Dr. Chandra BatchFischer also reported for pt to have MRI at [facility] while waiting.

## 2015-08-23 NOTE — Brief Op Note (Signed)
08/23/2015  11:35 PM  PATIENT:  Joseph [name]  81 y.o. male  PRE-OPERATIVE DIAGNOSIS:  left intertrochanteric fracture  POST-OPERATIVE DIAGNOSIS:  left intertrochanteric fracture  PROCEDURE:  Procedure(s): INTRAMEDULLARY (IM) NAIL INTERTROCHANTRIC  SURGEON:  Surgeon(s): Cammy CopaScott Gregory Dean, MD  ASSISTANT: none  ANESTHESIA:   general  EBL: 200 ml    Total I/O In: 1000 [I.V.:1000] Out: 200 [Urine:75; Blood:125]  BLOOD ADMINISTERED: none  DRAINS: none   LOCAL MEDICATIONS USED:  none  SPECIMEN:  No Specimen  COUNTS:  YES  TOURNIQUET:  * No tourniquets in log *  DICTATION: .Other Dictation: Dictation Number [id number]  PLAN OF CARE: Admit to inpatient   PATIENT DISPOSITION:  PACU - hemodynamically stable

## 2015-08-23 NOTE — ED Notes (Signed)
Dr. Chandra BatchFischer reported accepting physician would be Dr. Sunnie Nielsenegalado. Dr August Saucerean reported would do pt's surgery tonight. For pt to remain NPO. EDP aware.

## 2015-08-23 NOTE — Anesthesia Procedure Notes (Signed)
Procedure Name: LMA Insertion Date/Time: 08/23/2015 9:21 PM Performed by: Anastasio ChampionEVANS, Fionn Stracke E Pre-anesthesia Checklist: Patient identified, Emergency Drugs available, Suction available and Patient being monitored Patient Re-evaluated:Patient Re-evaluated prior to inductionOxygen Delivery Method: Circle System Utilized Preoxygenation: Pre-oxygenation with 100% oxygen Intubation Type: IV induction Ventilation: Mask ventilation without difficulty LMA: LMA inserted LMA Size: 4.0 Tube type: Oral Number of attempts: 1 Placement Confirmation: positive ETCO2 Tube secured with: Tape Dental Injury: Teeth and Oropharynx as per pre-operative assessment

## 2015-08-23 NOTE — ED Notes (Signed)
Waiting to hear back from Dr. Sherrie MustacheFisher. Pt family called x2 and updated.

## 2015-08-23 NOTE — ED Notes (Addendum)
Per Dr. Chandra BatchFischer, appropriate to remove aspen c-collar at this time.c-collar removed. Pt tolerated well.

## 2015-08-23 NOTE — ED Notes (Addendum)
Telephone consent given by pt daughter to transfer to [facility]. Pt family not at bedside to sign new transfer consent for [facility].

## 2015-08-23 NOTE — ED Notes (Addendum)
Contacted Bed Control for a bed update. Bed Control reported there at 14 surgeries scheduled today and that PACU would receive placement prior to ED patient. P[facility] admitting [facility] per EDP request.

## 2015-08-23 NOTE — Consult Note (Signed)
Reason for Consult: Left hip pain Referring Physician: Dr Loetta Rough Joseph [name] is an 81 y.o. male.  HPI: Joseph [name] is an 81 year old patient with left hip pain. He had a mechanical fall at home. Patient is a household and later with a cane. Does have Alzheimer's dementia and sleeps 10-15 hours a day. He does get up and walk around and is active going outside at times as well as active going to and from meals. He lives  with his family and his wife who lives with him at home.  Past Medical History  Diagnosis Date  . Alzheimer disease   . Hypothyroidism   . Hyperlipidemia   . Neuropathy of lower extremity   . Urinary incontinence due to cognitive impairment 06/08/2015  . Other symptoms involving cardiovascular system 04/23/2012  . CKD (chronic kidney disease)     Past Surgical History  Procedure Laterality Date  . Hernia repair      History reviewed. No pertinent family history.  Social History:  reports that he quit smoking about 43 years ago. His smoking use included Cigarettes and Pipe. He quit after 40 years of use. He quit smokeless tobacco use about 43 years ago. His smokeless tobacco use included Chew. He reports that he does not drink alcohol or use illicit drugs.  Allergies:  Allergies  Allergen Reactions  . Naproxen Other (See Comments)    unknown  . Sulfa Antibiotics Other (See Comments)    unknown    Medications: I have reviewed the patient's current medications.  Results for orders placed or performed during the hospital encounter of 08/23/15 (from the past 48 hour(s))  Urinalysis, Routine w reflex microscopic (not at [facility])     Status: Abnormal   Collection Time: 08/23/15  4:49 AM  Result Value Ref Range   Color, Urine YELLOW YELLOW   APPearance CLEAR CLEAR   Specific Gravity, Urine 1.010 1.005 - 1.030   pH 6.5 5.0 - 8.0   Glucose, UA NEGATIVE NEGATIVE mg/dL   Hgb urine dipstick LARGE (A) NEGATIVE   Bilirubin Urine NEGATIVE NEGATIVE   Ketones, ur NEGATIVE NEGATIVE  mg/dL   Protein, ur NEGATIVE NEGATIVE mg/dL   Urobilinogen, UA 0.2 0.0 - 1.0 mg/dL   Nitrite NEGATIVE NEGATIVE   Leukocytes, UA NEGATIVE NEGATIVE  Urine microscopic-add on     Status: None   Collection Time: 08/23/15  4:49 AM  Result Value Ref Range   RBC / HPF TOO NUMEROUS TO COUNT <3 RBC/hpf  TSH     Status: None   Collection Time: 08/23/15  4:49 AM  Result Value Ref Range   TSH 1.555 0.350 - 4.500 uIU/mL  Basic metabolic panel     Status: Abnormal   Collection Time: 08/23/15  6:00 AM  Result Value Ref Range   Sodium 137 135 - 145 mmol/L   Potassium 3.7 3.5 - 5.1 mmol/L   Chloride 102 101 - 111 mmol/L   CO2 26 22 - 32 mmol/L   Glucose, Bld 140 (H) 65 - 99 mg/dL   BUN 22 (H) 6 - 20 mg/dL   Creatinine, Ser 1.43 (H) 0.61 - 1.24 mg/dL   Calcium 9.3 8.9 - 10.3 mg/dL   GFR calc non Af Amer 42 (L) >60 mL/min   GFR calc Af Amer 49 (L) >60 mL/min    Comment: (NOTE) The eGFR has been calculated using the CKD EPI equation. This calculation has not been validated in all clinical situations. eGFR's persistently <60 mL/min signify possible Chronic  Kidney Disease.    Anion gap 9 5 - 15  CBC WITH DIFFERENTIAL     Status: Abnormal   Collection Time: 08/23/15  6:00 AM  Result Value Ref Range   WBC 15.2 (H) 4.0 - 10.5 K/uL   RBC 4.31 4.22 - 5.81 MIL/uL   Hemoglobin 14.0 13.0 - 17.0 g/dL   HCT 41.1 39.0 - 52.0 %   MCV 95.4 78.0 - 100.0 fL   MCH 32.5 26.0 - 34.0 pg   MCHC 34.1 30.0 - 36.0 g/dL   RDW 12.8 11.5 - 15.5 %   Platelets 185 150 - 400 K/uL   Neutrophils Relative % 77 %   Neutro Abs 11.7 (H) 1.7 - 7.7 K/uL   Lymphocytes Relative 13 %   Lymphs Abs 2.0 0.7 - 4.0 K/uL   Monocytes Relative 9 %   Monocytes Absolute 1.4 (H) 0.1 - 1.0 K/uL   Eosinophils Relative 1 %   Eosinophils Absolute 0.1 0.0 - 0.7 K/uL   Basophils Relative 0 %   Basophils Absolute 0.0 0.0 - 0.1 K/uL  Protime-INR     Status: None   Collection Time: 08/23/15  6:00 AM  Result Value Ref Range   Prothrombin  Time 14.5 11.6 - 15.2 seconds   INR 1.11 0.00 - 1.49  Type and screen [facility]     Status: None   Collection Time: 08/23/15  6:00 AM  Result Value Ref Range   ABO/RH(D) O NEG    Antibody Screen NEG    Sample Expiration 08/26/2015     Ct Head Wo Contrast  08/23/2015  CLINICAL DATA:  Trip and fall injury while going to the restroom this morning. Altered level of consciousness. Alzheimer disease. EXAM: CT HEAD WITHOUT CONTRAST CT CERVICAL SPINE WITHOUT CONTRAST TECHNIQUE: Multidetector CT imaging of the head and cervical spine was performed following the standard protocol without intravenous contrast. Multiplanar CT image reconstructions of the cervical spine were also generated. COMPARISON:  None. FINDINGS: CT HEAD FINDINGS Examination is technically limited due to motion artifact. Diffuse cerebral atrophy. Ventricular dilatation consistent with central atrophy. Patchy low-attenuation changes in the deep white matter consistent with small vessel ischemia. No mass effect or midline shift. No abnormal extra-axial fluid collections. Gray-white matter junctions are distinct. Basal cisterns are not effaced. No evidence of acute intracranial hemorrhage. No depressed skull fractures. Partial opacification or sclerosis in the left mastoid air cells. Right mastoid air cells are not opacified. Mucosal thickening demonstrated in the paranasal sinuses. Vascular calcifications. CT CERVICAL SPINE FINDINGS Diffuse degenerative change throughout the cervical spine with narrowed cervical interspaces and associated endplate hypertrophic changes. There is mild anterior subluxation of C3 on C4 and slight anterior subluxation of C7 on T1. This may be due to degenerative change but ligamentous injury could also have this appearance and is not excluded. Correlation physical examination is recommended. Degenerative changes throughout the cervical facet joints. No vertebral compression deformities. No prevertebral soft  tissue swelling. C1-2 articulation appears intact. No focal bone lesion or bone destruction. Consolidation or scarring demonstrated in the lung apices. IMPRESSION: No acute intracranial abnormalities. Chronic atrophy and small vessel ischemic changes. Diffuse degenerative change throughout the cervical spine. Mild anterior subluxation at C3-4 and C7-T1. These changes may be degenerative but ligamentous injury could also have this appearance and is not excluded. No acute displaced fractures identified. Electronically Signed   By: Lucienne Capers M.D.   On: 08/23/2015 05:46   Ct Cervical Spine Wo Contrast  08/23/2015  CLINICAL DATA:  Trip and fall injury while going to the restroom this morning. Altered level of consciousness. Alzheimer disease. EXAM: CT HEAD WITHOUT CONTRAST CT CERVICAL SPINE WITHOUT CONTRAST TECHNIQUE: Multidetector CT imaging of the head and cervical spine was performed following the standard protocol without intravenous contrast. Multiplanar CT image reconstructions of the cervical spine were also generated. COMPARISON:  None. FINDINGS: CT HEAD FINDINGS Examination is technically limited due to motion artifact. Diffuse cerebral atrophy. Ventricular dilatation consistent with central atrophy. Patchy low-attenuation changes in the deep white matter consistent with small vessel ischemia. No mass effect or midline shift. No abnormal extra-axial fluid collections. Gray-white matter junctions are distinct. Basal cisterns are not effaced. No evidence of acute intracranial hemorrhage. No depressed skull fractures. Partial opacification or sclerosis in the left mastoid air cells. Right mastoid air cells are not opacified. Mucosal thickening demonstrated in the paranasal sinuses. Vascular calcifications. CT CERVICAL SPINE FINDINGS Diffuse degenerative change throughout the cervical spine with narrowed cervical interspaces and associated endplate hypertrophic changes. There is mild anterior subluxation  of C3 on C4 and slight anterior subluxation of C7 on T1. This may be due to degenerative change but ligamentous injury could also have this appearance and is not excluded. Correlation physical examination is recommended. Degenerative changes throughout the cervical facet joints. No vertebral compression deformities. No prevertebral soft tissue swelling. C1-2 articulation appears intact. No focal bone lesion or bone destruction. Consolidation or scarring demonstrated in the lung apices. IMPRESSION: No acute intracranial abnormalities. Chronic atrophy and small vessel ischemic changes. Diffuse degenerative change throughout the cervical spine. Mild anterior subluxation at C3-4 and C7-T1. These changes may be degenerative but ligamentous injury could also have this appearance and is not excluded. No acute displaced fractures identified. Electronically Signed   By: Lucienne Capers M.D.   On: 08/23/2015 05:46   Mr Cervical Spine Wo Contrast  08/23/2015  CLINICAL DATA:  [place] on 08/23/2015 and injured neck. Persistent pain. EXAM: MRI CERVICAL SPINE WITHOUT CONTRAST TECHNIQUE: Multiplanar, multisequence MR imaging of the cervical spine was performed. No intravenous contrast was administered. COMPARISON:  CT scan, same date. FINDINGS: Advanced degenerative cervical spondylosis with multilevel disc disease and facet disease. Moderate C1-2 degenerative changes with pannus formation and mild narrowing of the ventral CSF space in the upper cervical cord region. The vertebral bodies demonstrate normal marrow signal. No findings to suggest an acute fracture. No abnormal increased STIR signal intensity in the posterior elements, paraspinal muscles or facets. The cervical spinal cord demonstrates normal signal intensity. No cord contusion or syrinx. C2-3:  No significant findings. C3-4: Degenerative anterior subluxation of C3 due to advanced facet disease. There is a bulging uncovered disc, osteophytic ridging and uncinate  spurring contributing to flattening of the ventral thecal sac and narrowing of the ventral CSF space. There is moderate foraminal stenosis bilaterally, left slightly worse than right. C4-5: Bulging annulus, osteophytic ridging, uncinate spurring and asymmetric right-sided facet disease. There is flattening of the ventral thecal sac and narrowing of the ventral CSF space. There is mild left foraminal stenosis and moderate to moderately severe right foraminal stenosis. C5-6: Right-sided disc osteophyte complex and right-sided facet disease contributing to moderate to moderately severe right foraminal stenosis. No spinal stenosis. Mild left foraminal encroachment. C6-7: Degenerative disc disease and right-sided facet disease. No significant spinal stenosis. There is mild bilateral foraminal stenosis. C7-T1:  No significant findings. IMPRESSION: 1. No MR findings to suggest an acute cervical spine fracture or cord contusion. No abnormal prevertebral soft tissue swelling.  2. Advanced degenerative cervical spondylosis with multilevel disc disease and facet disease as demonstrated on the CT scan. 3. Mild spinal and moderate bilateral foraminal stenosis at C3-4. 4. Mild spinal and mild left foraminal stenosis at C4-5. There is also moderately severe right foraminal stenosis. 5. Right-sided disc osteophyte complex at C5-6 with moderate to moderately severe right foraminal stenosis. 6. Mild bilateral foraminal stenosis at C6-7. Electronically Signed   By: Marijo Sanes M.D.   On: 08/23/2015 11:04   Dg Chest Portable 1 View  08/23/2015  CLINICAL DATA:  Fractured left hip.  Preoperative chest x-ray. EXAM: PORTABLE CHEST 1 VIEW COMPARISON:  09/30/2013 FINDINGS: Heart size and pulmonary vascularity are normal for technique. Slightly shallow inspiration. No focal airspace disease or consolidation in the lungs. No blunting of costophrenic angles. No pneumothorax. Tortuous aorta. Mediastinal contours appear intact. IMPRESSION:  No active disease. Electronically Signed   By: Lucienne Capers M.D.   On: 08/23/2015 06:49   Dg Knee Complete 4 Views Right  08/23/2015  CLINICAL DATA:  Patient fell on the left side in complains of right knee pain. EXAM: RIGHT KNEE - COMPLETE 4+ VIEW COMPARISON:  None. FINDINGS: Degenerative changes in the right knee with tricompartment narrowing and osteophytosis. Prominent chondrocalcinosis. No significant effusion. No evidence of acute fracture or subluxation. No focal bone lesion or bone destruction. Bone cortex and trabecular architecture appear intact. No radiopaque soft tissue foreign bodies. Vascular calcifications. IMPRESSION: Degenerative changes in the right knee. No acute bony abnormalities. Electronically Signed   By: Lucienne Capers M.D.   On: 08/23/2015 05:59   Dg Hip Unilat With Pelvis 2-3 Views Left  08/23/2015  CLINICAL DATA:  Patient fell on the left side.  Left hip pain. EXAM: DG HIP (WITH OR WITHOUT PELVIS) 2-3V LEFT COMPARISON:  None. FINDINGS: Acute comminuted posttraumatic inter trochanteric fractures of the left hip with mild varus angulation and impaction of the fracture fragments. Mild displacement of greater and lesser trochanteric fragments. Degenerative changes in the left hip. No evidence of left hip dislocation. No focal bone lesion or bone destruction. Vascular calcifications. Pelvis appears intact. SI joints and symphysis pubis are not displaced. IMPRESSION: Acute comminuted inter trochanteric fractures in the left hip. Electronically Signed   By: Lucienne Capers M.D.   On: 08/23/2015 05:58    Review of Systems  Unable to perform ROS  Blood pressure 135/78, pulse 116, temperature 98.8 F (37.1 C), temperature source Oral, resp. rate 16, height 6' (1.829 m), weight 68 kg (149 lb 14.6 oz), SpO2 100 %. Physical Exam  Constitutional: He appears well-developed.  HENT:  Head: Normocephalic.  Eyes: Pupils are equal, round, and reactive to light.  Cardiovascular:  Normal rate.   Respiratory: Effort normal.  Skin: Skin is warm.   patient doesn't really respond much to questions he does have no ankle effusion no knee effusion no crepitus or bruising around the knee or ankle slight shortening of the left lower extremity he has good ankle dorsi flexion plantar flexion strength upper extremities appear functional  Assessment/Plan: Impression is left hip intertrochanteric fracture plan intramedullary hip screw risk and benefits discussed with the patient we will and to infection or vessel damage nonunion malunion perioperative complications all questions answered plan for surgery tonight  Joseph [name] 08/23/2015, 5:52 PM

## 2015-08-23 NOTE — ED Notes (Signed)
[facility] at bedside 

## 2015-08-23 NOTE — H&P (Addendum)
[facility] History and Physical  Joseph [name] RUE:[id number] DOB: [birth year] DOA: 08/23/2015  Referring physician: ED physician, Dr. Wilkie AyeHorton PCP: Rudi HeapMOORE, DONALD, MD   Chief Complaint: Left hip pain and fracture  HPI: Joseph [name] is a 81 y.o. male with a history of Alzheimer's dementia, lower extremity neuropathy, and hypothyroidism, who presents to the emergency department with a complaint of left leg/hip pain. The patient is alert, but is chronically confused from his Alzheimer's dementia, therefore, history is being provided by the patient's children, [name] and [name]. Accordingly, the patient was in his usual state of health. Apparently, he got up to use the bathroom early this morning. His wife heard him fall and found him on the floor. There was no obvious loss of consciousness or head trauma, but he did complain of left hip pain. Prior to the fall, the patient had complaints of headaches, dizziness, nausea, vomiting, chest pain, palpitations, shortness of breath, nausea/vomiting/diarrhea, abdominal pain, or pain with urination. He does have chronic neuropathy in his legs and feet.  In the ED, he is afebrile and hemodynamically stable. His radiographic studies are significant for an x-ray that showed acute comminuted intertrochanteric fractures in the left hip and a cervical spine CT which showed diffuse degenerative changes and mild anterior subluxation at C3-4 and C7-T1; changes may be degenerative but ligamentous injury could also have this appearance and is not excluded; no acute displaced fractures identified. His lab data are significant for BUN of 22, creatinine of 1.43, WBC of 15.2, glucose of 140, and a urinalysis which reveals large hemoglobin and to numerous to count RBCs. He is being admitted for further evaluation and management.  Of note, the patient was given a couple milligrams of morphine in the ED which decreased his blood pressure. He was bolused and now his blood pressures  within normal limits.  Of note, the patient will be transferred to [facility] as orthopedic surgery is not currently available at [facility]. Dr. Crista Curborinna Sullivan will be the accepting physician. Orthopedic surgeon, Dr. August Saucerean is aware of the patient and the pending transfer.   Review of Systems:  As above in history present illness. In addition, he has neuropathic pain in his legs and feet; he has poor memory due to Alzheimer's dementia, he is hard of hearing; otherwise review systems is negative.   Past Medical History  Diagnosis Date  . Alzheimer disease   . Hypothyroidism   . Hyperlipidemia   . Neuropathy of lower extremity   . Urinary incontinence due to cognitive impairment 06/08/2015  . Other symptoms involving cardiovascular system 04/23/2012  . CKD (chronic kidney disease)    Past Surgical History  Procedure Laterality Date  . Hernia repair     Social History: He is married. He lives in [place] with his wife. They have 2 children, [name] and [name] who are his POA. The patient is a full code. He quit smoking about 43 years ago. His smoking use included Cigarettes and Pipe. He quit after 40 years of use. He quit smokeless tobacco use about 43 years ago. His smokeless tobacco use included Chew. He does not drink alcohol or use illicit drugs.  Allergies  Allergen Reactions  . Naproxen   . Sulfa Antibiotics     Family history: Both of his parents are deceased, etiologies of her death are unknown.  Prior to Admission medications   Medication Sig Start Date End Date Taking? Authorizing Provider  Cholecalciferol (VITAMIN D3) 2000 UNITS capsule Take  2,000 Units by mouth daily.   Yes Historical Provider, MD  donepezil (ARICEPT) 10 MG tablet TAKE 1 TABLET DAILY 08/08/15  Yes Ernestina Penna, MD  gabapentin (NEURONTIN) 300 MG capsule TAKE 1 CAPSULE IN THE MORNING AND 2 CAPSULES AT BEDTIME 06/19/15  Yes Ernestina Penna, MD  levothyroxine (SYNTHROID, LEVOTHROID) 75 MCG  tablet Take 1 tablet (75 mcg total) by mouth daily. 02/10/15  Yes Ernestina Penna, MD  NAMENDA XR 28 MG CP24 24 hr capsule TAKE (1) CAPSULE DAILY 08/15/15  Yes Ernestina Penna, MD   Physical Exam: Filed Vitals:   08/23/15 0430 08/23/15 0630 08/23/15 0700 08/23/15 0730  BP: 131/81 105/65 121/74 107/59  Pulse: 90 84 91 91  Temp:      TempSrc:      Resp: 16 15 14 16   Height:      Weight:      SpO2: 100% 90% 93% 95%    Wt Readings from Last 3 Encounters:  08/23/15 68.04 kg (150 lb)  06/08/15 63.504 kg (140 lb)  02/06/15 66.225 kg (146 lb)    General:  Appears calm and comfortable; elderly 81 year old man in no acute distress. Eyes: PERRL, normal lids, irises & conjunctiva; conjunctivae are clear and sclerae white. ENT: Hard of hearing; nasal mucosa is dry; oropharynx reveals a full set of dentures, mildly dry mucous membranes, no posterior exudates or erythema. Neck: Cervical collar on; no obvious adenopathy or thyromegaly. Cardiovascular: S1, S2, with a 2 to 3/6 systolic murmur. No LE edema. Telemetry: Not applicable.  Respiratory: CTA bilaterally, no w/r/r. Normal respiratory effort. Abdomen: soft, positive bowel sounds, soft, nontender, nondistended. Skin: no rash or induration seen on limited exam Musculoskeletal: Mild shortening of the left lower extremity; no tenderness to palpation of the left hip; no other acute changes; no pedal edema. Psychiatric: He is alert and calm; he is hard of hearing; flat affect. Neurologic: He is alert and hard of hearing. Cranial nerves II through XII are grossly intact otherwise. He is not oriented to time, place, or year.           Labs on Admission:  Basic Metabolic Panel:  Recent Labs Lab 08/23/15 0600  NA 137  K 3.7  CL 102  CO2 26  GLUCOSE 140*  BUN 22*  CREATININE 1.43*  CALCIUM 9.3   Liver Function Tests: No results for input(s): AST, ALT, ALKPHOS, BILITOT, PROT, ALBUMIN in the last 168 hours. No results for input(s): LIPASE,  AMYLASE in the last 168 hours. No results for input(s): AMMONIA in the last 168 hours. CBC:  Recent Labs Lab 08/23/15 0600  WBC 15.2*  NEUTROABS 11.7*  HGB 14.0  HCT 41.1  MCV 95.4  PLT 185   Cardiac Enzymes: No results for input(s): CKTOTAL, CKMB, CKMBINDEX, TROPONINI in the last 168 hours.  BNP (last 3 results) No results for input(s): BNP in the last 8760 hours.  ProBNP (last 3 results) No results for input(s): PROBNP in the last 8760 hours.  CBG: No results for input(s): GLUCAP in the last 168 hours.  Radiological Exams on Admission: Ct Head Wo Contrast  08/23/2015  CLINICAL DATA:  Trip and fall injury while going to the restroom this morning. Altered level of consciousness. Alzheimer disease. EXAM: CT HEAD WITHOUT CONTRAST CT CERVICAL SPINE WITHOUT CONTRAST TECHNIQUE: Multidetector CT imaging of the head and cervical spine was performed following the standard protocol without intravenous contrast. Multiplanar CT image reconstructions of the cervical spine were also generated. COMPARISON:  None. FINDINGS: CT HEAD FINDINGS Examination is technically limited due to motion artifact. Diffuse cerebral atrophy. Ventricular dilatation consistent with central atrophy. Patchy low-attenuation changes in the deep white matter consistent with small vessel ischemia. No mass effect or midline shift. No abnormal extra-axial fluid collections. Gray-white matter junctions are distinct. Basal cisterns are not effaced. No evidence of acute intracranial hemorrhage. No depressed skull fractures. Partial opacification or sclerosis in the left mastoid air cells. Right mastoid air cells are not opacified. Mucosal thickening demonstrated in the paranasal sinuses. Vascular calcifications. CT CERVICAL SPINE FINDINGS Diffuse degenerative change throughout the cervical spine with narrowed cervical interspaces and associated endplate hypertrophic changes. There is mild anterior subluxation of C3 on C4 and slight  anterior subluxation of C7 on T1. This may be due to degenerative change but ligamentous injury could also have this appearance and is not excluded. Correlation physical examination is recommended. Degenerative changes throughout the cervical facet joints. No vertebral compression deformities. No prevertebral soft tissue swelling. C1-2 articulation appears intact. No focal bone lesion or bone destruction. Consolidation or scarring demonstrated in the lung apices. IMPRESSION: No acute intracranial abnormalities. Chronic atrophy and small vessel ischemic changes. Diffuse degenerative change throughout the cervical spine. Mild anterior subluxation at C3-4 and C7-T1. These changes may be degenerative but ligamentous injury could also have this appearance and is not excluded. No acute displaced fractures identified. Electronically Signed   By: Burman Nieves M.D.   On: 08/23/2015 05:46   Ct Cervical Spine Wo Contrast  08/23/2015  CLINICAL DATA:  Trip and fall injury while going to the restroom this morning. Altered level of consciousness. Alzheimer disease. EXAM: CT HEAD WITHOUT CONTRAST CT CERVICAL SPINE WITHOUT CONTRAST TECHNIQUE: Multidetector CT imaging of the head and cervical spine was performed following the standard protocol without intravenous contrast. Multiplanar CT image reconstructions of the cervical spine were also generated. COMPARISON:  None. FINDINGS: CT HEAD FINDINGS Examination is technically limited due to motion artifact. Diffuse cerebral atrophy. Ventricular dilatation consistent with central atrophy. Patchy low-attenuation changes in the deep white matter consistent with small vessel ischemia. No mass effect or midline shift. No abnormal extra-axial fluid collections. Gray-white matter junctions are distinct. Basal cisterns are not effaced. No evidence of acute intracranial hemorrhage. No depressed skull fractures. Partial opacification or sclerosis in the left mastoid air cells. Right  mastoid air cells are not opacified. Mucosal thickening demonstrated in the paranasal sinuses. Vascular calcifications. CT CERVICAL SPINE FINDINGS Diffuse degenerative change throughout the cervical spine with narrowed cervical interspaces and associated endplate hypertrophic changes. There is mild anterior subluxation of C3 on C4 and slight anterior subluxation of C7 on T1. This may be due to degenerative change but ligamentous injury could also have this appearance and is not excluded. Correlation physical examination is recommended. Degenerative changes throughout the cervical facet joints. No vertebral compression deformities. No prevertebral soft tissue swelling. C1-2 articulation appears intact. No focal bone lesion or bone destruction. Consolidation or scarring demonstrated in the lung apices. IMPRESSION: No acute intracranial abnormalities. Chronic atrophy and small vessel ischemic changes. Diffuse degenerative change throughout the cervical spine. Mild anterior subluxation at C3-4 and C7-T1. These changes may be degenerative but ligamentous injury could also have this appearance and is not excluded. No acute displaced fractures identified. Electronically Signed   By: Burman Nieves M.D.   On: 08/23/2015 05:46   Dg Chest Portable 1 View  08/23/2015  CLINICAL DATA:  Fractured left hip.  Preoperative chest x-ray. EXAM: PORTABLE CHEST 1 VIEW COMPARISON:  09/30/2013 FINDINGS: Heart size and pulmonary vascularity are normal for technique. Slightly shallow inspiration. No focal airspace disease or consolidation in the lungs. No blunting of costophrenic angles. No pneumothorax. Tortuous aorta. Mediastinal contours appear intact. IMPRESSION: No active disease. Electronically Signed   By: Burman Nieves M.D.   On: 08/23/2015 06:49   Dg Knee Complete 4 Views Right  08/23/2015  CLINICAL DATA:  Patient fell on the left side in complains of right knee pain. EXAM: RIGHT KNEE - COMPLETE 4+ VIEW COMPARISON:   None. FINDINGS: Degenerative changes in the right knee with tricompartment narrowing and osteophytosis. Prominent chondrocalcinosis. No significant effusion. No evidence of acute fracture or subluxation. No focal bone lesion or bone destruction. Bone cortex and trabecular architecture appear intact. No radiopaque soft tissue foreign bodies. Vascular calcifications. IMPRESSION: Degenerative changes in the right knee. No acute bony abnormalities. Electronically Signed   By: Burman Nieves M.D.   On: 08/23/2015 05:59   Dg Hip Unilat With Pelvis 2-3 Views Left  08/23/2015  CLINICAL DATA:  Patient fell on the left side.  Left hip pain. EXAM: DG HIP (WITH OR WITHOUT PELVIS) 2-3V LEFT COMPARISON:  None. FINDINGS: Acute comminuted posttraumatic inter trochanteric fractures of the left hip with mild varus angulation and impaction of the fracture fragments. Mild displacement of greater and lesser trochanteric fragments. Degenerative changes in the left hip. No evidence of left hip dislocation. No focal bone lesion or bone destruction. Vascular calcifications. Pelvis appears intact. SI joints and symphysis pubis are not displaced. IMPRESSION: Acute comminuted inter trochanteric fractures in the left hip. Electronically Signed   By: Burman Nieves M.D.   On: 08/23/2015 05:58    EKG: Independently reviewed.   Assessment/Plan Principal Problem:   Intertrochanteric fracture of left hip (HCC) Active Problems:   Arthropathy of cervical spine   Chronic kidney disease, stage III (moderate)   Hematuria   Hypothyroid   Alzheimer's dementia without behavioral disturbance   Hip fracture (HCC)   Lower extremity neuropathy   Leukocytosis   1. Fall at home resulting in an acute intertrochanteric fracture of the left hip. Per history, there had been no recent history of any acute illness. There was no known loss of consciousness. Prodromal symptoms were unknown. He has had no history of MI, heart failure, or COPD.  He is cleared medically with exception of evaluation of possible ligamental injury of the cervical spine. Therefore, an MRI of his cervical spine has been ordered to determine if the changes seen on the CT of the cervical spine are degenerative or ligamentous in nature. Orthopedic surgeon, Dr. August Saucer was consulted by the ED physician, Dr. Wilkie Aye. He is aware that the patient will be transferred to [facility]. The patient will be kept nothing by mouth. DVT prophylaxis will given with SCDs. We'll decrease the dose of morphine for pain 0.5 mg every 4 hours and offer oral hydrocodone if needed. Will hydrate him with normal saline. 2. Arthropathy of the cervical spine; query ligamental injury. As above, MRI of the cervical spine will be ordered. 3. Hematuria. The patient's UA is positive for too numerous to count RBCs. The nurse reports that the urine was a clean-catch rather than an in and out specimen. Will order a urine culture. 4. Mild hyperglycemia. He has no history of diabetes. Will order hemoglobin A1c and follow. 5. Chronic neuropathy. Will continue gabapentin at a lower dose. 6. Hypothyroidism. Will continue Synthroid. Will order TSH. 7. Alzheimer's dementia. Currently stable. We'll continue [employer]  and Aricept.  8. Chronic kidney disease, stage II to stage 3. The patient's baseline creatinine per chart review is 1.3-1.5. It is currently at baseline. 9. Systolic heart murmur. This may be chronic. The patient has no evidence of congestive heart failure clinically or radiographically. He has no known history of valvular heart disease.    Code Status: Full code DVT Prophylaxis: SCDs Family Communication: Discussed with children Melanie and [name] Disposition Plan: Transfer to [facility]; ultimately patient will need SNF for rehabilitation following surgery.  Time spent: One hour.  [facility] Pager [phone]

## 2015-08-23 NOTE — ED Notes (Signed)
Pt daughter reported had to leave but reported would meet patient at [facility]. Pt daughter requesting to be updated. Pt daughter, [name], contact information [phone].

## 2015-08-23 NOTE — ED Notes (Signed)
Contacted Bed Control regarding pt placement. Bed Control at [facility] reported, no ortho beds available and are waiting for discharge. Hospitalist paged and aware.

## 2015-08-23 NOTE — ED Notes (Signed)
Attempted report. Charge RN of receiving unit reported in patient room at this time. To call back approx 20 minutes.

## 2015-08-24 ENCOUNTER — Encounter (HOSPITAL_COMMUNITY): Payer: Self-pay | Admitting: Orthopedic Surgery

## 2015-08-24 DIAGNOSIS — G309 Alzheimer's disease, unspecified: Secondary | ICD-10-CM

## 2015-08-24 DIAGNOSIS — N183 Chronic kidney disease, stage 3 (moderate): Secondary | ICD-10-CM

## 2015-08-24 DIAGNOSIS — D72829 Elevated white blood cell count, unspecified: Secondary | ICD-10-CM

## 2015-08-24 DIAGNOSIS — R319 Hematuria, unspecified: Secondary | ICD-10-CM

## 2015-08-24 LAB — COMPREHENSIVE METABOLIC PANEL
ALBUMIN: 3 g/dL — AB (ref 3.5–5.0)
ALK PHOS: 55 U/L (ref 38–126)
ALT: 13 U/L — AB (ref 17–63)
AST: 35 U/L (ref 15–41)
Anion gap: 5 (ref 5–15)
BILIRUBIN TOTAL: 0.7 mg/dL (ref 0.3–1.2)
BUN: 24 mg/dL — AB (ref 6–20)
CALCIUM: 8 mg/dL — AB (ref 8.9–10.3)
CO2: 26 mmol/L (ref 22–32)
Chloride: 108 mmol/L (ref 101–111)
Creatinine, Ser: 1.42 mg/dL — ABNORMAL HIGH (ref 0.61–1.24)
GFR calc Af Amer: 50 mL/min — ABNORMAL LOW (ref >60)
GFR calc non Af Amer: 43 mL/min — ABNORMAL LOW (ref >60)
GLUCOSE: 146 mg/dL — AB (ref 65–99)
Potassium: 4.3 mmol/L (ref 3.5–5.1)
SODIUM: 139 mmol/L (ref 135–145)
TOTAL PROTEIN: 5.7 g/dL — AB (ref 6.5–8.1)

## 2015-08-24 LAB — URINE CULTURE: Culture: NO GROWTH

## 2015-08-24 LAB — CBC
HEMATOCRIT: 30 % — AB (ref 39.0–52.0)
HEMOGLOBIN: 10 g/dL — AB (ref 13.0–17.0)
MCH: 32.5 pg (ref 26.0–34.0)
MCHC: 33.3 g/dL (ref 30.0–36.0)
MCV: 97.4 fL (ref 78.0–100.0)
Platelets: 170 10*3/uL (ref 150–400)
RBC: 3.08 MIL/uL — AB (ref 4.22–5.81)
RDW: 13.6 % (ref 11.5–15.5)
WBC: 13.5 10*3/uL — AB (ref 4.0–10.5)

## 2015-08-24 LAB — HEMOGLOBIN A1C
HEMOGLOBIN A1C: 5.7 % — AB (ref 4.8–5.6)
MEAN PLASMA GLUCOSE: 117 mg/dL

## 2015-08-24 LAB — PROTIME-INR
INR: 1.07 (ref 0.00–1.49)
Prothrombin Time: 14.1 seconds (ref 11.6–15.2)

## 2015-08-24 MED ORDER — SODIUM CHLORIDE 0.9 % IJ SOLN
INTRAMUSCULAR | Status: AC
  Filled 2015-08-24: qty 10

## 2015-08-24 MED ORDER — ENSURE ENLIVE PO LIQD
237.0000 mL | Freq: Three times a day (TID) | ORAL | Status: DC
  Administered 2015-08-24 – 2015-08-26 (×5): 237 mL via ORAL

## 2015-08-24 MED ORDER — SODIUM CHLORIDE 0.9 % IV SOLN
INTRAVENOUS | Status: DC
  Administered 2015-08-24 – 2015-08-26 (×4): via INTRAVENOUS

## 2015-08-24 MED ORDER — MORPHINE SULFATE (PF) 2 MG/ML IV SOLN: 0.5000 mg | INTRAVENOUS | Status: DC | PRN

## 2015-08-24 MED ORDER — HYDROCODONE-ACETAMINOPHEN 5-325 MG PO TABS: 1.0000 | ORAL_TABLET | Freq: Four times a day (QID) | ORAL | Status: DC | PRN

## 2015-08-24 MED ORDER — METOCLOPRAMIDE HCL 10 MG PO TABS: 5.0000 mg | ORAL_TABLET | Freq: Three times a day (TID) | ORAL | Status: DC | PRN

## 2015-08-24 MED ORDER — CEFAZOLIN SODIUM-DEXTROSE 2-3 GM-% IV SOLR: 2.0000 g | Freq: Three times a day (TID) | INTRAVENOUS | Status: DC

## 2015-08-24 MED ORDER — GABAPENTIN 100 MG PO CAPS
200.0000 mg | ORAL_CAPSULE | Freq: Every day | ORAL | Status: DC
  Administered 2015-08-24 – 2015-08-25 (×2): 200 mg via ORAL
  Filled 2015-08-24 (×3): qty 2

## 2015-08-24 MED ORDER — PHENOL 1.4 % MT LIQD
1.0000 | OROMUCOSAL | Status: DC | PRN
  Filled 2015-08-24: qty 177

## 2015-08-24 MED ORDER — HYDROCODONE-ACETAMINOPHEN 5-325 MG PO TABS
1.0000 | ORAL_TABLET | Freq: Four times a day (QID) | ORAL | Status: DC | PRN
  Administered 2015-08-24: 2 via ORAL
  Administered 2015-08-25 – 2015-08-26 (×2): 1 via ORAL
  Filled 2015-08-24: qty 1
  Filled 2015-08-24: qty 2
  Filled 2015-08-24: qty 1

## 2015-08-24 MED ORDER — ASPIRIN 325 MG PO TBEC: 325.0000 mg | DELAYED_RELEASE_TABLET | Freq: Every day | ORAL | Status: DC

## 2015-08-24 MED ORDER — ACETAMINOPHEN 325 MG PO TABS: 650.0000 mg | ORAL_TABLET | Freq: Four times a day (QID) | ORAL | Status: DC | PRN

## 2015-08-24 MED ORDER — POLYETHYLENE GLYCOL 3350 17 G PO PACK
17.0000 g | PACK | Freq: Every day | ORAL | Status: DC
  Administered 2015-08-26: 17 g via ORAL

## 2015-08-24 MED ORDER — MENTHOL 3 MG MT LOZG: 1.0000 | LOZENGE | OROMUCOSAL | Status: DC | PRN

## 2015-08-24 MED ORDER — ASPIRIN EC 325 MG PO TBEC
325.0000 mg | DELAYED_RELEASE_TABLET | Freq: Every day | ORAL | Status: DC
  Administered 2015-08-24 – 2015-08-26 (×3): 325 mg via ORAL
  Filled 2015-08-24 (×4): qty 1

## 2015-08-24 MED ORDER — POTASSIUM CHLORIDE IN NACL 20-0.9 MEQ/L-% IV SOLN
INTRAVENOUS | Status: DC
  Administered 2015-08-24: 02:00:00 via INTRAVENOUS
  Filled 2015-08-24 (×2): qty 1000

## 2015-08-24 MED ORDER — ONDANSETRON HCL 4 MG PO TABS: 4.0000 mg | ORAL_TABLET | Freq: Four times a day (QID) | ORAL | Status: DC | PRN

## 2015-08-24 MED ORDER — METOCLOPRAMIDE HCL 5 MG/ML IJ SOLN: 5.0000 mg | Freq: Three times a day (TID) | INTRAMUSCULAR | Status: DC | PRN

## 2015-08-24 MED ORDER — ACETAMINOPHEN 650 MG RE SUPP: 650.0000 mg | Freq: Four times a day (QID) | RECTAL | Status: DC | PRN

## 2015-08-24 MED ORDER — ONDANSETRON HCL 4 MG/2ML IJ SOLN: 4.0000 mg | Freq: Four times a day (QID) | INTRAMUSCULAR | Status: DC | PRN

## 2015-08-24 NOTE — Addendum Note (Signed)
Addendum  created 08/24/15 0636 by Wissam Resor E Arisa Congleton, CRNA   Modules edited: Anesthesia Events, Narrator   Narrator:  Narrator: Event Log Edited    

## 2015-08-24 NOTE — Care Management Note (Signed)
Case Management Note  Patient Details  Name: Joseph [name] MRN: [id number] Date of Birth: November 15, 1925  Subjective/Objective:                   INTRAMEDULLARY (IM) NAIL INTERTROCHANTRIC (Left) Action/Plan:  Discharge planning Expected Discharge Date:                  Expected Discharge Plan:  Skilled Nursing Facility  In-House Referral:     Discharge planning Services  CM Consult  Post Acute Care Choice:    Choice offered to:     DME Arranged:    DME Agency:     HH Arranged:    HH Agency:     Status of Service:  Completed, signed off  Medicare Important Message Given:    Date Medicare IM Given:    Medicare IM give by:    Date Additional Medicare IM Given:    Additional Medicare Important Message give by:     If discussed at Long Length of Stay Meetings, dates discussed:    Additional Comments: CM notes, from progression meeting, pt to go to SNF for rehab; CSW aware and arranging.  No other CM needs were communicated. Yves DillJeffries, Ceria Suminski Christine, RN 08/24/2015, 12:00 PM

## 2015-08-24 NOTE — Progress Notes (Signed)
OT Cancellation Note  Patient Details Name: Joseph [name] MRN: [id number] DOB: 06/14/1926   Cancelled Treatment:    Reason Eval/Treat Not Completed: Other (comment).  Plan is for SNF. Will defer OT evaluation to that venue.   Yeshua Stryker 08/24/2015, 12:18 PM  Marica OtterMaryellen Elveria Lauderbaugh, OTR/L [phone] 08/24/2015

## 2015-08-24 NOTE — Progress Notes (Signed)
X-ray results noted 

## 2015-08-24 NOTE — Clinical Social Work Placement (Signed)
   CLINICAL SOCIAL WORK PLACEMENT  NOTE  Date:  08/24/2015  Patient Details  Name: Joseph [name] MRN: [id number] Date of Birth: [birth year]  Clinical Social Work is seeking post-discharge placement for this patient at the Skilled  Nursing Facility level of care (*CSW will initial, date and re-position this form in  chart as items are completed):  Yes   Patient/family provided with [facility] Department's list of facilities offering this level of care within the geographic area requested by the patient (or if unable, by the patient's family).  Yes   Patient/family informed of their freedom to choose among providers that offer the needed level of care, that participate in Medicare, Medicaid or managed care program needed by the patient, have an available bed and are willing to accept the patient.  Yes   Patient/family informed of [facility] ownership interest in [facility] and [facility], as well as of the fact that they are under no obligation to receive care at these facilities.  PASRR submitted to EDS on 08/24/15     PASRR number received on 08/24/15     Existing PASRR number confirmed on       FL2 transmitted to all facilities in geographic area requested by pt/family on 08/24/15     FL2 transmitted to all facilities within larger geographic area on       Patient informed that his/her managed care company has contracts with or will negotiate with certain facilities, including the following:        Yes   Patient/family informed of bed offers received.  Patient chooses bed at [facility]     Physician recommends and patient chooses bed at      Patient to be transferred to   on  .  Patient to be transferred to facility by       Patient family notified on   of transfer.  Name of family member notified:        PHYSICIAN Please sign FL2     Additional Comment:    _______________________________________________ Orson EvaKIDD, Tamala Manzer A,  LCSW 08/24/2015, 4:55 PM

## 2015-08-24 NOTE — Anesthesia Postprocedure Evaluation (Signed)
  Anesthesia Post-op Note  Patient: Joseph [name]  Procedure(s) Performed: Procedure(s) (LRB): INTRAMEDULLARY (IM) NAIL INTERTROCHANTRIC (Left)  Patient Location: PACU  Anesthesia Type: General  Level of Consciousness: awake and alert   Airway and Oxygen Therapy: Patient Spontanous Breathing  Post-op Pain: mild  Post-op Assessment: Post-op Vital signs reviewed, Patient's Cardiovascular Status Stable, Respiratory Function Stable, Patent Airway and No signs of Nausea or vomiting  Last Vitals:  Filed Vitals:   08/24/15 0015  BP: 162/100  Pulse: 99  Temp:   Resp: 16    Post-op Vital Signs: stable   Complications: No apparent anesthesia complications

## 2015-08-24 NOTE — Progress Notes (Signed)
[facility] PROGRESS NOTE  Joseph [name] BEARETT PORCARO UJW:[id number] DOB: 14-Jan-1926 DOA: 08/23/2015 PCP: Rudi Heap, MD  Assessment/Plan: 1-Fall at home resulting in an acute intertrochanteric fracture of the left hip. S/P Left hip intertrochanteric Fracture open reduction and internal fixation.  PT per ortho.  DVT prophylaxis aspirin per ortho.   Arthropathy of the cervical spine: MRI No MR findings to suggest an acute cervical spine fracture or cord contusion.   Anemia; Blood loss post surgery. Repeat labs in am.   Hematuria. Urine culture no growth to date. He will need repeat UA outpatient and further evaluation as needed.   Mild hyperglycemia, Hb A1c at 5.7, pre diabetic. Diabetes educator consulted.   Chronic neuropath; continue gabapentin, lower dose to avoid sedation.   Hypothyroidism.  continue Synthroid.  TSH at 1.55.  Alzheimer's dementia. Currently stable. continue Namenda and Aricept.   Chronic kidney disease, stage II to stage 3. The patient's baseline creatinine per chart review is 1.3-1.5. It is currently at baseline.   Code Status: Full Code.  Family Communication: care discussed with son who was at bedside.  Disposition Plan: needs SNF   Consultants:  Dr August Saucer  Procedures: Left hip intertrochanteric fracture open reduction and internal fixation. 10-12  Antibiotics:  prophylaxis dose for surgery   HPI/Subjective: Per family patient has been awake, ate breakfast with assistance.  Patient wake up, answer questions.   Objective: Filed Vitals:   08/24/15 0947  BP: 129/59  Pulse: 99  Temp: 99.1 F (37.3 C)  Resp: 16    Intake/Output Summary (Last 24 hours) at 08/24/15 1216 Last data filed at 08/24/15 1046  Gross per 24 hour  Intake   3040 ml  Output    775 ml  Net   2265 ml   Filed Weights   08/23/15 0405 08/23/15 1430  Weight: 68.04 kg (150 lb) 68 kg (149 lb 14.6 oz)    Exam:   General:  Sleepy, but wake up answer some  question.  Cardiovascular: S 1, S 2 RRR  Respiratory: CTA  Abdomen: BS present, soft, nt  Musculoskeletal: no edema  Data Reviewed: Basic Metabolic Panel:  Recent Labs Lab 08/23/15 0600 08/24/15 0455  NA 137 139  K 3.7 4.3  CL 102 108  CO2 26 26  GLUCOSE 140* 146*  BUN 22* 24*  CREATININE 1.43* 1.42*  CALCIUM 9.3 8.0*   Liver Function Tests:  Recent Labs Lab 08/24/15 0455  AST 35  ALT 13*  ALKPHOS 55  BILITOT 0.7  PROT 5.7*  ALBUMIN 3.0*   No results for input(s): LIPASE, AMYLASE in the last 168 hours. No results for input(s): AMMONIA in the last 168 hours. CBC:  Recent Labs Lab 08/23/15 0600 08/24/15 0455  WBC 15.2* 13.5*  NEUTROABS 11.7*  --   HGB 14.0 10.0*  HCT 41.1 30.0*  MCV 95.4 97.4  PLT 185 170   Cardiac Enzymes: No results for input(s): CKTOTAL, CKMB, CKMBINDEX, TROPONINI in the last 168 hours. BNP (last 3 results) No results for input(s): BNP in the last 8760 hours.  ProBNP (last 3 results) No results for input(s): PROBNP in the last 8760 hours.  CBG: No results for input(s): GLUCAP in the last 168 hours.  Recent Results (from the past 240 hour(s))  Culture, Urine     Status: None   Collection Time: 08/23/15  4:49 AM  Result Value Ref Range Status   Specimen Description URINE, CLEAN CATCH  Final   Special Requests NONE  Final  Culture   Final    NO GROWTH 1 DAY Performed [facility]    Report Status 08/24/2015 FINAL  Final  Surgical pcr screen     Status: Abnormal   Collection Time: 08/23/15  3:42 PM  Result Value Ref Range Status   MRSA, PCR NEGATIVE NEGATIVE Final   Staphylococcus aureus POSITIVE (A) NEGATIVE Final    Comment:        The Xpert SA Assay (FDA approved for NASAL specimens in patients over 79 years of age), is one component of a comprehensive surveillance program.  Test performance has been validated by [facility] for patients greater than or equal to 79 year old. It is not intended to  diagnose infection nor to guide or monitor treatment.      Studies: Ct Head Wo Contrast  08/23/2015  CLINICAL DATA:  Trip and fall injury while going to the restroom this morning. Altered level of consciousness. Alzheimer disease. EXAM: CT HEAD WITHOUT CONTRAST CT CERVICAL SPINE WITHOUT CONTRAST TECHNIQUE: Multidetector CT imaging of the head and cervical spine was performed following the standard protocol without intravenous contrast. Multiplanar CT image reconstructions of the cervical spine were also generated. COMPARISON:  None. FINDINGS: CT HEAD FINDINGS Examination is technically limited due to motion artifact. Diffuse cerebral atrophy. Ventricular dilatation consistent with central atrophy. Patchy low-attenuation changes in the deep white matter consistent with small vessel ischemia. No mass effect or midline shift. No abnormal extra-axial fluid collections. Gray-white matter junctions are distinct. Basal cisterns are not effaced. No evidence of acute intracranial hemorrhage. No depressed skull fractures. Partial opacification or sclerosis in the left mastoid air cells. Right mastoid air cells are not opacified. Mucosal thickening demonstrated in the paranasal sinuses. Vascular calcifications. CT CERVICAL SPINE FINDINGS Diffuse degenerative change throughout the cervical spine with narrowed cervical interspaces and associated endplate hypertrophic changes. There is mild anterior subluxation of C3 on C4 and slight anterior subluxation of C7 on T1. This may be due to degenerative change but ligamentous injury could also have this appearance and is not excluded. Correlation physical examination is recommended. Degenerative changes throughout the cervical facet joints. No vertebral compression deformities. No prevertebral soft tissue swelling. C1-2 articulation appears intact. No focal bone lesion or bone destruction. Consolidation or scarring demonstrated in the lung apices. IMPRESSION: No acute  intracranial abnormalities. Chronic atrophy and small vessel ischemic changes. Diffuse degenerative change throughout the cervical spine. Mild anterior subluxation at C3-4 and C7-T1. These changes may be degenerative but ligamentous injury could also have this appearance and is not excluded. No acute displaced fractures identified. Electronically Signed   By: Burman NievesWilliam  Stevens M.D.   On: 08/23/2015 05:46   Ct Cervical Spine Wo Contrast  08/23/2015  CLINICAL DATA:  Trip and fall injury while going to the restroom this morning. Altered level of consciousness. Alzheimer disease. EXAM: CT HEAD WITHOUT CONTRAST CT CERVICAL SPINE WITHOUT CONTRAST TECHNIQUE: Multidetector CT imaging of the head and cervical spine was performed following the standard protocol without intravenous contrast. Multiplanar CT image reconstructions of the cervical spine were also generated. COMPARISON:  None. FINDINGS: CT HEAD FINDINGS Examination is technically limited due to motion artifact. Diffuse cerebral atrophy. Ventricular dilatation consistent with central atrophy. Patchy low-attenuation changes in the deep white matter consistent with small vessel ischemia. No mass effect or midline shift. No abnormal extra-axial fluid collections. Gray-white matter junctions are distinct. Basal cisterns are not effaced. No evidence of acute intracranial hemorrhage. No depressed skull fractures. Partial opacification or sclerosis  in the left mastoid air cells. Right mastoid air cells are not opacified. Mucosal thickening demonstrated in the paranasal sinuses. Vascular calcifications. CT CERVICAL SPINE FINDINGS Diffuse degenerative change throughout the cervical spine with narrowed cervical interspaces and associated endplate hypertrophic changes. There is mild anterior subluxation of C3 on C4 and slight anterior subluxation of C7 on T1. This may be due to degenerative change but ligamentous injury could also have this appearance and is not excluded.  Correlation physical examination is recommended. Degenerative changes throughout the cervical facet joints. No vertebral compression deformities. No prevertebral soft tissue swelling. C1-2 articulation appears intact. No focal bone lesion or bone destruction. Consolidation or scarring demonstrated in the lung apices. IMPRESSION: No acute intracranial abnormalities. Chronic atrophy and small vessel ischemic changes. Diffuse degenerative change throughout the cervical spine. Mild anterior subluxation at C3-4 and C7-T1. These changes may be degenerative but ligamentous injury could also have this appearance and is not excluded. No acute displaced fractures identified. Electronically Signed   By: Burman Nieves M.D.   On: 08/23/2015 05:46   Mr Cervical Spine Wo Contrast  08/23/2015  CLINICAL DATA:  Larey Seat on 08/23/2015 and injured neck. Persistent pain. EXAM: MRI CERVICAL SPINE WITHOUT CONTRAST TECHNIQUE: Multiplanar, multisequence MR imaging of the cervical spine was performed. No intravenous contrast was administered. COMPARISON:  CT scan, same date. FINDINGS: Advanced degenerative cervical spondylosis with multilevel disc disease and facet disease. Moderate C1-2 degenerative changes with pannus formation and mild narrowing of the ventral CSF space in the upper cervical cord region. The vertebral bodies demonstrate normal marrow signal. No findings to suggest an acute fracture. No abnormal increased STIR signal intensity in the posterior elements, paraspinal muscles or facets. The cervical spinal cord demonstrates normal signal intensity. No cord contusion or syrinx. C2-3:  No significant findings. C3-4: Degenerative anterior subluxation of C3 due to advanced facet disease. There is a bulging uncovered disc, osteophytic ridging and uncinate spurring contributing to flattening of the ventral thecal sac and narrowing of the ventral CSF space. There is moderate foraminal stenosis bilaterally, left slightly worse than  right. C4-5: Bulging annulus, osteophytic ridging, uncinate spurring and asymmetric right-sided facet disease. There is flattening of the ventral thecal sac and narrowing of the ventral CSF space. There is mild left foraminal stenosis and moderate to moderately severe right foraminal stenosis. C5-6: Right-sided disc osteophyte complex and right-sided facet disease contributing to moderate to moderately severe right foraminal stenosis. No spinal stenosis. Mild left foraminal encroachment. C6-7: Degenerative disc disease and right-sided facet disease. No significant spinal stenosis. There is mild bilateral foraminal stenosis. C7-T1:  No significant findings. IMPRESSION: 1. No MR findings to suggest an acute cervical spine fracture or cord contusion. No abnormal prevertebral soft tissue swelling. 2. Advanced degenerative cervical spondylosis with multilevel disc disease and facet disease as demonstrated on the CT scan. 3. Mild spinal and moderate bilateral foraminal stenosis at C3-4. 4. Mild spinal and mild left foraminal stenosis at C4-5. There is also moderately severe right foraminal stenosis. 5. Right-sided disc osteophyte complex at C5-6 with moderate to moderately severe right foraminal stenosis. 6. Mild bilateral foraminal stenosis at C6-7. Electronically Signed   By: Rudie Meyer M.D.   On: 08/23/2015 11:04   Pelvis Portable  08/24/2015  CLINICAL DATA:  81 year old male status post left hip ORIF. Initial encounter. EXAM: PORTABLE PELVIS 1-2 VIEWS COMPARISON:  Intraoperative images 20 302 hr today. FINDINGS: Portable AP view at 2348 hrs. Partially visible left femur ORIF hardware appears stable. Stable alignment about  the intertrochanteric fracture. Lesser trochanter fragment mildly retracted. No other acute osseous abnormality identified. Foley catheter in place. IMPRESSION: Status post Left femur ORIF. Lesser trochanter butterfly fragment mildly retracted. Electronically Signed   By: Odessa Fleming M.D.   On:  08/24/2015 00:16   Dg Chest Portable 1 View  08/23/2015  CLINICAL DATA:  Fractured left hip.  Preoperative chest x-ray. EXAM: PORTABLE CHEST 1 VIEW COMPARISON:  09/30/2013 FINDINGS: Heart size and pulmonary vascularity are normal for technique. Slightly shallow inspiration. No focal airspace disease or consolidation in the lungs. No blunting of costophrenic angles. No pneumothorax. Tortuous aorta. Mediastinal contours appear intact. IMPRESSION: No active disease. Electronically Signed   By: Burman Nieves M.D.   On: 08/23/2015 06:49   Dg Knee Complete 4 Views Right  08/23/2015  CLINICAL DATA:  Patient fell on the left side in complains of right knee pain. EXAM: RIGHT KNEE - COMPLETE 4+ VIEW COMPARISON:  None. FINDINGS: Degenerative changes in the right knee with tricompartment narrowing and osteophytosis. Prominent chondrocalcinosis. No significant effusion. No evidence of acute fracture or subluxation. No focal bone lesion or bone destruction. Bone cortex and trabecular architecture appear intact. No radiopaque soft tissue foreign bodies. Vascular calcifications. IMPRESSION: Degenerative changes in the right knee. No acute bony abnormalities. Electronically Signed   By: Burman Nieves M.D.   On: 08/23/2015 05:59   Dg C-arm 1-60 Min-no Report  08/23/2015  CLINICAL DATA: surgery C-ARM 1-60 MINUTES Fluoroscopy was utilized by the requesting physician.  No radiographic interpretation.   Dg Hip Unilat With Pelvis 2-3 Views Left  08/23/2015  CLINICAL DATA:  Patient fell on the left side.  Left hip pain. EXAM: DG HIP (WITH OR WITHOUT PELVIS) 2-3V LEFT COMPARISON:  None. FINDINGS: Acute comminuted posttraumatic inter trochanteric fractures of the left hip with mild varus angulation and impaction of the fracture fragments. Mild displacement of greater and lesser trochanteric fragments. Degenerative changes in the left hip. No evidence of left hip dislocation. No focal bone lesion or bone destruction.  Vascular calcifications. Pelvis appears intact. SI joints and symphysis pubis are not displaced. IMPRESSION: Acute comminuted inter trochanteric fractures in the left hip. Electronically Signed   By: Burman Nieves M.D.   On: 08/23/2015 05:58   Dg Femur Min 2 Views Left  08/23/2015  CLINICAL DATA:  81 year old male undergoing left femur ORIF. Initial encounter. EXAM: DG C-ARM 1-60 MIN-NO REPORT; LEFT FEMUR 2 VIEWS COMPARISON:  0522 hr today. FINDINGS: 3 intraoperative fluoroscopic views of the left femur demonstrate intra medullary rod placement with proximal inner interlocking dynamic hip screw. Improved alignment about the comminuted intertrochanteric fracture. IMPRESSION: ORIF left femur with no adverse features. FLUOROSCOPY TIME:  2 min 14 seconds Electronically Signed   By: Odessa Fleming M.D.   On: 08/23/2015 23:33    Scheduled Meds: . aspirin EC  325 mg Oral Q breakfast  .  ceFAZolin (ANCEF) IV  2 g Intravenous 3 times per day  . cholecalciferol  2,000 Units Oral Daily  . donepezil  10 mg Oral Daily  . gabapentin  400 mg Oral QHS  . levothyroxine  75 mcg Oral Daily  . memantine  28 mg Oral Daily  . senna  1 tablet Oral QHS  . sodium chloride       Continuous Infusions: . 0.9 % NaCl with KCl 20 mEq / L 75 mL/hr at 08/24/15 0206    Principal Problem:   Intertrochanteric fracture of left hip [facility]) Active Problems:   Hypothyroid  Alzheimer's dementia without behavioral disturbance   Hip fracture (HCC)   Chronic kidney disease, stage III (moderate)   Lower extremity neuropathy   Leukocytosis   Arthropathy of cervical spine   Hematuria    Time spent: 25 minutes.     Hartley Barefoot A  [facility] Pager [phone]. If 7PM-7AM, please contact night-coverage at [email or web address], password [facility] 08/24/2015, 12:16 PM  LOS: 1 day

## 2015-08-24 NOTE — Progress Notes (Signed)
Utilization review completed.  

## 2015-08-24 NOTE — Progress Notes (Signed)
Portable AP Pelvis X-RAY done. 

## 2015-08-24 NOTE — Progress Notes (Addendum)
CSW continuing to follow.   CSW followed up with pt, pt wife, pt son and pt daughter at bedside.   CSW provided SNF bed offers to pt family.   Pt family plans to review options and notify this CSW of decision for SNF.   Pt sleeping comfortably at this time and pt family reports that pt surgery went well, but pt surgery was not completed until almost midnight last night.  CSW to continue to follow to provide support and assist with pt disposition needs.   Addendum 4:40 pm:  CSW received phone call from pt son, Joseph [name] stating that pt family made decision regarding SNF placement and pt family [facility].  CSW contacted [facility] and left message with admissions coordinator to notify facility of pt family acceptance of bed offer. CSW to await return phone call.   CSW to continue to follow to provide support and assist with pt disposition needs when medically stable for discharge.   Loletta SpecterSuzanna [name], MSW, LCSW Clinical Social Work [phone]

## 2015-08-24 NOTE — Evaluation (Signed)
Physical Therapy Evaluation Patient Details Name: Joseph [name] MRN: [id number] DOB: [birth year] Today's Date: 08/24/2015   History of Present Illness  Pt s/p fall with L hip fx and IM nail repair.  Pt with hx of LE neuropathy, CKD and dementia  Clinical Impression  Pt s/p L hip fx presents with functional mobility limitations 2* decreased L LE strength/ROM, post op pain, NWB L LE and significant dementia.  Pt would benefit from follow up rehab at SNF level.    Follow Up Recommendations SNF    Equipment Recommendations  None recommended by PT    Recommendations for Other Services       Precautions / Restrictions Precautions Precautions: Fall Restrictions Weight Bearing Restrictions: Yes LLE Weight Bearing: Non weight bearing      Mobility  Bed Mobility Overal bed mobility: Needs Assistance;+2 for physical assistance;+ 2 for safety/equipment Bed Mobility: Supine to Sit;Sit to Supine     Supine to sit: Joseph [name];+2 for physical assistance;+2 for safety/equipment Sit to supine: Joseph [name];+2 for physical assistance;+2 for safety/equipment   General bed mobility comments: [name] with bil LEs and to control trunk.  Pt assisted to EOB with pad  Transfers Overall transfer level: Needs assistance Equipment used: Rolling walker (2 wheeled) Transfers: Sit to/from Stand Sit to Stand: Mod [name];Joseph [name];+2 physical assistance;+2 safety/equipment;From elevated surface         General transfer comment: Cues for LE management and physical [name] to maintain NWB with L LE extended fwd  Ambulation/Gait             General Gait Details: Pt stood with [name] of 2 and RW x ~801min with physical [name] to maintain L LE fwd adn NWB.  Ambulation NT - pt unable to follow cues to maintain NWB on L LE   Stairs            Wheelchair Mobility    Modified Rankin (Stroke Patients Only)       Balance Overall balance assessment: Needs assistance Sitting-balance support: Feet  supported;Bilateral upper extremity supported Sitting balance-Joseph [name]: Fair     Standing balance support: Bilateral upper extremity supported Standing balance-Joseph [name]: Poor                               Pertinent Vitals/Pain Pain Assessment: Faces Faces Pain [name]: Hurts a little bit Pain Location: L LE Pain Intervention(s): Limited activity within patient's tolerance;Monitored during session;Ice applied    Home Living Family/patient expects to be discharged to:: Skilled nursing facility Living Arrangements: Spouse/significant other                    Prior Function           Comments: Pt unable to provide information - family not present     Hand Dominance        Extremity/Trunk Assessment   Upper Extremity Assessment: Overall WFL for tasks assessed           Lower Extremity Assessment: LLE deficits/detail         Communication   Communication: HOH;Other (comment) (dementia)  Cognition Arousal/Alertness: Awake/alert Behavior During Therapy: Flat affect Overall Cognitive Status: History of cognitive impairments - at baseline Area of Impairment: Following commands;Memory;Orientation;Safety/judgement Orientation Level: Place;Time;Situation   Memory: Decreased short-term memory Following Commands: Follows one step commands inconsistently;Follows one step commands with increased time Safety/Judgement: Decreased awareness of safety;Decreased awareness of deficits  General Comments      Exercises        Assessment/Plan    PT Assessment Patient needs continued PT services  PT Diagnosis Difficulty walking;Altered mental status   PT Problem List Decreased strength;Decreased range of motion;Decreased activity tolerance;Decreased balance;Decreased mobility;Decreased cognition;Decreased knowledge of use of DME;Pain;Decreased safety awareness;Decreased knowledge of precautions  PT Treatment Interventions DME  instruction;Gait training;Functional mobility training;Therapeutic activities;Therapeutic exercise;Cognitive remediation;Patient/family education   PT Goals (Current goals can be found in the Care Plan section) Acute Rehab PT Goals Patient Stated Goal: No goals stated. PT Goal Formulation: Patient unable to participate in goal setting Time For Goal Achievement: 08/31/15 Potential to Achieve Goals: Fair    Frequency Min 3X/week   Barriers to discharge        Co-evaluation               End of Session Equipment Utilized During Treatment: Gait belt Activity Tolerance: Patient tolerated treatment well Patient left: in bed;with call bell/phone within reach Nurse Communication: Mobility status         Time: 1610-9604 PT Time Calculation (min) (ACUTE ONLY): 19 min   Charges:   PT Evaluation $Initial PT Evaluation Tier I: 1 Procedure     PT G Codes:        Joseph [name] 09/07/2015, 4:21 PM

## 2015-08-24 NOTE — Op Note (Signed)
NAMClaudell Kyle:  Lafoe, Ruffus                    ACCOUNT NO.:  [id number]  MEDICAL RECORD NO.:  [id number]  LOCATION:  1611                         [facility]  PHYSICIAN:  Burnard BuntingG. Scott Dean, M.D.    DATE OF BIRTH:  [birth year]  DATE OF PROCEDURE:  08/23/2015 DATE OF DISCHARGE:                              OPERATIVE REPORT   PREOPERATIVE DIAGNOSIS:  Left hip intertrochanteric fracture.  POSTOPERATIVE DIAGNOSIS:  Left hip intertrochanteric fracture.  PROCEDURE:  Left hip intertrochanteric fracture open reduction and internal fixation.  SURGEON:  Burnard BuntingG. Scott Dean, M.D.  ASSISTANT:  None.  ANESTHESIA:  General.  INDICATIONS:  Joseph [name] is an 81 year old patient with complex left hip intertrochanteric fracture presents for operative management after explanation of risks and benefits to the family.  PROCEDURE IN DETAILS:  The patient was brought to the operating room where general anesthetic was induced.  Preop antibiotics were administered.  Time-out was called.  Left leg placed under traction on the Hana bed, the right leg placed in lithotomy position with peroneal nerve well padded.  Fracture reduction was attempted.  The nail length was measured.  The fracture itself was complex multipart fracture with flexion of the proximal head and neck.  The area was pre-scrubbed with alcohol and Betadine and allowed the air to dry, prepped with DuraPrep solution and draped in a sterile manner with a wall drape.  Incision was made in line with the femoral neck.  The fascia lata was divided.  Cobb elevator was then placed bluntly over the distal aspect of the medial intertrochanteric portion of the fracture.  Reduction was then assisted with traction, internal rotation, and manual manipulation with 1 Cobb elevator of the fracture fragment.  Reasonable reduction was achieved. Guide pin was then placed to the more proximal incision into the tip of the trochanter into the shaft.  Proximal reaming was  performed and the nail was placed.  Again, with the Cobb in position, the lag screw and compression screw were placed through a separate incision with the jig. The patient had significant anteversion.  The lag and compression screw were placed on the posterior aspect of the femoral neck into the center portion of the head on the lateral and in the center-center portion of the head in the AP.  Again, fracture reduction was required with a Cobb elevator on the anterior portion of the femoral neck.  Following this, again blunt dissection was performed to place that only on the femoral neck and then on the other soft tissues.  At this time, the screws were placed.  Corrected hardware placed and was confirmed in the AP and lateral planes.  Good compression was achieved.  All incisions were then thoroughly irrigated and closed using 0 Vicryl suture, 2-0 Vicryl suture, and skin staples.  Mepilex dressing was applied.  The patient tolerated the procedure well without immediate complications and transferred to recovery room in stable condition.     Burnard BuntingG. Scott Dean, M.D.     GSD/MEDQ  D:  08/23/2015  T:  08/24/2015  Job:  [id number]

## 2015-08-24 NOTE — Clinical Social Work Placement (Signed)
   CLINICAL SOCIAL WORK PLACEMENT  NOTE  Date:  08/24/2015  Patient Details  Name: Joseph [name] MRN: [id number] Date of Birth: 1926-04-06  Clinical Social Work is seeking post-discharge placement for this patient at the Skilled  Nursing Facility level of care (*CSW will initial, date and re-position this form in  chart as items are completed):  Yes   Patient/family provided with [facility] Clinical Social Work Department's list of facilities offering this level of care within the geographic area requested by the patient (or if unable, by the patient's family).  Yes   Patient/family informed of their freedom to choose among providers that offer the needed level of care, that participate in Medicare, Medicaid or managed care program needed by the patient, have an available bed and are willing to accept the patient.  Yes   Patient/family informed of [facility]'s ownership interest in [facility] and [facility], as well as of the fact that they are under no obligation to receive care at these facilities.  PASRR submitted to EDS on 08/24/15     PASRR number received on 08/24/15     Existing PASRR number confirmed on       FL2 transmitted to all facilities in geographic area requested by pt/family on 08/24/15     FL2 transmitted to all facilities within larger geographic area on       Patient informed that his/her managed care company has contracts with or will negotiate with certain facilities, including the following:            Patient/family informed of bed offers received.  Patient chooses bed at       Physician recommends and patient chooses bed at      Patient to be transferred to   on  .  Patient to be transferred to facility by       Patient family notified on   of transfer.  Name of family member notified:        PHYSICIAN Please sign FL2     Additional Comment:    _______________________________________________ Orson EvaKIDD, Talbert Trembath A, LCSW 08/24/2015, 12:49  PM

## 2015-08-24 NOTE — Progress Notes (Signed)
Inpatient Diabetes Program Recommendations  AACE/ADA: New Consensus Statement on Inpatient Glycemic Control (2015)  Target Ranges:  Prepandial:   less than 140 mg/dL      Peak postprandial:   less than 180 mg/dL (1-2 hours)      Critically ill patients:  140 - 180 mg/dL   Review of Glycemic Control  Results for Joseph [name], Joseph [name] (MRN [id number]) as of 08/24/2015 16:04  Ref. Range 08/23/2015 06:30  Hemoglobin A1C Latest Ref Range: 4.8-5.6 % 5.7 (H)  Results for Joseph [name], Joseph [name] (MRN [id number]) as of 08/24/2015 16:04  Ref. Range 08/23/2015 06:00 08/24/2015 04:55  Glucose Latest Ref Range: 65-99 mg/dL 782140 (H) 956146 (H)   Received Diabetes Consult for mildly elevated Hgb631C in 81 year old male admitted with a mechanical fall from home. Per EMR, hx Alzheimer's dementia and sleeps 10-15 hours a day. He does get up and walk around and is active going outside at times as well as active going to and from meals. He lives with his family and his wife who lives with him at home. Spoke with RN who stated pt has Alzheimer's and would not benefit from discussion of pre-diabetes and HgbA1C results. Will be discharged to SNF. Recommend no diabetes meds or dietary restrictions. Will continue to follow while inpatient.  Thank you. Ailene Ardshonda Tersa Fotopoulos, RD, LDN, CDE Inpatient Diabetes Coordinator [phone]

## 2015-08-24 NOTE — Clinical Social Work Note (Signed)
Clinical Social Work Assessment-Late Entry  Patient Details  Name: Joseph [name] MRN: [id number] Date of Birth: 03-13-1926  Date of referral:  08/23/15               Reason for consult:  Discharge Planning                Permission sought to share information with:  Family Supports Permission granted to share information::  Yes, Verbal Permission Granted  Name::     Baker [place]onta  Agency::     Relationship::  son  Contact Information:  [phone]  Housing/Transportation Living arrangements for the past 2 months:  Single Family Home Source of Information:  Adult Children, Spouse Patient Interpreter Needed:  None Criminal Activity/Legal Involvement Pertinent to Current Situation/Hospitalization:  No - Comment as needed Significant Relationships:  Adult Children, Spouse Lives with:  Spouse Do you feel safe going back to the place where you live?  No Need for family participation in patient care:  Yes (Comment)  Care giving concerns:  Pt admitted from home with wife. Pt s/p fall with hip fracture.    Social Worker assessment / plan:  CSW received referral that pt admitted with hip fracture; anticipate need for SNF.  CSW met with pt, pt wife, pt son and his significant other at bedside. CSW introduced self and explained role. Pt son discussed that pt admitted from home with pt wife. Pt son stated that pt having surgery this evening. CSW discussed that pt will likely require SNF following hospitalization due to hip fracture. Pt family expressed understanding and pt son discussed that he agrees that pt will need short term rehab following hospitalization. CSW discussed process of SNF placement and pt family agreeable to [facility] search.  CSW completed FL2 and initiated SNF search to [facility].  CSW to follow up with pt and pt family regarding SNF bed offers.  CSW to continue to follow to provide support and assist with pt disposition needs.   Employment status:   Retired Forensic scientist:  Medicare PT Recommendations:  Not assessed at this time [place] / Referral to community resources:  [place]  Patient/Family's Response to care:  Pt alert and oriented to person only. Pt has history of Alzheimer's Disease. Pt family actively involved in pt care. Pt family agreeable to SNF upon discharge.  Patient/Family's Understanding of and Emotional Response to Diagnosis, Current Treatment, and Prognosis:  Pt son expressed knowledge surrounding pt diagnosis and plan for surgery.  Emotional Assessment Appearance:  Appears stated age Attitude/Demeanor/Rapport:  Other (pt oriented to person only-pt son mainly communicated with CSW during assessment) Affect (typically observed):  Quiet Orientation:  Oriented to Self Alcohol / Substance use:  Not Applicable Psych involvement (Current and /or in the community):  No (Comment)  Discharge Needs  Concerns to be addressed:  Discharge Planning Concerns Readmission within the last 30 days:  No Current discharge risk:  Physical Impairment Barriers to Discharge:  Continued Medical Work up   Alison Murray A, [place] 08/24/2015, 12:40 PM  [phone]

## 2015-08-24 NOTE — Progress Notes (Signed)
Pt stable Mild pain with leg rom Foot perfused and sensate Ready for dc to snf from [facility]

## 2015-08-25 DIAGNOSIS — M129 Arthropathy, unspecified: Secondary | ICD-10-CM

## 2015-08-25 DIAGNOSIS — S72002A Fracture of unspecified part of neck of left femur, initial encounter for closed fracture: Secondary | ICD-10-CM

## 2015-08-25 LAB — CBC
HEMATOCRIT: 24.5 % — AB (ref 39.0–52.0)
HEMOGLOBIN: 8.1 g/dL — AB (ref 13.0–17.0)
MCH: 32 pg (ref 26.0–34.0)
MCHC: 33.1 g/dL (ref 30.0–36.0)
MCV: 96.8 fL (ref 78.0–100.0)
Platelets: 129 10*3/uL — ABNORMAL LOW (ref 150–400)
RBC: 2.53 MIL/uL — AB (ref 4.22–5.81)
RDW: 13.5 % (ref 11.5–15.5)
WBC: 12 10*3/uL — AB (ref 4.0–10.5)

## 2015-08-25 LAB — BASIC METABOLIC PANEL
ANION GAP: 3 — AB (ref 5–15)
BUN: 23 mg/dL — ABNORMAL HIGH (ref 6–20)
CHLORIDE: 108 mmol/L (ref 101–111)
CO2: 27 mmol/L (ref 22–32)
CREATININE: 1.22 mg/dL (ref 0.61–1.24)
Calcium: 8 mg/dL — ABNORMAL LOW (ref 8.9–10.3)
GFR calc non Af Amer: 51 mL/min — ABNORMAL LOW (ref >60)
GFR, EST AFRICAN AMERICAN: 60 mL/min — AB (ref >60)
Glucose, Bld: 112 mg/dL — ABNORMAL HIGH (ref 65–99)
POTASSIUM: 3.9 mmol/L (ref 3.5–5.1)
SODIUM: 138 mmol/L (ref 135–145)

## 2015-08-25 LAB — PROTIME-INR
INR: 1.12 (ref 0.00–1.49)
PROTHROMBIN TIME: 14.6 s (ref 11.6–15.2)

## 2015-08-25 LAB — PREPARE RBC (CROSSMATCH)

## 2015-08-25 LAB — ABO/RH: ABO/RH(D): O NEG

## 2015-08-25 MED ORDER — SENNA 8.6 MG PO TABS: 1.0000 | ORAL_TABLET | Freq: Every day | ORAL | Status: DC

## 2015-08-25 MED ORDER — POLYETHYLENE GLYCOL 3350 17 G PO PACK: 17.0000 g | PACK | Freq: Every day | ORAL | Status: DC

## 2015-08-25 MED ORDER — SODIUM CHLORIDE 0.9 % IV SOLN
Freq: Once | INTRAVENOUS | Status: AC
  Administered 2015-08-25: 11:00:00 via INTRAVENOUS

## 2015-08-25 MED ORDER — HYDROCODONE-ACETAMINOPHEN 5-325 MG PO TABS: 1.0000 | ORAL_TABLET | Freq: Four times a day (QID) | ORAL | Status: DC | PRN

## 2015-08-25 MED ORDER — FUROSEMIDE 10 MG/ML IJ SOLN
20.0000 mg | Freq: Once | INTRAMUSCULAR | Status: AC
  Administered 2015-08-25: 20 mg via INTRAVENOUS
  Filled 2015-08-25: qty 2

## 2015-08-25 MED ORDER — ENSURE ENLIVE PO LIQD: 237.0000 mL | Freq: Three times a day (TID) | ORAL | Status: DC

## 2015-08-25 MED ORDER — ACETAMINOPHEN 325 MG PO TABS: 650.0000 mg | ORAL_TABLET | Freq: Four times a day (QID) | ORAL | Status: DC | PRN

## 2015-08-25 MED ORDER — FERROUS SULFATE 325 (65 FE) MG PO TABS: 325.0000 mg | ORAL_TABLET | Freq: Two times a day (BID) | ORAL | Status: DC

## 2015-08-25 MED ORDER — GABAPENTIN 100 MG PO CAPS: 200.0000 mg | ORAL_CAPSULE | Freq: Every day | ORAL | Status: DC

## 2015-08-25 NOTE — Progress Notes (Signed)
CSW continuing to follow.   Pt plans to go to [facility] upon discharge.  Per MD, anticipate discharge tomorrow.   CSW met with pt and pt family at bedside to provide support and discussed plans for transition to [facility] tomorrow.   Pt family plans to arrange with [facility] re: admission paperwork.   Weekend Education officer, museum to facilitate pt discharge needs to [facility].  CSW to continue to follow.  Alison Murray, MSW, [place] Work [phone]

## 2015-08-25 NOTE — Discharge Summary (Addendum)
Physician Discharge Summary  Joseph [name] ZOX:[id number] DOB: 07-14-1926 DOA: 08/23/2015  PCP: Rudi Heap, MD  Admit date: 08/23/2015 Discharge date: 08/26/2015  Time spent: 35 minutes  Recommendations for Outpatient Follow-up:  Needs further evaluation for hematuria. Needs repeat UA.  Needs CBC to follow Hb.  Needs to follow up with Dr August Saucer post surgery.   Discharge Diagnoses:    Intertrochanteric fracture of left hip (HCC)  Aemia, acute blood l;oss post surgery.    Hypothyroid   Alzheimer's dementia without behavioral disturbance   Hip fracture (HCC)   Chronic kidney disease, stage III (moderate)   Lower extremity neuropathy   Leukocytosis   Arthropathy of cervical spine   Hematuria   Discharge Condition: stable.   Diet recommendation: Heart Healthy  Filed Weights   08/23/15 0405 08/23/15 1430  Weight: 68.04 kg (150 lb) 68 kg (149 lb 14.6 oz)    History of present illness:  Joseph [name] is a 81 y.o. male with a history of Alzheimer's dementia, lower extremity neuropathy, and hypothyroidism, who presents to the emergency department with a complaint of left leg/hip pain. The patient is alert, but is chronically confused from his Alzheimer's dementia, therefore, history is being provided by the patient's children, Joseph [name] and Joseph Hua. Accordingly, the patient was in his usual state of health. Apparently, he got up to use the bathroom early this morning. His wife heard him fall and found him on the floor. There was no obvious loss of consciousness or head trauma, but he did complain of left hip pain.  Hospital Course:  1-Fall at home resulting in an acute intertrochanteric fracture of the left hip. S/P Left hip intertrochanteric Fracture open reduction and internal fixation.  PT per ortho. No WB left leg.  DVT prophylaxis aspirin per ortho.   Arthropathy of the cervical spine: MRI No MR findings to suggest an acute cervical spine fracture or cord contusion.   Anemia; Blood  loss post surgery. Hb lower today. Will transfuse one unit PRBC. No evidence of active bleeding. mon Monitor hb after transfusion, repeat Hb in am.   Hematuria. Urine culture no growth to date. He will need repeat UA outpatient and further evaluation as needed.   Mild hyperglycemia, Hb A1c at 5.7, pre diabetic. Diabetes educator consulted.   Chronic neuropath; continue gabapentin, lower dose to avoid sedation.   Hypothyroidism. continue Synthroid. TSH at 1.55.  Alzheimer's dementia. Currently stable. continue Namenda and Aricept.   Chronic kidney disease, stage II to stage 3. The patient's baseline creatinine per chart review is 1.3-1.5. It is currently at baseline. Leukocytosis; trending down. Chest x ray, UA negative.   Procedures: Left hip intertrochanteric fracture open reduction and internal fixation. 10-12  Consultations:  Dr August Saucer.   Discharge Exam: Filed Vitals:   08/25/15 0559  BP: 114/61  Pulse: 111  Temp: 98.9 F (37.2 C)  Resp: 16    General: NAD Cardiovascular: S 1, S 2 RRR Respiratory: CTA Left hip with clean dressing.   Discharge Instructions   Discharge Instructions    Non weight bearing    Complete by:  As directed           Current Discharge Medication List    START taking these medications   Details  acetaminophen (TYLENOL) 325 MG tablet Take 2 tablets (650 mg total) by mouth every 6 (six) hours as needed for mild pain (or Fever >/= 101). Qty: 30 tablet, Refills: 0    aspirin EC 325 MG EC tablet Take  1 tablet (325 mg total) by mouth daily with breakfast. Qty: 30 tablet, Refills: 0    feeding supplement, ENSURE ENLIVE, (ENSURE ENLIVE) LIQD Take 237 mLs by mouth 3 (three) times daily between meals. Qty: 237 mL, Refills: 12    ferrous sulfate 325 (65 FE) MG tablet Take 1 tablet (325 mg total) by mouth 2 (two) times daily with a meal. Qty: 60 tablet, Refills: 3    !! HYDROcodone-acetaminophen (NORCO/VICODIN) 5-325 MG tablet Take 1-2  tablets by mouth every 6 (six) hours as needed for moderate pain. Qty: 30 tablet, Refills: 0    !! HYDROcodone-acetaminophen (NORCO/VICODIN) 5-325 MG tablet Take 1 tablet by mouth every 6 (six) hours as needed for moderate pain. Qty: 30 tablet, Refills: 0    polyethylene glycol (MIRALAX / GLYCOLAX) packet Take 17 g by mouth daily. Qty: 14 each, Refills: 0    senna (SENOKOT) 8.6 MG TABS tablet Take 1 tablet (8.6 mg total) by mouth at bedtime. Qty: 120 each, Refills: 0     !! - Potential duplicate medications found. Please discuss with provider.    CONTINUE these medications which have CHANGED   Details  gabapentin (NEURONTIN) 100 MG capsule Take 2 capsules (200 mg total) by mouth at bedtime. Qty: 30 capsule, Refills: 0      CONTINUE these medications which have NOT CHANGED   Details  Cholecalciferol (VITAMIN D3) 2000 UNITS capsule Take 2,000 Units by mouth daily.    donepezil (ARICEPT) 10 MG tablet TAKE 1 TABLET DAILY Qty: 90 tablet, Refills: 0    levothyroxine (SYNTHROID, LEVOTHROID) 75 MCG tablet Take 1 tablet (75 mcg total) by mouth daily. Qty: 90 tablet, Refills: 3    NAMENDA XR 28 MG CP24 24 hr capsule TAKE (1) CAPSULE DAILY Qty: 30 capsule, Refills: 2       Allergies  Allergen Reactions  . Naproxen Other (See Comments)    unknown  . Sulfa Antibiotics Other (See Comments)    unknown   Follow-up Information    Follow up with Rudi Heap, MD In 1 week.   Specialty:  Family Medicine   Contact information:   [address] [place] [address] [phone]       Follow up with Cammy Copa, MD In 2 weeks.   Specialty:  Orthopedic Surgery   Contact information:   [address] [place] [address] [phone]        The results of significant diagnostics from this hospitalization (including imaging, microbiology, ancillary and laboratory) are listed below for reference.    Significant Diagnostic Studies: Ct Head Wo  Contrast  08/23/2015  CLINICAL DATA:  Trip and fall injury while going to the restroom this morning. Altered level of consciousness. Alzheimer disease. EXAM: CT HEAD WITHOUT CONTRAST CT CERVICAL SPINE WITHOUT CONTRAST TECHNIQUE: Multidetector CT imaging of the head and cervical spine was performed following the standard protocol without intravenous contrast. Multiplanar CT image reconstructions of the cervical spine were also generated. COMPARISON:  None. FINDINGS: CT HEAD FINDINGS Examination is technically limited due to motion artifact. Diffuse cerebral atrophy. Ventricular dilatation consistent with central atrophy. Patchy low-attenuation changes in the deep white matter consistent with small vessel ischemia. No mass effect or midline shift. No abnormal extra-axial fluid collections. Gray-white matter junctions are distinct. Basal cisterns are not effaced. No evidence of acute intracranial hemorrhage. No depressed skull fractures. Partial opacification or sclerosis in the left mastoid air cells. Right mastoid air cells are not opacified. Mucosal thickening demonstrated in the paranasal  sinuses. Vascular calcifications. CT CERVICAL SPINE FINDINGS Diffuse degenerative change throughout the cervical spine with narrowed cervical interspaces and associated endplate hypertrophic changes. There is mild anterior subluxation of C3 on C4 and slight anterior subluxation of C7 on T1. This may be due to degenerative change but ligamentous injury could also have this appearance and is not excluded. Correlation physical examination is recommended. Degenerative changes throughout the cervical facet joints. No vertebral compression deformities. No prevertebral soft tissue swelling. C1-2 articulation appears intact. No focal bone lesion or bone destruction. Consolidation or scarring demonstrated in the lung apices. IMPRESSION: No acute intracranial abnormalities. Chronic atrophy and small vessel ischemic changes. Diffuse  degenerative change throughout the cervical spine. Mild anterior subluxation at C3-4 and C7-T1. These changes may be degenerative but ligamentous injury could also have this appearance and is not excluded. No acute displaced fractures identified. Electronically Signed   By: Burman Nieves M.D.   On: 08/23/2015 05:46   Ct Cervical Spine Wo Contrast  08/23/2015  CLINICAL DATA:  Trip and fall injury while going to the restroom this morning. Altered level of consciousness. Alzheimer disease. EXAM: CT HEAD WITHOUT CONTRAST CT CERVICAL SPINE WITHOUT CONTRAST TECHNIQUE: Multidetector CT imaging of the head and cervical spine was performed following the standard protocol without intravenous contrast. Multiplanar CT image reconstructions of the cervical spine were also generated. COMPARISON:  None. FINDINGS: CT HEAD FINDINGS Examination is technically limited due to motion artifact. Diffuse cerebral atrophy. Ventricular dilatation consistent with central atrophy. Patchy low-attenuation changes in the deep white matter consistent with small vessel ischemia. No mass effect or midline shift. No abnormal extra-axial fluid collections. Gray-white matter junctions are distinct. Basal cisterns are not effaced. No evidence of acute intracranial hemorrhage. No depressed skull fractures. Partial opacification or sclerosis in the left mastoid air cells. Right mastoid air cells are not opacified. Mucosal thickening demonstrated in the paranasal sinuses. Vascular calcifications. CT CERVICAL SPINE FINDINGS Diffuse degenerative change throughout the cervical spine with narrowed cervical interspaces and associated endplate hypertrophic changes. There is mild anterior subluxation of C3 on C4 and slight anterior subluxation of C7 on T1. This may be due to degenerative change but ligamentous injury could also have this appearance and is not excluded. Correlation physical examination is recommended. Degenerative changes throughout the  cervical facet joints. No vertebral compression deformities. No prevertebral soft tissue swelling. C1-2 articulation appears intact. No focal bone lesion or bone destruction. Consolidation or scarring demonstrated in the lung apices. IMPRESSION: No acute intracranial abnormalities. Chronic atrophy and small vessel ischemic changes. Diffuse degenerative change throughout the cervical spine. Mild anterior subluxation at C3-4 and C7-T1. These changes may be degenerative but ligamentous injury could also have this appearance and is not excluded. No acute displaced fractures identified. Electronically Signed   By: Burman Nieves M.D.   On: 08/23/2015 05:46   Mr Cervical Spine Wo Contrast  08/23/2015  CLINICAL DATA:  Larey Seat on 08/23/2015 and injured neck. Persistent pain. EXAM: MRI CERVICAL SPINE WITHOUT CONTRAST TECHNIQUE: Multiplanar, multisequence MR imaging of the cervical spine was performed. No intravenous contrast was administered. COMPARISON:  CT scan, same date. FINDINGS: Advanced degenerative cervical spondylosis with multilevel disc disease and facet disease. Moderate C1-2 degenerative changes with pannus formation and mild narrowing of the ventral CSF space in the upper cervical cord region. The vertebral bodies demonstrate normal marrow signal. No findings to suggest an acute fracture. No abnormal increased STIR signal intensity in the posterior elements, paraspinal muscles or facets. The cervical spinal cord demonstrates  normal signal intensity. No cord contusion or syrinx. C2-3:  No significant findings. C3-4: Degenerative anterior subluxation of C3 due to advanced facet disease. There is a bulging uncovered disc, osteophytic ridging and uncinate spurring contributing to flattening of the ventral thecal sac and narrowing of the ventral CSF space. There is moderate foraminal stenosis bilaterally, left slightly worse than right. C4-5: Bulging annulus, osteophytic ridging, uncinate spurring and asymmetric  right-sided facet disease. There is flattening of the ventral thecal sac and narrowing of the ventral CSF space. There is mild left foraminal stenosis and moderate to moderately severe right foraminal stenosis. C5-6: Right-sided disc osteophyte complex and right-sided facet disease contributing to moderate to moderately severe right foraminal stenosis. No spinal stenosis. Mild left foraminal encroachment. C6-7: Degenerative disc disease and right-sided facet disease. No significant spinal stenosis. There is mild bilateral foraminal stenosis. C7-T1:  No significant findings. IMPRESSION: 1. No MR findings to suggest an acute cervical spine fracture or cord contusion. No abnormal prevertebral soft tissue swelling. 2. Advanced degenerative cervical spondylosis with multilevel disc disease and facet disease as demonstrated on the CT scan. 3. Mild spinal and moderate bilateral foraminal stenosis at C3-4. 4. Mild spinal and mild left foraminal stenosis at C4-5. There is also moderately severe right foraminal stenosis. 5. Right-sided disc osteophyte complex at C5-6 with moderate to moderately severe right foraminal stenosis. 6. Mild bilateral foraminal stenosis at C6-7. Electronically Signed   By: Rudie Meyer M.D.   On: 08/23/2015 11:04   Pelvis Portable  08/24/2015  CLINICAL DATA:  81 year old male status post left hip ORIF. Initial encounter. EXAM: PORTABLE PELVIS 1-2 VIEWS COMPARISON:  Intraoperative images 20 302 hr today. FINDINGS: Portable AP view at 2348 hrs. Partially visible left femur ORIF hardware appears stable. Stable alignment about the intertrochanteric fracture. Lesser trochanter fragment mildly retracted. No other acute osseous abnormality identified. Foley catheter in place. IMPRESSION: Status post Left femur ORIF. Lesser trochanter butterfly fragment mildly retracted. Electronically Signed   By: Odessa Fleming M.D.   On: 08/24/2015 00:16   Dg Chest Portable 1 View  08/23/2015  CLINICAL DATA:  Fractured  left hip.  Preoperative chest x-ray. EXAM: PORTABLE CHEST 1 VIEW COMPARISON:  09/30/2013 FINDINGS: Heart size and pulmonary vascularity are normal for technique. Slightly shallow inspiration. No focal airspace disease or consolidation in the lungs. No blunting of costophrenic angles. No pneumothorax. Tortuous aorta. Mediastinal contours appear intact. IMPRESSION: No active disease. Electronically Signed   By: Burman Nieves M.D.   On: 08/23/2015 06:49   Dg Knee Complete 4 Views Right  08/23/2015  CLINICAL DATA:  Patient fell on the left side in complains of right knee pain. EXAM: RIGHT KNEE - COMPLETE 4+ VIEW COMPARISON:  None. FINDINGS: Degenerative changes in the right knee with tricompartment narrowing and osteophytosis. Prominent chondrocalcinosis. No significant effusion. No evidence of acute fracture or subluxation. No focal bone lesion or bone destruction. Bone cortex and trabecular architecture appear intact. No radiopaque soft tissue foreign bodies. Vascular calcifications. IMPRESSION: Degenerative changes in the right knee. No acute bony abnormalities. Electronically Signed   By: Burman Nieves M.D.   On: 08/23/2015 05:59   Dg C-arm 1-60 Min-no Report  08/23/2015  CLINICAL DATA: surgery C-ARM 1-60 MINUTES Fluoroscopy was utilized by the requesting physician.  No radiographic interpretation.   Dg Hip Unilat With Pelvis 2-3 Views Left  08/23/2015  CLINICAL DATA:  Patient fell on the left side.  Left hip pain. EXAM: DG HIP (WITH OR WITHOUT PELVIS) 2-3V LEFT COMPARISON:  None. FINDINGS: Acute comminuted posttraumatic inter trochanteric fractures of the left hip with mild varus angulation and impaction of the fracture fragments. Mild displacement of greater and lesser trochanteric fragments. Degenerative changes in the left hip. No evidence of left hip dislocation. No focal bone lesion or bone destruction. Vascular calcifications. Pelvis appears intact. SI joints and symphysis pubis are not  displaced. IMPRESSION: Acute comminuted inter trochanteric fractures in the left hip. Electronically Signed   By: Burman NievesWilliam  Stevens M.D.   On: 08/23/2015 05:58   Dg Femur Min 2 Views Left  08/23/2015  CLINICAL DATA:  81 year old male undergoing left femur ORIF. Initial encounter. EXAM: DG C-ARM 1-60 MIN-NO REPORT; LEFT FEMUR 2 VIEWS COMPARISON:  0522 hr today. FINDINGS: 3 intraoperative fluoroscopic views of the left femur demonstrate intra medullary rod placement with proximal inner interlocking dynamic hip screw. Improved alignment about the comminuted intertrochanteric fracture. IMPRESSION: ORIF left femur with no adverse features. FLUOROSCOPY TIME:  2 min 14 seconds Electronically Signed   By: Odessa FlemingH  Hall M.D.   On: 08/23/2015 23:33    Microbiology: Recent Results (from the past 240 hour(s))  Culture, Urine     Status: None   Collection Time: 08/23/15  4:49 AM  Result Value Ref Range Status   Specimen Description URINE, CLEAN CATCH  Final   Special Requests NONE  Final   Culture   Final    NO GROWTH 1 DAY [facility]    Report Status 08/24/2015 FINAL  Final  Surgical pcr screen     Status: Abnormal   Collection Time: 08/23/15  3:42 PM  Result Value Ref Range Status   MRSA, PCR NEGATIVE NEGATIVE Final   Staphylococcus aureus POSITIVE (A) NEGATIVE Final    Comment:        The Xpert SA Assay (FDA approved for NASAL specimens in patients over 79 years of age), is one component of a comprehensive surveillance program.  Test performance has been validated by [facility] for patients greater than or equal to 723 year old. It is not intended to diagnose infection nor to guide or monitor treatment.      Labs: Basic Metabolic Panel:  Recent Labs Lab 08/23/15 0600 08/24/15 0455 08/25/15 0445  NA 137 139 138  K 3.7 4.3 3.9  CL 102 108 108  CO2 26 26 27   GLUCOSE 140* 146* 112*  BUN 22* 24* 23*  CREATININE 1.43* 1.42* 1.22  CALCIUM 9.3 8.0* 8.0*   Liver  Function Tests:  Recent Labs Lab 08/24/15 0455  AST 35  ALT 13*  ALKPHOS 55  BILITOT 0.7  PROT 5.7*  ALBUMIN 3.0*   No results for input(s): LIPASE, AMYLASE in the last 168 hours. No results for input(s): AMMONIA in the last 168 hours. CBC:  Recent Labs Lab 08/23/15 0600 08/24/15 0455 08/25/15 0445  WBC 15.2* 13.5* 12.0*  NEUTROABS 11.7*  --   --   HGB 14.0 10.0* 8.1*  HCT 41.1 30.0* 24.5*  MCV 95.4 97.4 96.8  PLT 185 170 129*   Cardiac Enzymes: No results for input(s): CKTOTAL, CKMB, CKMBINDEX, TROPONINI in the last 168 hours. BNP: BNP (last 3 results) No results for input(s): BNP in the last 8760 hours.  ProBNP (last 3 results) No results for input(s): PROBNP in the last 8760 hours.  CBG: No results for input(s): GLUCAP in the last 168 hours.     SignedHartley Barefoot:  Abhiram Criado A  [facility] 08/25/2015, 8:54 AM

## 2015-08-25 NOTE — Progress Notes (Signed)
[facility] PROGRESS NOTE  Joseph [name] ZOX:[id number] DOB: August 03, 1926 DOA: 08/23/2015 PCP: Rudi Heap, MD  Assessment/Plan: 1-Fall at home resulting in an acute intertrochanteric fracture of the left hip. S/P Left hip intertrochanteric Fracture open reduction and internal fixation.  PT per ortho.  DVT prophylaxis aspirin per ortho.   Arthropathy of the cervical spine: MRI No MR findings to suggest an acute cervical spine fracture or cord contusion.   Anemia; Blood loss post surgery. Hb at 8. Will transfuse one unit, repeat labs in am.   Hematuria. Urine culture no growth to date. He will need repeat UA outpatient and further evaluation as needed.   Mild hyperglycemia, Hb A1c at 5.7, pre diabetic. Diabetes educator consulted.   Chronic neuropath; continue gabapentin, lower dose to avoid sedation.   Hypothyroidism.  continue Synthroid.  TSH at 1.55.  Alzheimer's dementia. Currently stable. continue Namenda and Aricept.   Chronic kidney disease, stage II to stage 3. The patient's baseline creatinine per chart review is 1.3-1.5. It is currently at baseline.   Code Status: Full Code.  Family Communication: care discussed with son who was at bedside.  Disposition Plan: needs SNF   Consultants:  Dr August Saucer  Procedures: Left hip intertrochanteric fracture open reduction and internal fixation. 10-12  Antibiotics:  prophylaxis dose for surgery   HPI/Subjective: More alert today, no complaints.   Objective: Filed Vitals:   08/25/15 0559  BP: 114/61  Pulse: 111  Temp: 98.9 F (37.2 C)  Resp: 16    Intake/Output Summary (Last 24 hours) at 08/25/15 0957 Last data filed at 08/25/15 8119  Gross per 24 hour  Intake 1556.25 ml  Output   1050 ml  Net 506.25 ml   Filed Weights   08/23/15 0405 08/23/15 1430  Weight: 68.04 kg (150 lb) 68 kg (149 lb 14.6 oz)    Exam:   General:  Sleepy, but wake up answer some question.  Cardiovascular: S 1, S 2  RRR  Respiratory: CTA  Abdomen: BS present, soft, nt  Musculoskeletal: no edema  Data Reviewed: Basic Metabolic Panel:  Recent Labs Lab 08/23/15 0600 08/24/15 0455 08/25/15 0445  NA 137 139 138  K 3.7 4.3 3.9  CL 102 108 108  CO2 GLUCOSE 140* 146* 112*  BUN 22* 24* 23*  CREATININE 1.43* 1.42* 1.22  CALCIUM 9.3 8.0* 8.0*   Liver Function Tests:  Recent Labs Lab 08/24/15 0455  AST 35  ALT 13*  ALKPHOS 55  BILITOT 0.7  PROT 5.7*  ALBUMIN 3.0*   No results for input(s): LIPASE, AMYLASE in the last 168 hours. No results for input(s): AMMONIA in the last 168 hours. CBC:  Recent Labs Lab 08/23/15 0600 08/24/15 0455 08/25/15 0445  WBC 15.2* 13.5* 12.0*  NEUTROABS 11.7*  --   --   HGB 14.0 10.0* 8.1*  HCT 41.1 30.0* 24.5*  MCV 95.4 97.4 96.8  PLT 185 170 129*   Cardiac Enzymes: No results for input(s): CKTOTAL, CKMB, CKMBINDEX, TROPONINI in the last 168 hours. BNP (last 3 results) No results for input(s): BNP in the last 8760 hours.  ProBNP (last 3 results) No results for input(s): PROBNP in the last 8760 hours.  CBG: No results for input(s): GLUCAP in the last 168 hours.  Recent Results (from the past 240 hour(s))  Culture, Urine     Status: None   Collection Time: 08/23/15  4:49 AM  Result Value Ref Range Status   Specimen Description URINE, CLEAN CATCH  Final   Special Requests NONE  Final   Culture   Final    NO GROWTH 1 DAY Performed at [facility]    Report Status 08/24/2015 FINAL  Final  Surgical pcr screen     Status: Abnormal   Collection Time: 08/23/15  3:42 PM  Result Value Ref Range Status   MRSA, PCR NEGATIVE NEGATIVE Final   Staphylococcus aureus POSITIVE (A) NEGATIVE Final    Comment:        The Xpert SA Assay (FDA approved for NASAL specimens in patients over 83 years of age), is one component of a comprehensive surveillance program.  Test performance has been validated by [facility] for patients  greater than or equal to 67 year old. It is not intended to diagnose infection nor to guide or monitor treatment.      Studies: Mr Cervical Spine Wo Contrast  08/23/2015  CLINICAL DATA:  Larey Seat on 08/23/2015 and injured neck. Persistent pain. EXAM: MRI CERVICAL SPINE WITHOUT CONTRAST TECHNIQUE: Multiplanar, multisequence MR imaging of the cervical spine was performed. No intravenous contrast was administered. COMPARISON:  CT scan, same date. FINDINGS: Advanced degenerative cervical spondylosis with multilevel disc disease and facet disease. Moderate C1-2 degenerative changes with pannus formation and mild narrowing of the ventral CSF space in the upper cervical cord region. The vertebral bodies demonstrate normal marrow signal. No findings to suggest an acute fracture. No abnormal increased STIR signal intensity in the posterior elements, paraspinal muscles or facets. The cervical spinal cord demonstrates normal signal intensity. No cord contusion or syrinx. C2-3:  No significant findings. C3-4: Degenerative anterior subluxation of C3 due to advanced facet disease. There is a bulging uncovered disc, osteophytic ridging and uncinate spurring contributing to flattening of the ventral thecal sac and narrowing of the ventral CSF space. There is moderate foraminal stenosis bilaterally, left slightly worse than right. C4-5: Bulging annulus, osteophytic ridging, uncinate spurring and asymmetric right-sided facet disease. There is flattening of the ventral thecal sac and narrowing of the ventral CSF space. There is mild left foraminal stenosis and moderate to moderately severe right foraminal stenosis. C5-6: Right-sided disc osteophyte complex and right-sided facet disease contributing to moderate to moderately severe right foraminal stenosis. No spinal stenosis. Mild left foraminal encroachment. C6-7: Degenerative disc disease and right-sided facet disease. No significant spinal stenosis. There is mild bilateral  foraminal stenosis. C7-T1:  No significant findings. IMPRESSION: 1. No MR findings to suggest an acute cervical spine fracture or cord contusion. No abnormal prevertebral soft tissue swelling. 2. Advanced degenerative cervical spondylosis with multilevel disc disease and facet disease as demonstrated on the CT scan. 3. Mild spinal and moderate bilateral foraminal stenosis at C3-4. 4. Mild spinal and mild left foraminal stenosis at C4-5. There is also moderately severe right foraminal stenosis. 5. Right-sided disc osteophyte complex at C5-6 with moderate to moderately severe right foraminal stenosis. 6. Mild bilateral foraminal stenosis at C6-7. Electronically Signed   By: Rudie Meyer M.D.   On: 08/23/2015 11:04   Pelvis Portable  08/24/2015  CLINICAL DATA:  81 year old male status post left hip ORIF. Initial encounter. EXAM: PORTABLE PELVIS 1-2 VIEWS COMPARISON:  Intraoperative images 20 302 hr today. FINDINGS: Portable AP view at 2348 hrs. Partially visible left femur ORIF hardware appears stable. Stable alignment about the intertrochanteric fracture. Lesser trochanter fragment mildly retracted. No other acute osseous abnormality identified. Foley catheter in place. IMPRESSION: Status post Left femur ORIF. Lesser trochanter butterfly fragment mildly retracted. Electronically Signed   By:  Odessa FlemingH  Hall M.D.   On: 08/24/2015 00:16   Dg C-arm 1-60 Min-no Report  08/23/2015  CLINICAL DATA: surgery C-ARM 1-60 MINUTES Fluoroscopy was utilized by the requesting physician.  No radiographic interpretation.   Dg Femur Min 2 Views Left  08/23/2015  CLINICAL DATA:  81 year old male undergoing left femur ORIF. Initial encounter. EXAM: DG C-ARM 1-60 MIN-NO REPORT; LEFT FEMUR 2 VIEWS COMPARISON:  0522 hr today. FINDINGS: 3 intraoperative fluoroscopic views of the left femur demonstrate intra medullary rod placement with proximal inner interlocking dynamic hip screw. Improved alignment about the comminuted  intertrochanteric fracture. IMPRESSION: ORIF left femur with no adverse features. FLUOROSCOPY TIME:  2 min 14 seconds Electronically Signed   By: Odessa FlemingH  Hall M.D.   On: 08/23/2015 23:33    Scheduled Meds: . sodium chloride   Intravenous Once  . aspirin EC  325 mg Oral Q breakfast  .  ceFAZolin (ANCEF) IV  2 g Intravenous 3 times per day  . cholecalciferol  2,000 Units Oral Daily  . donepezil  10 mg Oral Daily  . feeding supplement (ENSURE ENLIVE)  237 mL Oral TID BM  . furosemide  20 mg Intravenous Once  . gabapentin  200 mg Oral QHS  . levothyroxine  75 mcg Oral Daily  . memantine  28 mg Oral Daily  . polyethylene glycol  17 g Oral Daily  . senna  1 tablet Oral QHS   Continuous Infusions: . sodium chloride 75 mL/hr at 08/25/15 [id number]    Principal Problem:   Intertrochanteric fracture of left hip (HCC) Active Problems:   Hypothyroid   Alzheimer's dementia without behavioral disturbance   Hip fracture (HCC)   Chronic kidney disease, stage III (moderate)   Lower extremity neuropathy   Leukocytosis   Arthropathy of cervical spine   Hematuria   Closed left hip fracture (HCC)    Time spent: 25 minutes.     Hartley Barefootegalado, Laurielle Selmon A  [facility] Pager [phone]. If 7PM-7AM, please contact night-coverage at [email or web address], password [email or web address] 08/25/2015, 9:57 AM  LOS: 2 days

## 2015-08-25 NOTE — Progress Notes (Signed)
Physical Therapy Treatment Patient Details Name: Joseph [name] MRN: [id number] DOB: Jan 09, 1926 Today's Date: 08/25/2015    History of Present Illness Pt s/p fall with L hip fx and IM nail repair.  Pt with hx of LE neuropathy, CKD and dementia    PT Comments    Assisted pt from supine to EOB + 2 assist.  Assisted with staic standing using EVA walker and by therapist foot under pt's L foot to ensure NWB.  Assisted back to bed and applied ICE to hip.   Follow Up Recommendations  SNF     Equipment Recommendations       Recommendations for Other Services       Precautions / Restrictions Precautions Precautions: Fall Precaution Comments: HOH Restrictions Weight Bearing Restrictions: Yes LLE Weight Bearing: Non weight bearing    Mobility  Bed Mobility Overal bed mobility: Needs Assistance;+2 for physical assistance;+ 2 for safety/equipment Bed Mobility: Supine to Sit;Sit to Supine     Supine to sit: Joseph [name] assist;+2 for physical assistance;+2 for safety/equipment Sit to supine: Joseph [name] assist;+2 for physical assistance;+2 for safety/equipment   General bed mobility comments: Assist with bil LEs and to control trunk.  Pt assisted to EOB with pad   Once upright, pt was able to staic sit x 7 min at Supervision level.  Transfers Overall transfer level: Needs assistance Equipment used: None Transfers: Sit to/from Stand Sit to Stand: Joseph [name] assist;+2 physical assistance;+2 safety/equipment;Total assist         General transfer comment: repeat one word VC's and tactile cueing on proper hand placement.  Static standing x 90 sec + 2 assist and therapist foot under pt's L foot to ensure NWB and attempt education to pt.    Ambulation/Gait             General Gait Details: pt was able to stand and maitain NWB with therapist foot under pt's.  Pt was unable to attempt gait using this tech.     Stairs            Wheelchair Mobility    Modified Rankin (Stroke Patients Only)        Balance                                    Cognition Arousal/Alertness: Awake/alert Behavior During Therapy: Flat affect Overall Cognitive Status: History of cognitive impairments - at baseline Area of Impairment: Following commands;Memory;Orientation;Safety/judgement Orientation Level: Place;Time;Situation                  Exercises      General Comments        Pertinent Vitals/Pain Pain Assessment: Faces Faces Pain Scale: Hurts a little bit Pain Location: pt was unable to verbalize location Pain Intervention(s): Monitored during session;Repositioned    Home Living                      Prior Function            PT Goals (current goals can now be found in the care plan section) Progress towards PT goals: Progressing toward goals    Frequency  Min 3X/week    PT Plan      Co-evaluation             End of Session Equipment Utilized During Treatment: Gait belt Activity Tolerance: Patient tolerated treatment well Patient left: in bed;with call bell/phone within reach  Time: 1010-1040 PT Time Calculation (min) (ACUTE ONLY): 30 min  Charges:  $Therapeutic Activity: 23-37 mins                    G Codes:      Joseph ShellingLori Ethie [name]  PTA [facility]  Acute  Rehab Pager      [phone]

## 2015-08-26 ENCOUNTER — Encounter (HOSPITAL_COMMUNITY)
Admission: RE | Admit: 2015-08-26 | Discharge: 2015-08-26 | Disposition: A | Discharge status: Home / Self Care | Payer: Medicare Other | Source: Skilled Nursing Facility | Attending: Internal Medicine | Admitting: Internal Medicine

## 2015-08-26 DIAGNOSIS — M25552 Pain in left hip: Secondary | ICD-10-CM | POA: Diagnosis not present

## 2015-08-26 DIAGNOSIS — M25551 Pain in right hip: Secondary | ICD-10-CM | POA: Diagnosis not present

## 2015-08-26 DIAGNOSIS — Z79899 Other long term (current) drug therapy: Secondary | ICD-10-CM | POA: Diagnosis not present

## 2015-08-26 DIAGNOSIS — R54 Age-related physical debility: Secondary | ICD-10-CM | POA: Diagnosis not present

## 2015-08-26 DIAGNOSIS — R55 Syncope and collapse: Secondary | ICD-10-CM | POA: Diagnosis not present

## 2015-08-26 DIAGNOSIS — E860 Dehydration: Secondary | ICD-10-CM | POA: Diagnosis not present

## 2015-08-26 DIAGNOSIS — R404 Transient alteration of awareness: Secondary | ICD-10-CM | POA: Diagnosis not present

## 2015-08-26 DIAGNOSIS — R319 Hematuria, unspecified: Secondary | ICD-10-CM | POA: Diagnosis not present

## 2015-08-26 DIAGNOSIS — N183 Chronic kidney disease, stage 3 (moderate): Secondary | ICD-10-CM | POA: Diagnosis not present

## 2015-08-26 DIAGNOSIS — S72142A Displaced intertrochanteric fracture of left femur, initial encounter for closed fracture: Secondary | ICD-10-CM | POA: Diagnosis not present

## 2015-08-26 DIAGNOSIS — R402411 Glasgow coma scale score 13-15, in the field [EMT or ambulance]: Secondary | ICD-10-CM | POA: Diagnosis not present

## 2015-08-26 DIAGNOSIS — R41841 Cognitive communication deficit: Secondary | ICD-10-CM | POA: Diagnosis not present

## 2015-08-26 DIAGNOSIS — G309 Alzheimer's disease, unspecified: Secondary | ICD-10-CM | POA: Diagnosis not present

## 2015-08-26 DIAGNOSIS — E039 Hypothyroidism, unspecified: Secondary | ICD-10-CM | POA: Diagnosis not present

## 2015-08-26 DIAGNOSIS — S72142D Displaced intertrochanteric fracture of left femur, subsequent encounter for closed fracture with routine healing: Secondary | ICD-10-CM | POA: Diagnosis not present

## 2015-08-26 DIAGNOSIS — N400 Enlarged prostate without lower urinary tract symptoms: Secondary | ICD-10-CM | POA: Diagnosis not present

## 2015-08-26 DIAGNOSIS — E559 Vitamin D deficiency, unspecified: Secondary | ICD-10-CM | POA: Diagnosis not present

## 2015-08-26 DIAGNOSIS — M6281 Muscle weakness (generalized): Secondary | ICD-10-CM | POA: Diagnosis not present

## 2015-08-26 DIAGNOSIS — F0280 Dementia in other diseases classified elsewhere without behavioral disturbance: Secondary | ICD-10-CM | POA: Diagnosis not present

## 2015-08-26 DIAGNOSIS — R1310 Dysphagia, unspecified: Secondary | ICD-10-CM | POA: Diagnosis not present

## 2015-08-26 DIAGNOSIS — R259 Unspecified abnormal involuntary movements: Secondary | ICD-10-CM | POA: Diagnosis not present

## 2015-08-26 DIAGNOSIS — R7989 Other specified abnormal findings of blood chemistry: Secondary | ICD-10-CM | POA: Diagnosis not present

## 2015-08-26 DIAGNOSIS — N189 Chronic kidney disease, unspecified: Secondary | ICD-10-CM | POA: Diagnosis not present

## 2015-08-26 DIAGNOSIS — R791 Abnormal coagulation profile: Secondary | ICD-10-CM | POA: Diagnosis not present

## 2015-08-26 DIAGNOSIS — L89623 Pressure ulcer of left heel, stage 3: Secondary | ICD-10-CM | POA: Diagnosis not present

## 2015-08-26 DIAGNOSIS — Z96649 Presence of unspecified artificial hip joint: Secondary | ICD-10-CM | POA: Diagnosis not present

## 2015-08-26 DIAGNOSIS — M4682 Other specified inflammatory spondylopathies, cervical region: Secondary | ICD-10-CM | POA: Diagnosis not present

## 2015-08-26 DIAGNOSIS — H109 Unspecified conjunctivitis: Secondary | ICD-10-CM | POA: Diagnosis not present

## 2015-08-26 DIAGNOSIS — Z87891 Personal history of nicotine dependence: Secondary | ICD-10-CM | POA: Diagnosis not present

## 2015-08-26 DIAGNOSIS — D649 Anemia, unspecified: Secondary | ICD-10-CM | POA: Diagnosis not present

## 2015-08-26 DIAGNOSIS — D62 Acute posthemorrhagic anemia: Secondary | ICD-10-CM | POA: Diagnosis not present

## 2015-08-26 DIAGNOSIS — L97411 Non-pressure chronic ulcer of right heel and midfoot limited to breakdown of skin: Secondary | ICD-10-CM | POA: Diagnosis not present

## 2015-08-26 DIAGNOSIS — G5790 Unspecified mononeuropathy of unspecified lower limb: Secondary | ICD-10-CM | POA: Diagnosis not present

## 2015-08-26 DIAGNOSIS — Z7982 Long term (current) use of aspirin: Secondary | ICD-10-CM | POA: Diagnosis not present

## 2015-08-26 DIAGNOSIS — E785 Hyperlipidemia, unspecified: Secondary | ICD-10-CM | POA: Diagnosis not present

## 2015-08-26 DIAGNOSIS — R262 Difficulty in walking, not elsewhere classified: Secondary | ICD-10-CM | POA: Diagnosis not present

## 2015-08-26 DIAGNOSIS — R278 Other lack of coordination: Secondary | ICD-10-CM | POA: Diagnosis not present

## 2015-08-26 DIAGNOSIS — R3981 Functional urinary incontinence: Secondary | ICD-10-CM | POA: Diagnosis not present

## 2015-08-26 DIAGNOSIS — S72002D Fracture of unspecified part of neck of left femur, subsequent encounter for closed fracture with routine healing: Secondary | ICD-10-CM | POA: Diagnosis not present

## 2015-08-26 DIAGNOSIS — E038 Other specified hypothyroidism: Secondary | ICD-10-CM | POA: Diagnosis not present

## 2015-08-26 LAB — CBC
HCT: 28.3 % — ABNORMAL LOW (ref 39.0–52.0)
Hemoglobin: 9.4 g/dL — ABNORMAL LOW (ref 13.0–17.0)
MCH: 30.4 pg (ref 26.0–34.0)
MCHC: 33.2 g/dL (ref 30.0–36.0)
MCV: 91.6 fL (ref 78.0–100.0)
PLATELETS: 143 10*3/uL — AB (ref 150–400)
RBC: 3.09 MIL/uL — ABNORMAL LOW (ref 4.22–5.81)
RDW: 15.5 % (ref 11.5–15.5)
WBC: 11.9 10*3/uL — AB (ref 4.0–10.5)

## 2015-08-26 LAB — PROTIME-INR
INR: 1.13 (ref 0.00–1.49)
Prothrombin Time: 14.7 seconds (ref 11.6–15.2)

## 2015-08-26 LAB — TYPE AND SCREEN
ABO/RH(D): O NEG
Antibody Screen: NEGATIVE
UNIT DIVISION: 0

## 2015-08-26 LAB — URINE MICROSCOPIC-ADD ON

## 2015-08-26 LAB — URINALYSIS, ROUTINE W REFLEX MICROSCOPIC
Bilirubin Urine: NEGATIVE
GLUCOSE, UA: 100 mg/dL — AB
Ketones, ur: NEGATIVE mg/dL
Leukocytes, UA: NEGATIVE
Nitrite: NEGATIVE
SPECIFIC GRAVITY, URINE: 1.02 (ref 1.005–1.030)
Urobilinogen, UA: 0.2 mg/dL (ref 0.0–1.0)
pH: 6 (ref 5.0–8.0)

## 2015-08-26 NOTE — Care Management Note (Signed)
Case Management Note  Patient Details  Name: Joseph [name] MRN: [id number] Date of Birth: 1926/07/16  Subjective/Objective:     Intertrochanteric fracture of left hip                Action/Plan: Scheduled dc to SNF. NCM spoke to to pt and gave permission to speak to dtr, [name] and wife. No NCM needs identified.   Expected Discharge Date:  08/26/2015               Expected Discharge Plan:  Skilled Nursing Facility  In-House Referral:  Clinical Social Work  Discharge planning Services  CM Consult    Status of Service:  Completed, signed off  Medicare Important Message Given:  Yes-second notification given Date Medicare IM Given:    Medicare IM give by:    Date Additional Medicare IM Given:    Additional Medicare Important Message give by:     If discussed at Long Length of Stay Meetings, dates discussed:    Additional Comments:  Elliot CousinShavis, Allee Busk Ellen, RN 08/26/2015, 10:37 AM

## 2015-08-26 NOTE — Clinical Social Work Placement (Signed)
   CLINICAL SOCIAL WORK PLACEMENT  NOTE  Date:  08/26/2015  Patient Details  Name: Joseph [name] MRN: [id number] Date of Birth: 08-02-1926  Clinical Social Work is seeking post-discharge placement for this patient at the Skilled  Nursing Facility level of care (*CSW will initial, date and re-position this form in  chart as items are completed):  Yes   Patient/family provided with [facility] Clinical Social Work Department's list of facilities offering this level of care within the geographic area requested by the patient (or if unable, by the patient's family).  Yes   Patient/family informed of their freedom to choose among providers that offer the needed level of care, that participate in Medicare, Medicaid or managed care program needed by the patient, have an available bed and are willing to accept the patient.  Yes   Patient/family informed of [facility]'s ownership interest [facility] and [facility], as well as of the fact that they are under no obligation to receive care at these facilities.  PASRR submitted to EDS on 08/24/15     PASRR number received on 08/24/15     Existing PASRR number confirmed on       FL2 transmitted to all facilities in geographic area requested by pt/family on 08/24/15     FL2 transmitted to all facilities within larger geographic area on       Patient informed that his/her managed care company has contracts with or will negotiate with certain facilities, including the following:        Yes   Patient/family informed of bed offers received.  [facility]     Physician recommends and patient chooses bed at      [facility]  Patient to be transferred to facility by  ambulance     Patient family notified on   August 25, 2014 of transfer.  Name of family member notified:      [name]/ Spouse and daughter at bedside  PHYSICIAN Please sign FL2      Additional Comment:    _______________________________________________ Annetta MawKujawa,Jakarri Lesko G, LCSW 08/26/2015, 10:23 AM

## 2015-08-26 NOTE — Progress Notes (Signed)
Pt discharged to snf [facility]). Left unit on stretcher pushed by ambulance staff. Left in good condition. Family at bedside at time of discharge and left just before pt was pushed off floor. [facility] to get report from this RN. Report given. All of Renee's questions answered to her satisfaction. This RN gave renee her phone number to be reached at just in case she had some concerns/questions. Pt left in good condition. All meds given this AM recorded on  AVS.  Vwilliams,rn.

## 2015-08-26 NOTE — Discharge Summary (Signed)
Physician Discharge Summary  Joseph [name] ZOX:[id number] DOB: 1926-01-07 DOA: 08/23/2015  PCP: Rudi HeapMOORE, DONALD, MD  Admit date: 08/23/2015 Discharge date: 08/26/2015  Time spent: 35 minutes  Recommendations for Outpatient Follow-up:  Needs further evaluation for hematuria. Needs repeat UA.  Needs CBC to follow Hb.  Needs to follow up with Joseph [name] post surgery.   Discharge Diagnoses:    Intertrochanteric fracture of left hip (HCC)  Aemia, acute blood l;oss post surgery.    Hypothyroid   Alzheimer's dementia without behavioral disturbance   Hip fracture (HCC)   Chronic kidney disease, stage III (moderate)   Lower extremity neuropathy   Leukocytosis   Arthropathy of cervical spine   Hematuria   Discharge Condition: stable.   Diet recommendation: Heart Healthy  Filed Weights   08/23/15 0405 08/23/15 1430  Weight: 68.04 kg (150 lb) 68 kg (149 lb 14.6 oz)    History of present illness:  Joseph [name] is a 81 y.o. male with a history of Alzheimer's dementia, lower extremity neuropathy, and hypothyroidism, who presents to the emergency department with a complaint of left leg/hip pain. The patient is alert, but is chronically confused from his Alzheimer's dementia, therefore, history is being provided by the patient's children, Joseph [name] and Joseph [name]. Accordingly, the patient was in his usual state of health. Apparently, he got up to use the bathroom early this morning. His wife heard him fall and found him on the floor. There was no obvious loss of consciousness or head trauma, but he did complain of left hip pain.  Hospital Course:  1-Fall at home resulting in an acute intertrochanteric fracture of the left hip. S/P Left hip intertrochanteric Fracture open reduction and internal fixation.  PT per ortho. No WB left leg.  DVT prophylaxis aspirin per ortho.   Arthropathy of the cervical spine: MRI No MR findings to suggest an acute cervical spine fracture or cord contusion.   Anemia; Blood  loss post surgery. Hb lower today. Will transfuse one unit PRBC. No evidence of active bleeding. mon Hb at 9.4, post transfusion  Hematuria. Urine culture no growth to date. He will need repeat UA outpatient and further evaluation as needed.   Mild hyperglycemia, Hb A1c at 5.7, pre diabetic. Diabetes educator consulted.   Chronic neuropath; continue gabapentin, lower dose to avoid sedation.   Hypothyroidism. continue Synthroid. TSH at 1.55.  Alzheimer's dementia. Currently stable. continue Namenda and Aricept.   Chronic kidney disease, stage II to stage 3. The patient's baseline creatinine per chart review is 1.3-1.5. It is currently at baseline. Leukocytosis; trending down. Chest x ray, UA negative.   Procedures: Left hip intertrochanteric fracture open reduction and internal fixation. 10-12  Consultations:  Joseph [name].   Discharge Exam: Filed Vitals:   08/26/15 0659  BP: 126/67  Pulse: 102  Temp: 98.1 F (36.7 C)  Resp: 16    General: NAD Cardiovascular: S 1, S 2 RRR Respiratory: CTA Left hip with clean dressing.   Discharge Instructions   Discharge Instructions    Diet - low sodium heart healthy    Complete by:  As directed      Increase activity slowly    Complete by:  As directed      Non weight bearing    Complete by:  As directed           Current Discharge Medication List    START taking these medications   Details  acetaminophen (TYLENOL) 325 MG tablet Take 2 tablets (650 mg  total) by mouth every 6 (six) hours as needed for mild pain (or Fever >/= 101). Qty: 30 tablet, Refills: 0    aspirin EC 325 MG EC tablet Take 1 tablet (325 mg total) by mouth daily with breakfast. Qty: 30 tablet, Refills: 0    feeding supplement, ENSURE ENLIVE, (ENSURE ENLIVE) LIQD Take 237 mLs by mouth 3 (three) times daily between meals. Qty: 237 mL, Refills: 12    ferrous sulfate 325 (65 FE) MG tablet Take 1 tablet (325 mg total) by mouth 2 (two) times daily with a  meal. Qty: 60 tablet, Refills: 3    !! HYDROcodone-acetaminophen (NORCO/VICODIN) 5-325 MG tablet Take 1-2 tablets by mouth every 6 (six) hours as needed for moderate pain. Qty: 30 tablet, Refills: 0    !! HYDROcodone-acetaminophen (NORCO/VICODIN) 5-325 MG tablet Take 1 tablet by mouth every 6 (six) hours as needed for moderate pain. Qty: 30 tablet, Refills: 0    polyethylene glycol (MIRALAX / GLYCOLAX) packet Take 17 g by mouth daily. Qty: 14 each, Refills: 0    senna (SENOKOT) 8.6 MG TABS tablet Take 1 tablet (8.6 mg total) by mouth at bedtime. Qty: 120 each, Refills: 0     !! - Potential duplicate medications found. Please discuss with provider.    CONTINUE these medications which have CHANGED   Details  gabapentin (NEURONTIN) 100 MG capsule Take 2 capsules (200 mg total) by mouth at bedtime. Qty: 30 capsule, Refills: 0      CONTINUE these medications which have NOT CHANGED   Details  Cholecalciferol (VITAMIN D3) 2000 UNITS capsule Take 2,000 Units by mouth daily.    donepezil (ARICEPT) 10 MG tablet TAKE 1 TABLET DAILY Qty: 90 tablet, Refills: 0    levothyroxine (SYNTHROID, LEVOTHROID) 75 MCG tablet Take 1 tablet (75 mcg total) by mouth daily. Qty: 90 tablet, Refills: 3    NAMENDA XR 28 MG CP24 24 hr capsule TAKE (1) CAPSULE DAILY Qty: 30 capsule, Refills: 2       Allergies  Allergen Reactions  . Naproxen Other (See Comments)    unknown  . Sulfa Antibiotics Other (See Comments)    unknown   Follow-up Information    Follow up with Rudi Heap, MD In 1 week.   Specialty:  Family Medicine   Contact information:   [address] [place] [address] [phone]       Follow up with Joseph Copa, MD In 2 weeks.   Specialty:  Orthopedic Surgery   Contact information:   [address] [place] [address] [phone]        The results of significant diagnostics from this hospitalization (including imaging, microbiology, ancillary and  laboratory) are listed below for reference.    Significant Diagnostic Studies: Ct Head Wo Contrast  08/23/2015  CLINICAL DATA:  Trip and fall injury while going to the restroom this morning. Altered level of consciousness. Alzheimer disease. EXAM: CT HEAD WITHOUT CONTRAST CT CERVICAL SPINE WITHOUT CONTRAST TECHNIQUE: Multidetector CT imaging of the head and cervical spine was performed following the standard protocol without intravenous contrast. Multiplanar CT image reconstructions of the cervical spine were also generated. COMPARISON:  None. FINDINGS: CT HEAD FINDINGS Examination is technically limited due to motion artifact. Diffuse cerebral atrophy. Ventricular dilatation consistent with central atrophy. Patchy low-attenuation changes in the deep white matter consistent with small vessel ischemia. No mass effect or midline shift. No abnormal extra-axial fluid collections. Gray-white matter junctions are distinct. Basal cisterns are not effaced. No evidence  of acute intracranial hemorrhage. No depressed skull fractures. Partial opacification or sclerosis in the left mastoid air cells. Right mastoid air cells are not opacified. Mucosal thickening demonstrated in the paranasal sinuses. Vascular calcifications. CT CERVICAL SPINE FINDINGS Diffuse degenerative change throughout the cervical spine with narrowed cervical interspaces and associated endplate hypertrophic changes. There is mild anterior subluxation of C3 on C4 and slight anterior subluxation of C7 on T1. This may be due to degenerative change but ligamentous injury could also have this appearance and is not excluded. Correlation physical examination is recommended. Degenerative changes throughout the cervical facet joints. No vertebral compression deformities. No prevertebral soft tissue swelling. C1-2 articulation appears intact. No focal bone lesion or bone destruction. Consolidation or scarring demonstrated in the lung apices. IMPRESSION: No acute  intracranial abnormalities. Chronic atrophy and small vessel ischemic changes. Diffuse degenerative change throughout the cervical spine. Mild anterior subluxation at C3-4 and C7-T1. These changes may be degenerative but ligamentous injury could also have this appearance and is not excluded. No acute displaced fractures identified. Electronically Signed   By: Burman Nieves M.D.   On: 08/23/2015 05:46   Ct Cervical Spine Wo Contrast  08/23/2015  CLINICAL DATA:  Trip and fall injury while going to the restroom this morning. Altered level of consciousness. Alzheimer disease. EXAM: CT HEAD WITHOUT CONTRAST CT CERVICAL SPINE WITHOUT CONTRAST TECHNIQUE: Multidetector CT imaging of the head and cervical spine was performed following the standard protocol without intravenous contrast. Multiplanar CT image reconstructions of the cervical spine were also generated. COMPARISON:  None. FINDINGS: CT HEAD FINDINGS Examination is technically limited due to motion artifact. Diffuse cerebral atrophy. Ventricular dilatation consistent with central atrophy. Patchy low-attenuation changes in the deep white matter consistent with small vessel ischemia. No mass effect or midline shift. No abnormal extra-axial fluid collections. Gray-white matter junctions are distinct. Basal cisterns are not effaced. No evidence of acute intracranial hemorrhage. No depressed skull fractures. Partial opacification or sclerosis in the left mastoid air cells. Right mastoid air cells are not opacified. Mucosal thickening demonstrated in the paranasal sinuses. Vascular calcifications. CT CERVICAL SPINE FINDINGS Diffuse degenerative change throughout the cervical spine with narrowed cervical interspaces and associated endplate hypertrophic changes. There is mild anterior subluxation of C3 on C4 and slight anterior subluxation of C7 on T1. This may be due to degenerative change but ligamentous injury could also have this appearance and is not excluded.  Correlation physical examination is recommended. Degenerative changes throughout the cervical facet joints. No vertebral compression deformities. No prevertebral soft tissue swelling. C1-2 articulation appears intact. No focal bone lesion or bone destruction. Consolidation or scarring demonstrated in the lung apices. IMPRESSION: No acute intracranial abnormalities. Chronic atrophy and small vessel ischemic changes. Diffuse degenerative change throughout the cervical spine. Mild anterior subluxation at C3-4 and C7-T1. These changes may be degenerative but ligamentous injury could also have this appearance and is not excluded. No acute displaced fractures identified. Electronically Signed   By: Burman Nieves M.D.   On: 08/23/2015 05:46   Mr Cervical Spine Wo Contrast  08/23/2015  CLINICAL DATA:  Larey Seat on 08/23/2015 and injured neck. Persistent pain. EXAM: MRI CERVICAL SPINE WITHOUT CONTRAST TECHNIQUE: Multiplanar, multisequence MR imaging of the cervical spine was performed. No intravenous contrast was administered. COMPARISON:  CT scan, same date. FINDINGS: Advanced degenerative cervical spondylosis with multilevel disc disease and facet disease. Moderate C1-2 degenerative changes with pannus formation and mild narrowing of the ventral CSF space in the upper cervical cord region. The vertebral  bodies demonstrate normal marrow signal. No findings to suggest an acute fracture. No abnormal increased STIR signal intensity in the posterior elements, paraspinal muscles or facets. The cervical spinal cord demonstrates normal signal intensity. No cord contusion or syrinx. C2-3:  No significant findings. C3-4: Degenerative anterior subluxation of C3 due to advanced facet disease. There is a bulging uncovered disc, osteophytic ridging and uncinate spurring contributing to flattening of the ventral thecal sac and narrowing of the ventral CSF space. There is moderate foraminal stenosis bilaterally, left slightly worse than  right. C4-5: Bulging annulus, osteophytic ridging, uncinate spurring and asymmetric right-sided facet disease. There is flattening of the ventral thecal sac and narrowing of the ventral CSF space. There is mild left foraminal stenosis and moderate to moderately severe right foraminal stenosis. C5-6: Right-sided disc osteophyte complex and right-sided facet disease contributing to moderate to moderately severe right foraminal stenosis. No spinal stenosis. Mild left foraminal encroachment. C6-7: Degenerative disc disease and right-sided facet disease. No significant spinal stenosis. There is mild bilateral foraminal stenosis. C7-T1:  No significant findings. IMPRESSION: 1. No MR findings to suggest an acute cervical spine fracture or cord contusion. No abnormal prevertebral soft tissue swelling. 2. Advanced degenerative cervical spondylosis with multilevel disc disease and facet disease as demonstrated on the CT scan. 3. Mild spinal and moderate bilateral foraminal stenosis at C3-4. 4. Mild spinal and mild left foraminal stenosis at C4-5. There is also moderately severe right foraminal stenosis. 5. Right-sided disc osteophyte complex at C5-6 with moderate to moderately severe right foraminal stenosis. 6. Mild bilateral foraminal stenosis at C6-7. Electronically Signed   By: Rudie Meyer M.D.   On: 08/23/2015 11:04   Pelvis Portable  08/24/2015  CLINICAL DATA:  81 year old male status post left hip ORIF. Initial encounter. EXAM: PORTABLE PELVIS 1-2 VIEWS COMPARISON:  Intraoperative images 20 302 hr today. FINDINGS: Portable AP view at 2348 hrs. Partially visible left femur ORIF hardware appears stable. Stable alignment about the intertrochanteric fracture. Lesser trochanter fragment mildly retracted. No other acute osseous abnormality identified. Foley catheter in place. IMPRESSION: Status post Left femur ORIF. Lesser trochanter butterfly fragment mildly retracted. Electronically Signed   By: Odessa Fleming M.D.   On:  08/24/2015 00:16   Dg Chest Portable 1 View  08/23/2015  CLINICAL DATA:  Fractured left hip.  Preoperative chest x-ray. EXAM: PORTABLE CHEST 1 VIEW COMPARISON:  09/30/2013 FINDINGS: Heart size and pulmonary vascularity are normal for technique. Slightly shallow inspiration. No focal airspace disease or consolidation in the lungs. No blunting of costophrenic angles. No pneumothorax. Tortuous aorta. Mediastinal contours appear intact. IMPRESSION: No active disease. Electronically Signed   By: Burman Nieves M.D.   On: 08/23/2015 06:49   Dg Knee Complete 4 Views Right  08/23/2015  CLINICAL DATA:  Patient fell on the left side in complains of right knee pain. EXAM: RIGHT KNEE - COMPLETE 4+ VIEW COMPARISON:  None. FINDINGS: Degenerative changes in the right knee with tricompartment narrowing and osteophytosis. Prominent chondrocalcinosis. No significant effusion. No evidence of acute fracture or subluxation. No focal bone lesion or bone destruction. Bone cortex and trabecular architecture appear intact. No radiopaque soft tissue foreign bodies. Vascular calcifications. IMPRESSION: Degenerative changes in the right knee. No acute bony abnormalities. Electronically Signed   By: Burman Nieves M.D.   On: 08/23/2015 05:59   Dg C-arm 1-60 Min-no Report  08/23/2015  CLINICAL DATA: surgery C-ARM 1-60 MINUTES Fluoroscopy was utilized by the requesting physician.  No radiographic interpretation.   Dg Hip Unilat  With Pelvis 2-3 Views Left  08/23/2015  CLINICAL DATA:  Patient fell on the left side.  Left hip pain. EXAM: DG HIP (WITH OR WITHOUT PELVIS) 2-3V LEFT COMPARISON:  None. FINDINGS: Acute comminuted posttraumatic inter trochanteric fractures of the left hip with mild varus angulation and impaction of the fracture fragments. Mild displacement of greater and lesser trochanteric fragments. Degenerative changes in the left hip. No evidence of left hip dislocation. No focal bone lesion or bone destruction.  Vascular calcifications. Pelvis appears intact. SI joints and symphysis pubis are not displaced. IMPRESSION: Acute comminuted inter trochanteric fractures in the left hip. Electronically Signed   By: Burman Nieves M.D.   On: 08/23/2015 05:58   Dg Femur Min 2 Views Left  08/23/2015  CLINICAL DATA:  81 year old male undergoing left femur ORIF. Initial encounter. EXAM: DG C-ARM 1-60 MIN-NO REPORT; LEFT FEMUR 2 VIEWS COMPARISON:  0522 hr today. FINDINGS: 3 intraoperative fluoroscopic views of the left femur demonstrate intra medullary rod placement with proximal inner interlocking dynamic hip screw. Improved alignment about the comminuted intertrochanteric fracture. IMPRESSION: ORIF left femur with no adverse features. FLUOROSCOPY TIME:  2 min 14 seconds Electronically Signed   By: Odessa Fleming M.D.   On: 08/23/2015 23:33    Microbiology: Recent Results (from the past 240 hour(s))  Culture, Urine     Status: None   Collection Time: 08/23/15  4:49 AM  Result Value Ref Range Status   Specimen Description URINE, CLEAN CATCH  Final   Special Requests NONE  Final   Culture   Final    NO GROWTH 1 DAY Performed at [facility]    Report Status 08/24/2015 FINAL  Final  Surgical pcr screen     Status: Abnormal   Collection Time: 08/23/15  3:42 PM  Result Value Ref Range Status   MRSA, PCR NEGATIVE NEGATIVE Final   Staphylococcus aureus POSITIVE (A) NEGATIVE Final    Comment:        The Xpert SA Assay (FDA approved for NASAL specimens in patients over 46 years of age), is one component of a comprehensive surveillance program.  Test performance has been validated by [facility] for patients greater than or equal to 77 year old. It is not intended to diagnose infection nor to guide or monitor treatment.      Labs: Basic Metabolic Panel:  Recent Labs Lab 08/23/15 0600 08/24/15 0455 08/25/15 0445  NA 137 139 138  K 3.7 4.3 3.9  CL 102 108 108  CO2 26 26 27   GLUCOSE 140* 146*  112*  BUN 22* 24* 23*  CREATININE 1.43* 1.42* 1.22  CALCIUM 9.3 8.0* 8.0*   Liver Function Tests:  Recent Labs Lab 08/24/15 0455  AST 35  ALT 13*  ALKPHOS 55  BILITOT 0.7  PROT 5.7*  ALBUMIN 3.0*   No results for input(s): LIPASE, AMYLASE in the last 168 hours. No results for input(s): AMMONIA in the last 168 hours. CBC:  Recent Labs Lab 08/23/15 0600 08/24/15 0455 08/25/15 0445 08/26/15 0433  WBC 15.2* 13.5* 12.0* 11.9*  NEUTROABS 11.7*  --   --   --   HGB 14.0 10.0* 8.1* 9.4*  HCT 41.1 30.0* 24.5* 28.3*  MCV 95.4 97.4 96.8 91.6  PLT 185 170 129* 143*   Cardiac Enzymes: No results for input(s): CKTOTAL, CKMB, CKMBINDEX, TROPONINI in the last 168 hours. BNP: BNP (last 3 results) No results for input(s): BNP in the last 8760 hours.  ProBNP (last 3 results)  No results for input(s): PROBNP in the last 8760 hours.  CBG: No results for input(s): GLUCAP in the last 168 hours.     SignedHartley Barefoot A  [facility] 08/26/2015, 9:09 AM

## 2015-08-26 NOTE — Care Management Important Message (Signed)
Important Message  Patient Details  Name: Joseph GaudierMax P Cobos MRN: [id number] Date of Birth: [birth year]   Medicare Important Message Given:  Yes-second notification given    Elliot CousinShavis, Markevion Lattin Ellen, RN 08/26/2015, 10:00 AM

## 2015-08-26 NOTE — Plan of Care (Signed)
Problem: Discharge Progression Outcomes Goal: Incision without S/S infection Outcome: Not Applicable Date Met:  48/88/91 dsg in place Goal: ADLs with assistance Outcome: Adequate for Discharge total assistance with ADLs

## 2015-08-28 ENCOUNTER — Emergency Department (HOSPITAL_COMMUNITY): Payer: Medicare Other

## 2015-08-28 ENCOUNTER — Encounter (HOSPITAL_COMMUNITY): Payer: Self-pay | Admitting: Emergency Medicine

## 2015-08-28 ENCOUNTER — Inpatient Hospital Stay
Admission: RE | Admit: 2015-08-28 | Discharge: 2015-11-14 | Disposition: A | Discharge status: Home / Self Care | Payer: BLUE CROSS/BLUE SHIELD | Source: Ambulatory Visit | Attending: Internal Medicine | Admitting: Internal Medicine

## 2015-08-28 ENCOUNTER — Other Ambulatory Visit: Payer: Self-pay | Admitting: *Deleted

## 2015-08-28 ENCOUNTER — Non-Acute Institutional Stay (SKILLED_NURSING_FACILITY): Payer: Medicare Other | Admitting: Internal Medicine

## 2015-08-28 ENCOUNTER — Emergency Department (HOSPITAL_COMMUNITY)
Admission: EM | Admit: 2015-08-28 | Discharge: 2015-08-28 | Disposition: A | Discharge status: Home / Self Care | Payer: Medicare Other | Attending: Emergency Medicine | Admitting: Emergency Medicine

## 2015-08-28 DIAGNOSIS — F0280 Dementia in other diseases classified elsewhere without behavioral disturbance: Secondary | ICD-10-CM | POA: Diagnosis not present

## 2015-08-28 DIAGNOSIS — H109 Unspecified conjunctivitis: Secondary | ICD-10-CM | POA: Diagnosis not present

## 2015-08-28 DIAGNOSIS — N183 Chronic kidney disease, stage 3 unspecified: Secondary | ICD-10-CM

## 2015-08-28 DIAGNOSIS — E039 Hypothyroidism, unspecified: Secondary | ICD-10-CM | POA: Diagnosis not present

## 2015-08-28 DIAGNOSIS — R55 Syncope and collapse: Secondary | ICD-10-CM

## 2015-08-28 DIAGNOSIS — N189 Chronic kidney disease, unspecified: Secondary | ICD-10-CM | POA: Diagnosis not present

## 2015-08-28 DIAGNOSIS — S72142A Displaced intertrochanteric fracture of left femur, initial encounter for closed fracture: Secondary | ICD-10-CM

## 2015-08-28 DIAGNOSIS — G5790 Unspecified mononeuropathy of unspecified lower limb: Secondary | ICD-10-CM | POA: Diagnosis not present

## 2015-08-28 DIAGNOSIS — W19XXXA Unspecified fall, initial encounter: Secondary | ICD-10-CM

## 2015-08-28 DIAGNOSIS — E860 Dehydration: Secondary | ICD-10-CM | POA: Insufficient documentation

## 2015-08-28 DIAGNOSIS — D62 Acute posthemorrhagic anemia: Secondary | ICD-10-CM | POA: Diagnosis not present

## 2015-08-28 DIAGNOSIS — R319 Hematuria, unspecified: Secondary | ICD-10-CM | POA: Insufficient documentation

## 2015-08-28 DIAGNOSIS — I2699 Other pulmonary embolism without acute cor pulmonale: Principal | ICD-10-CM

## 2015-08-28 DIAGNOSIS — G309 Alzheimer's disease, unspecified: Secondary | ICD-10-CM | POA: Diagnosis not present

## 2015-08-28 DIAGNOSIS — Z87891 Personal history of nicotine dependence: Secondary | ICD-10-CM | POA: Insufficient documentation

## 2015-08-28 DIAGNOSIS — Z79899 Other long term (current) drug therapy: Secondary | ICD-10-CM | POA: Insufficient documentation

## 2015-08-28 DIAGNOSIS — Z7982 Long term (current) use of aspirin: Secondary | ICD-10-CM | POA: Insufficient documentation

## 2015-08-28 DIAGNOSIS — R1310 Dysphagia, unspecified: Secondary | ICD-10-CM

## 2015-08-28 LAB — CBC
HCT: 31.5 % — ABNORMAL LOW (ref 39.0–52.0)
Hemoglobin: 10.5 g/dL — ABNORMAL LOW (ref 13.0–17.0)
MCH: 31.2 pg (ref 26.0–34.0)
MCHC: 33.3 g/dL (ref 30.0–36.0)
MCV: 93.5 fL (ref 78.0–100.0)
PLATELETS: 224 10*3/uL (ref 150–400)
RBC: 3.37 MIL/uL — AB (ref 4.22–5.81)
RDW: 14.9 % (ref 11.5–15.5)
WBC: 12.3 10*3/uL — AB (ref 4.0–10.5)

## 2015-08-28 LAB — URINALYSIS, ROUTINE W REFLEX MICROSCOPIC
Bilirubin Urine: NEGATIVE
GLUCOSE, UA: 100 mg/dL — AB
Ketones, ur: NEGATIVE mg/dL
Leukocytes, UA: NEGATIVE
Nitrite: NEGATIVE
SPECIFIC GRAVITY, URINE: 1.025 (ref 1.005–1.030)
Urobilinogen, UA: 0.2 mg/dL (ref 0.0–1.0)
pH: 6 (ref 5.0–8.0)

## 2015-08-28 LAB — BASIC METABOLIC PANEL
Anion gap: 6 (ref 5–15)
BUN: 25 mg/dL — AB (ref 6–20)
CALCIUM: 8.3 mg/dL — AB (ref 8.9–10.3)
CHLORIDE: 102 mmol/L (ref 101–111)
CO2: 28 mmol/L (ref 22–32)
CREATININE: 1.06 mg/dL (ref 0.61–1.24)
Glucose, Bld: 145 mg/dL — ABNORMAL HIGH (ref 65–99)
Potassium: 3.8 mmol/L (ref 3.5–5.1)
SODIUM: 136 mmol/L (ref 135–145)

## 2015-08-28 LAB — HEPATIC FUNCTION PANEL
ALBUMIN: 2.6 g/dL — AB (ref 3.5–5.0)
ALK PHOS: 82 U/L (ref 38–126)
ALT: 32 U/L (ref 17–63)
AST: 68 U/L — AB (ref 15–41)
BILIRUBIN TOTAL: 1.3 mg/dL — AB (ref 0.3–1.2)
Bilirubin, Direct: 0.3 mg/dL (ref 0.1–0.5)
Indirect Bilirubin: 1 mg/dL — ABNORMAL HIGH (ref 0.3–0.9)
Total Protein: 5.8 g/dL — ABNORMAL LOW (ref 6.5–8.1)

## 2015-08-28 LAB — I-STAT TROPONIN, ED: Troponin i, poc: 0.02 ng/mL (ref 0.00–0.08)

## 2015-08-28 LAB — CBG MONITORING, ED: GLUCOSE-CAPILLARY: 151 mg/dL — AB (ref 65–99)

## 2015-08-28 LAB — URINE MICROSCOPIC-ADD ON

## 2015-08-28 MED ORDER — SODIUM CHLORIDE 0.9 % IV BOLUS (SEPSIS)
500.0000 mL | Freq: Once | INTRAVENOUS | Status: AC
  Administered 2015-08-28: 500 mL via INTRAVENOUS

## 2015-08-28 MED ORDER — TOBRAMYCIN 0.3 % OP SOLN: 1.0000 [drp] | Freq: Four times a day (QID) | OPHTHALMIC | Status: DC

## 2015-08-28 MED ORDER — HYDROCODONE-ACETAMINOPHEN 5-325 MG PO TABS: ORAL_TABLET | ORAL | Status: DC

## 2015-08-28 NOTE — Progress Notes (Signed)
Patient ID: Joseph [name], male   DOB: [birth year], 81 y.o.   MRN: [id number]      Facility; [facility] Chief complaint; admission to SNF post [facility] from 10/12 to 08/26/2015  History; this is a patient who lives in his own home. He was found having fallen by his wife. He suffered an intertrochanteric hip fracture and underwent a left hip ORIF. He had postoperative anemia was transfused one unit that. He went on to have an MRI of the C-spine I'm not exactly sure of the issue here. There was no acute cervical spine fracture or contusion. It was significant and degenerative change with the right-sided foraminal stenosis at C4/5 C5/6 as well as advanced degenerative cervical spondylosis. The patient has chronic renal failure stage III this seems to remain stable.  BMP Latest Ref Rng 08/25/2015 08/24/2015 08/23/2015  Glucose 65 - 99 mg/dL 213(Y112(H) 865(H146(H) 846(N140(H)  BUN 6 - 20 mg/dL 62(X23(H) 52(W24(H) 41(L22(H)  Creatinine 0.61 - 1.24 mg/dL 2.441.22 0.10(U1.42(H) 7.25(D1.43(H)  BUN/Creat Ratio 10 - 22 - - -  Sodium 135 - 145 mmol/L 138 139 137  Potassium 3.5 - 5.1 mmol/L 3.9 4.3 3.7  Chloride 101 - 111 mmol/L 108 108 102  CO2 22 - 32 mmol/L 27 26 26   Calcium 8.9 - 10.3 mg/dL 8.0(L) 8.0(L) 9.3   CBC Latest Ref Rng 08/26/2015 08/25/2015 08/24/2015  WBC 4.0 - 10.5 K/uL 11.9(H) 12.0(H) 13.5(H)  Hemoglobin 13.0 - 17.0 g/dL 6.6(Y9.4(L) 8.1(L) 10.0(L)  Hematocrit 39.0 - 52.0 % 28.3(L) 24.5(L) 30.0(L)  Platelets 150 - 400 K/uL 143(L) 129(L) 170   Past Medical History  Diagnosis Date  . Alzheimer disease   . Hypothyroidism   . Hyperlipidemia   . Neuropathy of lower extremity   . Urinary incontinence due to cognitive impairment 06/08/2015  . Other symptoms involving cardiovascular system 04/23/2012  . CKD (chronic kidney disease)    Past Surgical History  Procedure Laterality Date  . Hernia repair    . Intramedullary (im) nail intertrochanteric Left 08/23/2015    Procedure: INTRAMEDULLARY (IM) NAIL INTERTROCHANTRIC;   Surgeon: Cammy CopaScott Gregory Dean, MD;  Location: [facility];  Service: Orthopedics;  Laterality: Left;   Current Outpatient Prescriptions on File Prior to Visit  Medication Sig Dispense Refill  . acetaminophen (TYLENOL) 325 MG tablet Take 2 tablets (650 mg total) by mouth every 6 (six) hours as needed for mild pain (or Fever >/= 101). 30 tablet 0  . aspirin EC 325 MG EC tablet Take 1 tablet (325 mg total) by mouth daily with breakfast. 30 tablet 0  . Cholecalciferol (VITAMIN D3) 2000 UNITS capsule Take 2,000 Units by mouth daily.    Marland Kitchen. donepezil (ARICEPT) 10 MG tablet TAKE 1 TABLET DAILY 90 tablet 0  . feeding supplement, ENSURE ENLIVE, (ENSURE ENLIVE) LIQD Take 237 mLs by mouth 3 (three) times daily between meals. 237 mL 12  . ferrous sulfate 325 (65 FE) MG tablet Take 1 tablet (325 mg total) by mouth 2 (two) times daily with a meal. 60 tablet 3  . gabapentin (NEURONTIN) 100 MG capsule Take 2 capsules (200 mg total) by mouth at bedtime. 30 capsule 0  . HYDROcodone-acetaminophen (NORCO/VICODIN) 5-325 MG tablet Take 1-2 tablets by mouth every 6 (six) hours as needed for moderate pain. 30 tablet 0  . HYDROcodone-acetaminophen (NORCO/VICODIN) 5-325 MG tablet Take 1 tablet by mouth every 6 (six) hours as needed for moderate pain. 30 tablet 0  . levothyroxine (SYNTHROID, LEVOTHROID) 75 MCG tablet Take 1  tablet (75 mcg total) by mouth daily. 90 tablet 3  . NAMENDA XR 28 MG CP24 24 hr capsule TAKE (1) CAPSULE DAILY 30 capsule 2  . polyethylene glycol (MIRALAX / GLYCOLAX) packet Take 17 g by mouth daily. 14 each 0  . senna (SENOKOT) 8.6 MG TABS tablet Take 1 tablet (8.6 mg total) by mouth at bedtime. 120 each 0   Social; I don't have much information. That he apparently lives with his wife since it is listed that she is the one who heard him fall. M fall. Beyond this is exact functional status is unclear  reports that he quit smoking about 43 years ago. His smoking use included Cigarettes and Pipe. He quit after 40  years of use. He quit smokeless tobacco use about 43 years ago. His smokeless tobacco use included Chew. He reports that he does not drink alcohol or use illicit drugs.  Fam hxindicated that his mother is deceased. He indicated that his father is deceased.    Review of systems; this is not possible from the patient secondary to the advanced state of his dementia  Physical examination Gen. thin elderly man but not in overt distress. Vitals; O2 sat is 98% on room air respirations 18 and unlabored pulse rate 81 HEENT; no oral lesions were seen Lymph none palpable in the cervical clavicular or axillary areas Respiratory; clear entry bilaterally no crackles or wheezes. I did note a loose cough Cardiac heart sounds are normal he has a grade 2/6 pansystolic murmur at the PMI radiating to the eczema left Abdomen; no liverspleen no masses no tenderness GU bladder is not distended there is no tenderness or CVA tenderness. Extremities; his incision is clean there is no evidence of infection here. Minimal swelling of the left thigh Neurologic; no cooperation with his exam is diffusely hyperreflexic absent knee and ankle jerks. Mental status; I believe he might have significant hearing loss but he cannot so me where he is where he lives his age. He looks perplexed with every question. No evidence of delirium Skin; no pressure areas over the heels or coccyx  Impression/plan #1 left hip fracture status post or I affect. He is nonweightbearing. He is only on aspirin once a day for DVT prophylaxis however given his fall risk this is probably reasonable. #2 severe Alzheimer's disease; his exact functional status is not really clear #3 hypothyroidism TSH was within the normal range in the hospital at 1.55 #4 he apparently had hematuria in the hospital. A urine culture was negative although it does not appear that this was finalized. #5 chronic renal failure stage III with a baseline creatinine of 1.3-1.5. The  etiology of this is not completely clear. #6 leukocytosis in the hospital although this be trending down. This chest x-ray from 10/12 was clear. As noted that he had hematuria although I'm not certain whether this was related to a catheter or was spontaneous. #7 peripheral neuropathy on Neurontin. His hemoglobin A1c is 5.7 this is listed as idiopathic. I am not certain how this affected his gait before the fracture #8 anemia he was transfused 1 unit and put on iron. I've been able to verify that his preoperative hemoglobin was normal   Patient is at the risk for a nonambulatory status. Also needs to be carefully monitored for pressure areas

## 2015-08-28 NOTE — Telephone Encounter (Signed)
[facility] 

## 2015-08-28 NOTE — ED Provider Notes (Signed)
CSN: [id number]     Arrival date & time 08/28/15  1201 History  By signing my name below, I, Joseph [name], attest that this documentation has been prepared under the direction and in the presence of Bethann Berkshire, MD. Electronically Signed: Ronney [name], ED Scribe. 08/28/2015. 12:41 PM.    Chief Complaint  Patient presents with  . Loss of Consciousness   Patient is a 81 y.o. male presenting with syncope. The history is provided by a relative and the EMS personnel. No language interpreter was used.  Loss of Consciousness Episode history:  Single Most recent episode:  Today Timing:  Rare Progression:  Unchanged Chronicity:  New Context: standing up   Witnessed: yes   Relieved by:  Lying down Worsened by:  Nothing tried Ineffective treatments:  None tried Associated symptoms: no chest pain, no headaches and no seizures     HPI Comments: Joseph [name] is a 81 y.o. male with a history of Alzheimer disease, hypothyroidism, HLD, and neuropathy of lower extremity brought in by ambulance to the Emergency Department for a syncopal episode that occurred PTA. Patient is from the [facility] and had stood up to do physical therapy when he lost consciousness. Per EMS, patient was laid down and became alert. Patient had a left hip replacement 3 days ago. His daughter states his orthopedic surgeon said he was supposed to be non-weight-bearing for 2 weeks, but she is concerned that he has unknowingly been bearing weight as he has Alzheimer's. She states his mental status is at baseline today. His daughter notes he has been sleeping for 16 hours a day on average for the past several months, which is normal for him.  Past Medical History  Diagnosis Date  . Alzheimer disease   . Hypothyroidism   . Hyperlipidemia   . Neuropathy of lower extremity   . Urinary incontinence due to cognitive impairment 06/08/2015  . Other symptoms involving cardiovascular system 04/23/2012  . CKD (chronic kidney disease)    Past  Surgical History  Procedure Laterality Date  . Hernia repair    . Intramedullary (im) nail intertrochanteric Left 08/23/2015    Procedure: INTRAMEDULLARY (IM) NAIL INTERTROCHANTRIC;  Surgeon: Cammy Copa, MD;  Location: [facility];  Service: Orthopedics;  Laterality: Left;   History reviewed. No pertinent family history. Social History  Substance Use Topics  . Smoking status: Former Smoker -- 40 years    Types: Cigarettes, Pipe    Quit date: 04/23/1972  . Smokeless tobacco: Former Neurosurgeon    Types: Chew    Quit date: 04/23/1972  . Alcohol Use: No    Review of Systems  Constitutional: Negative for appetite change and fatigue.  HENT: Negative for congestion, ear discharge and sinus pressure.   Eyes: Negative for discharge.  Respiratory: Negative for cough.   Cardiovascular: Positive for syncope. Negative for chest pain.  Gastrointestinal: Negative for abdominal pain and diarrhea.  Genitourinary: Negative for frequency and hematuria.  Musculoskeletal: Negative for back pain.  Skin: Negative for rash.  Neurological: Negative for seizures and headaches.  Psychiatric/Behavioral: Negative for hallucinations.   Allergies  Naproxen and Sulfa antibiotics  Home Medications   Prior to Admission medications   Medication Sig Start Date End Date Taking? Authorizing Provider  acetaminophen (TYLENOL) 325 MG tablet Take 2 tablets (650 mg total) by mouth every 6 (six) hours as needed for mild pain (or Fever >/= 101). 08/25/15   Belkys A Regalado, MD  aspirin EC 325 MG EC tablet Take 1 tablet (  325 mg total) by mouth daily with breakfast. 08/24/15   Cammy CopaScott Gregory Dean, MD  Cholecalciferol (VITAMIN D3) 2000 UNITS capsule Take 2,000 Units by mouth daily.    Historical Provider, MD  donepezil (ARICEPT) 10 MG tablet TAKE 1 TABLET DAILY 08/08/15   Ernestina Pennaonald W Moore, MD  feeding supplement, ENSURE ENLIVE, (ENSURE ENLIVE) LIQD Take 237 mLs by mouth 3 (three) times daily between meals. 08/25/15   Belkys A  Regalado, MD  ferrous sulfate 325 (65 FE) MG tablet Take 1 tablet (325 mg total) by mouth 2 (two) times daily with a meal. 08/25/15   Belkys A Regalado, MD  gabapentin (NEURONTIN) 100 MG capsule Take 2 capsules (200 mg total) by mouth at bedtime. 08/25/15   Belkys A Regalado, MD  HYDROcodone-acetaminophen (NORCO/VICODIN) 5-325 MG tablet Take 1-2 tablets by mouth every 6 (six) hours as needed for moderate pain. 08/24/15   Cammy CopaScott Gregory Dean, MD  HYDROcodone-acetaminophen (NORCO/VICODIN) 5-325 MG tablet Take 1 tablet by mouth every 6 (six) hours as needed for moderate pain. 08/25/15   Belkys A Regalado, MD  levothyroxine (SYNTHROID, LEVOTHROID) 75 MCG tablet Take 1 tablet (75 mcg total) by mouth daily. 02/10/15   Ernestina Pennaonald W Moore, MD  NAMENDA XR 28 MG CP24 24 hr capsule TAKE (1) CAPSULE DAILY 08/15/15   Ernestina Pennaonald W Moore, MD  polyethylene glycol [facility](MIRALAX / Ethelene HalGLYCOLAX) packet Take 17 g by mouth daily. 08/25/15   Belkys A Regalado, MD  senna (SENOKOT) 8.6 MG TABS tablet Take 1 tablet (8.6 mg total) by mouth at bedtime. 08/25/15   Belkys A Regalado, MD   BP 104/51 mmHg  Pulse 100  Temp(Src) 97.8 F (36.6 C) (Oral)  Resp 24  SpO2 97% Physical Exam  Constitutional: He appears well-developed.  Lethargic.   HENT:  Head: Normocephalic.  Dry mm.  Eyes: Conjunctivae and EOM are normal. No scleral icterus.  Pinpoint pupils.  Neck: Neck supple. No thyromegaly present.  Cardiovascular: Normal rate and regular rhythm.  Exam reveals no gallop and no friction rub.   No murmur heard. Pulmonary/Chest: No stridor. He has no wheezes. He has no rales. He exhibits no tenderness.  Abdominal: He exhibits no distension. There is no tenderness. There is no rebound.  Musculoskeletal: Normal range of motion. He exhibits no edema.  Lymphadenopathy:    He has no cervical adenopathy.  Neurological: He exhibits normal muscle tone. Coordination normal.  Oriented to person only.   Skin: No rash noted. No erythema.  Healing  incision to right hip.   Psychiatric: He has a normal mood and affect. His behavior is normal.  Nursing note and vitals reviewed.   ED Course  Procedures (including critical care time)  DIAGNOSTIC STUDIES: Oxygen Saturation is 97% on RA, normal by my interpretation.    COORDINATION OF CARE: 12:38 PM - Discussed treatment plan with pt's daughter at bedside which includes diagnostic testing. Pt's daughter verbalized understanding and agreed to plan.   Labs Review Labs Reviewed  CBC - Abnormal; Notable for the following:    WBC 12.3 (*)    RBC 3.37 (*)    Hemoglobin 10.5 (*)    HCT 31.5 (*)    All other components within normal limits  CBG MONITORING, ED - Abnormal; Notable for the following:    Glucose-Capillary 151 (*)    All other components within normal limits  BASIC METABOLIC PANEL    Imaging Review No results found. I have personally reviewed and evaluated these images and lab results as part of  my medical decision-making.   EKG Interpretation   Date/Time:  Monday August 28 2015 12:03:58 EDT Ventricular Rate:  98 PR Interval:  132 QRS Duration: 126 QT Interval:  393 QTC Calculation: 502 R Axis:   90 Text Interpretation:  Sinus rhythm RBBB and LPFB Confirmed by Yamira Papa  MD,  Katrese Shell [phone]) on 08/28/2015 12:46:15 PM Also confirmed by Terez Montee  MD,  Jomarie Longs [phone])  on 08/28/2015 3:34:57 PM      MDM   Final diagnoses:  None    Labs unremarkable except for mild dehydration and hematuria. Have hydrated the patient somewhat saline will culture the urine suspect syncope is related to dehydration and weakness from recent surgery. Patient will be followed up with his primary care doctor this week.j  ..Marland KitchenMarland KitchenThe chart was scribed for me under my direct supervision.  I personally performed the history, physical, and medical decision making and all procedures in the evaluation of this patient.Bethann Berkshire, MD 08/28/15 [phone]

## 2015-08-28 NOTE — ED Notes (Addendum)
Per EMS, pt had left hip replacement x3 days. Per EMS, pt came to [facility] Saturday. Pt stood up to do PT and had syncopal episode. Per [facility] staff, pt was laid down and pt became alert. Pt alert to self at baseline. nad noted. [facility] for [facility] out PE per EMS.

## 2015-08-28 NOTE — Discharge Instructions (Signed)
Drink more fluids and follow up with your md this week °

## 2015-08-28 NOTE — ED Notes (Signed)
[facility] reported would send someone to pick up patient. ED Charge RN aware.

## 2015-08-29 ENCOUNTER — Encounter: Payer: Self-pay | Admitting: Internal Medicine

## 2015-08-29 ENCOUNTER — Non-Acute Institutional Stay (SKILLED_NURSING_FACILITY): Payer: Medicare Other | Admitting: Internal Medicine

## 2015-08-29 ENCOUNTER — Encounter (HOSPITAL_COMMUNITY)
Admission: RE | Admit: 2015-08-29 | Discharge: 2015-08-29 | Disposition: A | Discharge status: Home / Self Care | Payer: Medicare Other | Source: Skilled Nursing Facility | Attending: Internal Medicine | Admitting: Internal Medicine

## 2015-08-29 ENCOUNTER — Ambulatory Visit (HOSPITAL_COMMUNITY)
Admit: 2015-08-29 | Discharge: 2015-08-29 | Disposition: A | Discharge status: Home / Self Care | Payer: Medicare Other | Source: Ambulatory Visit | Attending: Internal Medicine | Admitting: Internal Medicine

## 2015-08-29 DIAGNOSIS — F0280 Dementia in other diseases classified elsewhere without behavioral disturbance: Secondary | ICD-10-CM

## 2015-08-29 DIAGNOSIS — D62 Acute posthemorrhagic anemia: Secondary | ICD-10-CM | POA: Diagnosis not present

## 2015-08-29 DIAGNOSIS — R791 Abnormal coagulation profile: Secondary | ICD-10-CM | POA: Diagnosis not present

## 2015-08-29 DIAGNOSIS — R7989 Other specified abnormal findings of blood chemistry: Secondary | ICD-10-CM | POA: Diagnosis not present

## 2015-08-29 DIAGNOSIS — Z96649 Presence of unspecified artificial hip joint: Secondary | ICD-10-CM | POA: Insufficient documentation

## 2015-08-29 DIAGNOSIS — G309 Alzheimer's disease, unspecified: Secondary | ICD-10-CM | POA: Diagnosis not present

## 2015-08-29 DIAGNOSIS — R55 Syncope and collapse: Secondary | ICD-10-CM

## 2015-08-29 LAB — BASIC METABOLIC PANEL
Anion gap: 6 (ref 5–15)
BUN: 25 mg/dL — AB (ref 6–20)
CO2: 30 mmol/L (ref 22–32)
Calcium: 8.5 mg/dL — ABNORMAL LOW (ref 8.9–10.3)
Chloride: 101 mmol/L (ref 101–111)
Creatinine, Ser: 1.06 mg/dL (ref 0.61–1.24)
GFR calc Af Amer: >60 mL/min (ref >60)
GLUCOSE: 141 mg/dL — AB (ref 65–99)
POTASSIUM: 3.5 mmol/L (ref 3.5–5.1)
Sodium: 137 mmol/L (ref 135–145)

## 2015-08-29 LAB — CBC WITH DIFFERENTIAL/PLATELET
Basophils Absolute: 0 10*3/uL (ref 0.0–0.1)
Basophils Relative: 0 %
EOS PCT: 2 %
Eosinophils Absolute: 0.2 10*3/uL (ref 0.0–0.7)
HCT: 30.5 % — ABNORMAL LOW (ref 39.0–52.0)
Hemoglobin: 10.2 g/dL — ABNORMAL LOW (ref 13.0–17.0)
LYMPHS PCT: 17 %
Lymphs Abs: 1.6 10*3/uL (ref 0.7–4.0)
MCH: 31.7 pg (ref 26.0–34.0)
MCHC: 33.4 g/dL (ref 30.0–36.0)
MCV: 94.7 fL (ref 78.0–100.0)
MONO ABS: 1.2 10*3/uL — AB (ref 0.1–1.0)
Monocytes Relative: 12 %
Neutro Abs: 6.6 10*3/uL (ref 1.7–7.7)
Neutrophils Relative %: 69 %
PLATELETS: 250 10*3/uL (ref 150–400)
RBC: 3.22 MIL/uL — ABNORMAL LOW (ref 4.22–5.81)
RDW: 15.1 % (ref 11.5–15.5)
WBC: 9.6 10*3/uL (ref 4.0–10.5)

## 2015-08-29 LAB — URINE CULTURE
CULTURE: NO GROWTH
Culture: >=100000

## 2015-08-29 LAB — D-DIMER, QUANTITATIVE: D-Dimer, Quant: 7.07 ug/mL-FEU — ABNORMAL HIGH (ref 0.00–0.48)

## 2015-08-29 MED ORDER — IOHEXOL 350 MG/ML SOLN
100.0000 mL | Freq: Once | INTRAVENOUS | Status: AC | PRN
  Administered 2015-08-29: 100 mL via INTRAVENOUS

## 2015-08-29 NOTE — Progress Notes (Signed)
Patient ID: Judd GaudierMax P Fadden, male   DOB: 12-27-1925, 81 y.o.   MRN: [id number]     this is an acute visit   Facility; [facility]   Chief complaint; --acute visit follow-up syncopal episode with ER visit  HPI--; this is a patient who lived in his own home. He was found having fallen by his wife. He suffered an intertrochanteric hip fracture and underwent a left hip ORIF. He had postoperative anemia was transfused one unit that. He went on to have an MRI of the C-spine There was no acute cervical spine fracture or contusion. It was significant and degenerative change with the right-sided foraminal stenosis at C4/5 C5/6 as well as advanced degenerative cervical spondylosis. The patient has chronic renal failure stage III this seems to remain stable. Apparently while working with therapy yesterday patient had a syncopal episode and became unresponsive for short. He was sent to the ER where workup did not really show any acute etiology.  CT of his head was negative for any acute process urine culture did not really show significant growth-labs were fairly unremarkable-no salt syncopal episode was possibly a combination of dehydration and weakness from recent surgery.  Earlier today Dr. Leanord Hawkingobson did order a d-dimer however which came back elevated at 7.07-subsequently a CT of his chest was ordered this has just come back and is negative for pulmonary embolism-.  Dr. Leanord Hawkingobson did empirically start him on Xarelto--  Currently patient appears to be stable I do see listed previous blood pressure 87/45 however I did take it manually and got 120/60.  He is sitting in his chair comfortably is currently receiving IV fluids secondary to recently receiving contrast for his CT scan of the chest.  He has no acute complaints no cough or shortness of breath has been noted he is a poor historian secondary to dementia    BMP Latest Ref Rng 08/25/2015 08/24/2015 08/23/2015  Glucose 65 - 99 mg/dL 657(Q112(H) 469(G146(H) 295(M140(H)  BUN  6 - 20 mg/dL 84(X23(H) 32(G24(H) 40(N22(H)  Creatinine 0.61 - 1.24 mg/dL 0.271.22 2.53(G1.42(H) 6.44(I1.43(H)  BUN/Creat Ratio 10 - 22 - - -  Sodium 135 - 145 mmol/L 138 139 137  Potassium 3.5 - 5.1 mmol/L 3.9 4.3 3.7  Chloride 101 - 111 mmol/L 108 108 102  CO2 22 - 32 mmol/L 27 26 26   Calcium 8.9 - 10.3 mg/dL 8.0(L) 8.0(L) 9.3   CBC Latest Ref Rng 08/26/2015 08/25/2015 08/24/2015  WBC 4.0 - 10.5 K/uL 11.9(H) 12.0(H) 13.5(H)  Hemoglobin 13.0 - 17.0 g/dL 3.4(V9.4(L) 8.1(L) 10.0(L)  Hematocrit 39.0 - 52.0 % 28.3(L) 24.5(L) 30.0(L)  Platelets 150 - 400 K/uL 143(L) 129(L) 170   Past Medical History  Diagnosis Date  . Alzheimer disease   . Hypothyroidism   . Hyperlipidemia   . Neuropathy of lower extremity   . Urinary incontinence due to cognitive impairment 06/08/2015  . Other symptoms involving cardiovascular system 04/23/2012  . CKD (chronic kidney disease)    Past Surgical History  Procedure Laterality Date  . Hernia repair    . Intramedullary (im) nail intertrochanteric Left 08/23/2015    Procedure: INTRAMEDULLARY (IM) NAIL INTERTROCHANTRIC;  Surgeon: Cammy CopaScott Gregory Dean, MD;  Location: [facility];  Service: Orthopedics;  Laterality: Left;   Current Outpatient Prescriptions on File Prior to Visit  Medication Sig Dispense Refill  . acetaminophen (TYLENOL) 325 MG tablet Take 2 tablets (650 mg total) by mouth every 6 (six) hours as needed for mild pain (or Fever >/= 101). 30 tablet 0  .  aspirin EC 325 MG EC tablet Take 1 tablet (325 mg total) by mouth daily with breakfast. 30 tablet 0  . Cholecalciferol (VITAMIN D3) 2000 UNITS capsule Take 2,000 Units by mouth daily.    Marland Kitchen donepezil (ARICEPT) 10 MG tablet TAKE 1 TABLET DAILY 90 tablet 0  . feeding supplement, ENSURE ENLIVE, (ENSURE ENLIVE) LIQD Take 237 mLs by mouth 3 (three) times daily between meals. 237 mL 12  . ferrous sulfate 325 (65 FE) MG tablet Take 1 tablet (325 mg total) by mouth 2 (two) times daily with a meal. 60 tablet 3  . gabapentin (NEURONTIN) 100 MG  capsule Take 2 capsules (200 mg total) by mouth at bedtime. 30 capsule 0  . HYDROcodone-acetaminophen (NORCO/VICODIN) 5-325 MG tablet Take 1-2 tablets by mouth every 6 (six) hours as needed for moderate pain. 30 tablet 0  . HYDROcodone-acetaminophen (NORCO/VICODIN) 5-325 MG tablet Take 1 tablet by mouth every 6 (six) hours as needed for moderate pain. 30 tablet 0  . levothyroxine (SYNTHROID, LEVOTHROID) 75 MCG tablet Take 1 tablet (75 mcg total) by mouth daily. 90 tablet 3  . NAMENDA XR 28 MG CP24 24 hr capsule TAKE (1) CAPSULE DAILY 30 capsule 2  . polyethylene glycol (MIRALAX / GLYCOLAX) packet Take 17 g by mouth daily. 14 each 0  . senna (SENOKOT) 8.6 MG TABS tablet Take 1 tablet (8.6 mg total) by mouth at bedtime. 120 each 0  Of note Xarelto has been started today per Dr. Leanord Hawking   Social he apparently lives with his wife since it is listed that she is the one who heard him fall. M fall. Beyond this is exact functional status is unclear  reports that he quit smoking about 43 years ago. His smoking use included Cigarettes and Pipe. He quit after 40 years of use. He quit smokeless tobacco use about 43 years ago. His smokeless tobacco use included Chew. He reports that he does not drink alcohol or use illicit drugs.  Fam hxindicated that his mother is deceased. He indicated that his father is deceased.    Review of systems; this is not possible from the patient secondary to the advanced state of his dementia  Physical examination Temperature 97.9 pulse 76 respirations 18 blood pressure taken manually 120/60 Gen. thin elderly man but not in overt distress.--Sitting comfortably in his wheelchair His skin is warm and dry HEENT; oropharynx is clear visual acuity appears grossly intac Respiratory; clear entry bilaterally no crackles or wheezes. He does have shallow air entry--there is no labored breathing Cardiac heart sounds are normal he has a grade 2/6 pansystolic murmur at the PMI radiating to  the eczema left Abdomen; soft with positive bowel sounds   Extremities;  General frailty is able to move all extremities ambulates in a wheelchair quite unsteady with any attempt to stand  Neurologic; no cooperation with his exam but I do not see any overt deficits or lateralizing findings her speech is clear although he is confused. Mental status; Appears to have severe dementia although hearing difficulties may contribute this  Labs.  Updated labs today show a sodium of 137 potassium 3.5 BUN 25 creatinine 1.06.  Again d-dimer was elevated at 7.07.  WBC 9.6 hemoglobin 10.2 platelets 250.  Urine culture so far has shown no growth.  I do note on lab done yesterday his white count was mildly elevated at 12.3 this appears to have normalized on lab done today     Impression/plan  Syncope-thought possibly secondary to dehydration and  weakness from surgery-clinically he appears stable by do note a low blood pressure previously of 87/45 although I suspect this with machine reading and not sure of its true accuracy pressures sitting today was 120/60 patient is quite frail and it was difficult to do an orthostatic-eventually would like to have these done for comparison.  Elevated d-dimer again CT of the chest has been negative he does not show any sign of respiratory distress at this point will monitor--Dr. Leanord Hawking did start him on Xarelto  #3 left hip fracture status post or I affect. He is nonweightbearing.--And he has been started on Xarelto   4-- severe Alzheimer's disease; his exact functional status is not really clear--he appears essentially to be at his baseline 5 hypothyroidism TSH was within the normal range in the hospital at 1.55  #6 apparently previous history hematuria in the hospital--urine culture so far has not grown out anything this was obtained in the ER yesterday his hemoglobin has been stable in the 10 range #7 chronic renal failure stage III with a baseline  creatinine of 1.3-1.5. The etiology of this is not completely clear.--This actually appears improved with a creatinine of 1.06 today and BUN of 25 #8 leukocytosis in the hospital --this appeared to be trending down and actually is 9.6 today I do note it was 12.3 yesterday again urine culture appears to be negative respiratory exam was fairly benign today without cough or overt shortness of breath again chest CT did not show any embolism    #9 anemia he was transfused 1 unit and put on iron.--Hemoglobin appears to be stable at 10.2.  #10-conjunctivitis-it appears per report from family member who was with him during his ER visit that he was supposed to be on an eyedrop for suspected conjunctivitis-again med list it looks  likethere has been a tobramycin order will check with nursing here about the status of this--eye exam looks fairly benign today although apparently there was suspicion of this when he went to the ER yesterday  [id number]-of note greater than 35 minutes spent assessing patient-reviewing his chart and records and labs from ER visit-discussing his status with nursing staff-and reviewing labs and reports from today's studies-of note greater than 50% of time spent coordinating plan of care-patient's status also was discussed with Dr. Leanord Hawking via phone      areas

## 2015-08-30 ENCOUNTER — Non-Acute Institutional Stay (SKILLED_NURSING_FACILITY): Payer: Medicare Other | Admitting: Internal Medicine

## 2015-08-30 DIAGNOSIS — R55 Syncope and collapse: Secondary | ICD-10-CM

## 2015-09-04 ENCOUNTER — Encounter (HOSPITAL_COMMUNITY)
Admission: RE | Admit: 2015-09-04 | Discharge: 2015-09-04 | Disposition: A | Discharge status: Home / Self Care | Payer: Medicare Other | Source: Skilled Nursing Facility | Attending: Internal Medicine | Admitting: Internal Medicine

## 2015-09-04 ENCOUNTER — Non-Acute Institutional Stay (SKILLED_NURSING_FACILITY): Payer: Medicare Other | Admitting: Internal Medicine

## 2015-09-04 DIAGNOSIS — R319 Hematuria, unspecified: Secondary | ICD-10-CM

## 2015-09-04 DIAGNOSIS — R55 Syncope and collapse: Secondary | ICD-10-CM | POA: Diagnosis not present

## 2015-09-04 LAB — CBC
HCT: 32.8 % — ABNORMAL LOW (ref 39.0–52.0)
Hemoglobin: 10.6 g/dL — ABNORMAL LOW (ref 13.0–17.0)
MCH: 31.4 pg (ref 26.0–34.0)
MCHC: 32.3 g/dL (ref 30.0–36.0)
MCV: 97 fL (ref 78.0–100.0)
PLATELETS: 438 10*3/uL — AB (ref 150–400)
RBC: 3.38 MIL/uL — ABNORMAL LOW (ref 4.22–5.81)
RDW: 16.1 % — ABNORMAL HIGH (ref 11.5–15.5)
WBC: 13.3 10*3/uL — ABNORMAL HIGH (ref 4.0–10.5)

## 2015-09-04 LAB — BASIC METABOLIC PANEL
Anion gap: 6 (ref 5–15)
BUN: 36 mg/dL — AB (ref 6–20)
CALCIUM: 8.9 mg/dL (ref 8.9–10.3)
CO2: 30 mmol/L (ref 22–32)
Chloride: 100 mmol/L — ABNORMAL LOW (ref 101–111)
Creatinine, Ser: 1.38 mg/dL — ABNORMAL HIGH (ref 0.61–1.24)
GFR calc Af Amer: 51 mL/min — ABNORMAL LOW (ref >60)
GFR, EST NON AFRICAN AMERICAN: 44 mL/min — AB (ref >60)
GLUCOSE: 112 mg/dL — AB (ref 65–99)
Potassium: 3.9 mmol/L (ref 3.5–5.1)
SODIUM: 136 mmol/L (ref 135–145)

## 2015-09-04 NOTE — Progress Notes (Addendum)
Patient ID: Joseph [name], male   DOB: [birth year], 81 y.o.   MRN: [id number]                PROGRESS NOTE  DATE:  08/30/2015          FACILITY: [facility]                    LEVEL OF CARE:   SNF   Acute Visit               CHIEF COMPLAINT:  Follow up syncopal spell.      HISTORY OF PRESENT ILLNESS:  This is a patient whom I admitted to the facility on 08/28/2015.  He had suffered an intertrochanteric hip fracture and subsequently an ORIF.    He had postoperative anemia and was transfused.    Shortly after I had done my exam on him, he was working with physical therapy.  He apparently was brought up to the standing position and then became unresponsive.  He was put in the bed.  He eventually came around with a sternal rub.  I did an EKG in the facility.  This was unchanged from the hospital tracing.  I thought, given the fact that he was a recent hip surgery who was not on full anticoagulation, that we needed to rule out a pulmonary embolism.  I sent him to the ER for this reason when I could not arrange the study from the facility.    Unfortunately, no CT scan of the chest was done.   I arranged this yesterday over the phone with the facility staff.  There was no evidence of pulmonary embolism.  It was felt that he possibly had chronic fibrotic changes of both lung bases and bi-apical pleural parenchymal scarring.    As far as I am aware, there have been no further issues.     LABORATORY DATA/RADIOLOGY:  In the emergency room, his basic metabolic panel was normal with a sodium of 135, potassium 3.5, BUN of 25, creatinine 1.06.    His white count was 9.6, hemoglobin 10.2, platelet count 250.  Differential was normal.    A D-dimer was subsequently done at 7.07.    The patient had a CT scan of the head that showed no acute intracranial abnormalities, diffuse atrophy most severe in the temporal lobe.    REVIEW OF SYSTEMS:   Not possible from this patient secondary to dementia  and ?hearing loss.      PHYSICAL EXAMINATION:   VITAL SIGNS:     PULSE:  79.    RESPIRATIONS:  18.    02 SATURATIONS:  97% on room air.    CHEST/RESPIRATORY:  Clear air entry bilaterally.    CARDIOVASCULAR:   CARDIAC:  He has a 2/6 pansystolic murmur over the PMI that radiates into the axilla.  I suspect this is mitral regurgitation.   GASTROINTESTINAL:   ABDOMEN:  No masses.    LIVER/SPLEEN/KIDNEYS:  No liver, no spleen.   GENITOURINARY:   BLADDER:  Not distended.  There is no CVA tenderness.    CIRCULATION:   EDEMA/VARICOSITIES:  Extremities:  No edema.    MUSCULOSKELETAL:   EXTREMITIES:   LEFT LOWER EXTREMITY:  The swelling of his operative thigh on the left has gone down.  There is no evidence of a DVT.     ASSESSMENT/PLAN:  Syncopal episode, which sounded very worrisome to me for pulmonary embolism.  This turned out not to be the case.  I wonder if this could have been orthostatic or perhaps vasovagal.  I will observe him clinically for the moment.  In lieu of the issue, I have changed his aspirin for DVT prophylaxis to Xarelto 10 mg for DVT prophylaxis.

## 2015-09-05 ENCOUNTER — Encounter (HOSPITAL_COMMUNITY)
Admission: AD | Admit: 2015-09-05 | Discharge: 2015-09-05 | Disposition: A | Discharge status: Home / Self Care | Payer: Medicare Other | Source: Skilled Nursing Facility | Attending: Internal Medicine | Admitting: Internal Medicine

## 2015-09-05 LAB — CBC WITH DIFFERENTIAL/PLATELET
BASOS ABS: 0.1 10*3/uL (ref 0.0–0.1)
BASOS PCT: 1 %
EOS ABS: 0.2 10*3/uL (ref 0.0–0.7)
EOS PCT: 2 %
HCT: 30.4 % — ABNORMAL LOW (ref 39.0–52.0)
HEMOGLOBIN: 9.8 g/dL — AB (ref 13.0–17.0)
Lymphocytes Relative: 9 %
Lymphs Abs: 1.1 10*3/uL (ref 0.7–4.0)
MCH: 31.5 pg (ref 26.0–34.0)
MCHC: 32.2 g/dL (ref 30.0–36.0)
MCV: 97.7 fL (ref 78.0–100.0)
Monocytes Absolute: 1.3 10*3/uL — ABNORMAL HIGH (ref 0.1–1.0)
Monocytes Relative: 11 %
NEUTROS PCT: 77 %
Neutro Abs: 9.6 10*3/uL — ABNORMAL HIGH (ref 1.7–7.7)
PLATELETS: 410 10*3/uL — AB (ref 150–400)
RBC: 3.11 MIL/uL — AB (ref 4.22–5.81)
RDW: 16.1 % — ABNORMAL HIGH (ref 11.5–15.5)
WBC: 12.4 10*3/uL — AB (ref 4.0–10.5)

## 2015-09-07 DIAGNOSIS — S72142D Displaced intertrochanteric fracture of left femur, subsequent encounter for closed fracture with routine healing: Secondary | ICD-10-CM | POA: Diagnosis not present

## 2015-09-09 NOTE — Progress Notes (Signed)
Patient ID: Joseph [name], male   DOB: December 10, 1925, 81 y.o.   MRN: [id number]                PROGRESS NOTE  DATE:  09/04/2015          FACILITY: [facility]                     LEVEL OF CARE:   SNF   Acute Visit             CHIEF COMPLAINT:  Leukocytosis.      HISTORY OF PRESENT ILLNESS:  I was asked to see this man today with regards to elevated white count on his lab work, currently at 13.3.  There is no differential count here.  His last white count was 9.6 on 08/29/2015.    Shortly after his arrival here, Joseph [name] developed an exertional syncopal event/collapse.  Joseph [name] was over in the ER.  They sent him back here.  I thought for completeness that we needed to make sure that Joseph [name] had not had a pulmonary embolism.  Joseph [name] had a CT angio of the chest on 08/29/2015 which showed no evidence of embolism.  Joseph [name] did have chronic fibrotic changes in both lung bases.     Furthermore, while in hospital, Joseph [name] had a large amount of blood on urine dipstick.  His urine culture grew Pseudomonas on 08/26/2015.  A urine on 08/28/2015 was repeated and was negative.     REVIEW OF SYSTEMS:   Actually virtually impossible from the patient secondary to hearing loss and dementia.   CHEST/RESPIRATORY:  Joseph [name] has a loose cough.          PHYSICAL EXAMINATION:   VITAL SIGNS:     PULSE:  78.     RESPIRATIONS:  20.   02 SATURATIONS:  95%.    GENERAL APPEARANCE:   The patient is not in any distress.  Joseph [name] is, however, somewhat listless, not very responsive.           CHEST/RESPIRATORY:  Shallow air entry bilaterally.    CARDIOVASCULAR:   CARDIAC:  Heart sounds are normal.  JVP is not elevated.   Joseph [name] appears to be euvolemic.       GASTROINTESTINAL:   ABDOMEN:  Not distended.  Bowel sounds are positive.   Joseph [name] does have left lower quadrant tenderness.   There is no guarding or rebound.    LIVER/SPLEEN/KIDNEYS:  No liver, no spleen palpable.     GENITOURINARY:   BLADDER:  Not distended.    CIRCULATION:   EDEMA/VARICOSITIES:   Extremities:  No edema.     ASSESSMENT/PLAN:             Leukocytosis.  Joseph [name] had mild leukocytosis in the hospital, as well, although Joseph [name] came here with a white count of 9.6 and a normal differential.  Joseph [name] is up to 13.3.   The source of this is not really clear.  Joseph [name] had Pseudomonas in his urine.  His antibiotic was stopped on 09/02/2015.  It would appear that Joseph [name] received four days of this antibiotic.  I am not completely certain why it was stopped on 09/02/2015.  The Pseudomonas was indeed sensitive.    Syncopal spell.  This was worked up both here and in the ER.  The cause was not clearly determined.  Joseph [name] did not have a pulmonary embolism or pneumonia by CT angiography.      CPT CODE: 78295  ADDENDUM:   Joseph [name] has hematuria again, as noted.  I am thinking Joseph [name] will probably need a renal ultrasound if this continues.  Joseph [name] does have some left lower quadrant tenderness.

## 2015-09-12 ENCOUNTER — Inpatient Hospital Stay (HOSPITAL_COMMUNITY)
Admit: 2015-09-12 | Discharge: 2015-09-12 | Disposition: A | Discharge status: Home / Self Care | Payer: Medicare Other | Attending: Internal Medicine | Admitting: Internal Medicine

## 2015-09-12 ENCOUNTER — Ambulatory Visit (HOSPITAL_COMMUNITY): Payer: No Typology Code available for payment source | Attending: Internal Medicine | Admitting: Speech Pathology

## 2015-09-12 DIAGNOSIS — R1310 Dysphagia, unspecified: Secondary | ICD-10-CM | POA: Diagnosis not present

## 2015-09-12 NOTE — Therapy (Signed)
[facility] [address] [place], [place], [address] Phone: [phone]   Fax:  [phone]  Modified Barium Swallow  Patient Details  Name: Joseph [name] MRN: [id number] Date of Birth: Oct 12, 1926 No Data Recorded  Encounter Date: 09/12/2015    Past Medical History  Diagnosis Date  . Alzheimer disease   . Hypothyroidism   . Hyperlipidemia   . Neuropathy of lower extremity   . Urinary incontinence due to cognitive impairment 06/08/2015  . Other symptoms involving cardiovascular system 04/23/2012  . CKD (chronic kidney disease)     Past Surgical History  Procedure Laterality Date  . Hernia repair    . Intramedullary (im) nail intertrochanteric Left 08/23/2015    Procedure: INTRAMEDULLARY (IM) NAIL INTERTROCHANTRIC;  Surgeon: Cammy CopaScott Gregory Dean, MD;  Location: [facility];  Service: Orthopedics;  Laterality: Left;    There were no vitals filed for this visit.  Visit Diagnosis: Dysphagia      Subjective Assessment - 09/12/15 1449    Subjective Pt seen upright in hausted chair for MBS, radiologist present during study    Currently in Pain? No/denies             General - 09/12/15 1450    General Information   Date of Onset 08/28/15   Other Pertinent Information Pt is an 81 year old, short term rehab resident of [facility] following a fall at home resulting in an intertrochanteric hip fracture who underwent a left hip ORIF. He had postoperative anemia was transfused one unit that. Upon initial acute hospitalization 08-23-15, MRI of the spine significant for  degenerative change with the right-sided foraminal stenosis at C4/5 C5/6 as well as advanced degenerative cervical spondylosis. The patient has chronic renal failure stage III in addition to Alzheimer's disease, hypothyroidism, and lower extermity neuropathy. Pt referred for MBS to objectively evaluate swallow function as he has exhibited s/sx of dysphagia per primary  treating SLP.    Reason for Referral Objectively evaluate swallowing function   Previous Swallow Assessment None on file   Diet Prior to this Study Dysphagia 3 (soft);Honey-thick liquids   Temperature Spikes Noted No   Respiratory Status Room air   History of Recent Intubation No   Behavior/Cognition Confused;Alert;Requires cueing;Doesn't follow directions   Oral Cavity - Dentition Poor condition;Missing dentition   Oral Motor / Sensory Function Within functional limits   Self-Feeding Abilities Needs assist   Patient Positioning Upright in chair/Tumbleform   Baseline Vocal Quality Wet   Volitional Cough Weak   Volitional Swallow Unable to elicit   Pharyngeal Secretions Not observed secondary MBS          Adult Oral Care Protocol - 09/12/15 1459    Oral Assessment (Complete on admission/transfer/change in patient condition)   Does patient have any of the following "at risk" factors? Diet - patient on thickened liquids          Oral Preparation/Oral Phase - 09/12/15 1500    Oral Preparation/Oral Phase   Oral Phase Impaired   Oral - Honey   Oral - Honey Teaspoon Weak ligual manipulation;Delayed A-P transit;Oral residue;Piecemeal swallowing;Lingual pumping   Oral - Nectar   Oral - Nectar Teaspoon Weak ligual manipulation;Lingual pumping;Delayed A-P transit;Oral residue;Piecemeal swallowing   Oral - Nectar Cup Weak ligual manipulation;Lingual pumping;Delayed A-P transit;Oral residue   Oral - Thin   Oral - Thin Teaspoon Weak ligual manipulation;Delayed A-P transit;Oral residue;Piecemeal swallowing   Oral - Solids   Oral - Puree Weak ligual  manipulation;Decreased bolus cohesion;Piecemeal swallowing;Oral residue;Delayed A-P transit   Oral - Regular Imparied mastication;Weak ligual manipulation;Decreased bolus cohesion;Delayed A-P transit;Oral residue;Piecemeal swallowing   Oral - Pill Piecemeal swallowing;Weak ligual manipulation;Oral residue;Delayed A-P transit   Electrical  stimulation - Oral Phase   Was Electrical Stimulation Used No          Pharyngeal Phase - 09/12/15 1515    Pharyngeal Phase   Pharyngeal Phase Impaired   Pharyngeal - Honey   Pharyngeal- Honey Teaspoon Delayed swallow initiation;Reduced pharyngeal peristalsis;Reduced tongue base retraction;Penetration/Apiration after swallow;Reduced laryngeal elevation;Pharyngeal residue - valleculae;Pharyngeal residue - pyriform;Pharyngeal residue - posterior pharnyx;Lateral channel residue;Reduced anterior laryngeal mobility  5 material enters airway contacts TVC, is not ejected    Pharyngeal - Nectar   Pharyngeal- Nectar Teaspoon Delayed swallow initiation;Penetration/Apiration after swallow;Penetration/Aspiration during swallow;Lateral channel residue;Pharyngeal residue - valleculae;Pharyngeal residue - pyriform;Reduced tongue base retraction;Reduced laryngeal elevation  5; material enters airway, contacts TVC, is not ejected   Pharyngeal- Nectar Cup Delayed swallow initiation;Reduced laryngeal elevation;Reduced tongue base retraction;Penetration/Apiration after swallow;Pharyngeal residue - valleculae;Pharyngeal residue - pyriform;Lateral channel residue  5; material enters airway, remains above TVC is not ejected   Pharyngeal - Thin   Pharyngeal- Thin Teaspoon Delayed swallow initiation;Penetration/Aspiration before swallow;Reduced tongue base retraction;Reduced airway/laryngeal closure;Reduced laryngeal elevation;Pharyngeal residue - valleculae;Pharyngeal residue - pyriform;Lateral channel residue  7; material passes below TVC, is not ejected desptie effort   Pharyngeal - Solids   Pharyngeal- Puree Swallow initiation at vallecula;Delayed swallow initiation;Reduced anterior laryngeal mobility;Reduced pharyngeal peristalsis;Reduced laryngeal elevation;Reduced tongue base retraction;Penetration/Apiration after swallow;Lateral channel residue;Pharyngeal residue - valleculae;Pharyngeal residue -  pyriform;Pharyngeal residue - posterior pharnyx  5; material enters airway remains above TVC is not ejected    Pharyngeal- Mechanical Soft Delayed swallow initiation;Swallow initiation at vallecula;Reduced pharyngeal peristalsis;Reduced anterior laryngeal mobility;Reduced laryngeal elevation;Pharyngeal residue - valleculae;Pharyngeal residue - pyriform;Reduced tongue base retraction  Regular consistency   Pharyngeal- Pill Delayed swallow initiation;Reduced laryngeal elevation;Reduced tongue base retraction;Penetration/Aspiration during swallow;Pharyngeal residue - valleculae;Lateral channel residue;Pharyngeal residue - pyriform                    Plan - 09/12/15 1525    Clinical Impression Statement Pt presents with moderate motor sensory oropharyngeal dysphagia which is impacted by baseline advanced cognitive deficits. No prior history of dysphagia prior to most recent hospitalization however suspect premorbid undiagnosed dysphagia component due to severity of dysphagia observed this date. Pt with delay in swallow initiation and decreased coordination/control of bolus with all consistencies leading to premature spillage. Oral residuals present, which spilled to valleculae and increased pharyngeal residuals. Aspiration evidenced with thin liquids via teaspoon prior to the swallow initiation which was not effectively sensed. Thickening of liquids enhanced timing and coordination of pts swallow, however pharyngeal residuals increased greatly with increased thickening level, negatively impacting airway protection after the swallow. Noted poor base of tongue retraction, decreased laryngeal elevation, and reduced pharyngeal peristalsis resulting in residuals in valleculae, posterior pharyngeal wall, pyriform sinus, and lateral channels. These residuals penetrated to the level of the vocal cords throughout procedure frequently. Pt with reflexive consistent throat clearing which aided clearance of  laryngeal vestibule, however did not completely protect airway.  Nectar thick liquids exhibited least amount of pharyngeal residuals without compromising airway prior to the swallow initiation. Mechanical soft and puree consistencies also resulted in moderate amount of pharyngeal residuals. Pts reflexive additional swallow did not greatly reduce residual amounts. Nectar thick liqud wash down, was most effective at reducing pharyngeal residuals. Recommend dysphagia 3 (mechancial soft) diet and nectar  thick liquids. Recommend primary treating SLP to closely monitor diet tolerance as pt remains at increased risk for aspiration.    Treatment/Interventions Aspiration precaution training;SLP instruction and feedback;Patient/family education;Diet toleration management by SLP   Potential to Achieve Goals Fair   Potential Considerations Severity of impairments;Co-morbidities   Consulted and Agree with Plan of Care Patient          G-Codes - 2015/10/08 1548    Functional Limitations Swallowing   Swallow Current Status (Z6109) At least 60 percent but less than 80 percent impaired, limited or restricted   Swallow Goal Status (U0454) At least 40 percent but less than 60 percent impaired, limited or restricted   Swallow Discharge Status [phone]) At least 60 percent but less than 80 percent impaired, limited or restricted          Recommendations/Treatment - 10/08/15 1523    Swallow Evaluation Recommendations   SLP Diet Recommendations Dysphagia 3 (Mech soft);Nectar   Liquid Administration via [employer];No straw   Medication Administration Crushed with puree   Supervision Staff to assist with self feeding;Full supervision/cueing for compensatory strategies   Compensations Minimize environmental distractions;Small sips/bites;Multiple dry swallows after each bite/sip;Follow solids with liquid;Clear throat intermittently   Postural Changes Seated upright at 90 degrees          Prognosis - October 08, 2015 1524     Prognosis   Prognosis for Safe Diet Advancement Fair   Barriers to Reach Goals Cognitive deficits;Severity of deficits   Individuals Consulted   Consulted and Agree with Results and Recommendations Patient   Report Sent to  Referring physician      Problem List Patient Active Problem List   Diagnosis Date Noted  . Syncope 08/29/2015  . Elevated d-dimer 08/29/2015  . Closed left hip fracture (HCC)   . Hip fracture (HCC) 08/23/2015  . Intertrochanteric fracture of left hip (HCC) 08/23/2015  . Chronic kidney disease, stage III (moderate) 08/23/2015  . Lower extremity neuropathy 08/23/2015  . Leukocytosis 08/23/2015  . Arthropathy of cervical spine 08/23/2015  . Hematuria 08/23/2015  . Urinary incontinence due to cognitive impairment 06/08/2015  . Alzheimer's dementia without behavioral disturbance 02/01/2014  . BPH (benign prostatic hyperplasia) 02/01/2014  . Right hydrocele 02/01/2014  . Memory disturbance 04/14/2013  . Vitamin D deficiency 04/14/2013  . Elevated serum creatinine 04/14/2013  . Hyperlipemia 04/14/2013  . Hypothyroid 04/14/2013  . Fatigue 04/14/2013  . Leg weakness, bilateral 04/14/2013  . Other symptoms involving cardiovascular system 04/23/2012   Marcene Duos MA, CCC-SLP Acute Care Speech Language Pathologist    Kennieth Rad 10-08-2015, 4:12 PM  [facility] [address] [place], [place], [address] Phone: [phone]   Fax:  [phone]  Name: Joseph [name] MRN: [id number] Date of Birth: [birth year]

## 2015-09-25 ENCOUNTER — Other Ambulatory Visit (HOSPITAL_COMMUNITY): Payer: Medicare Other

## 2015-09-25 ENCOUNTER — Ambulatory Visit (HOSPITAL_COMMUNITY): Payer: Medicare Other | Admitting: Speech Pathology

## 2015-09-26 ENCOUNTER — Encounter (HOSPITAL_COMMUNITY): Payer: Medicare Other | Admitting: Speech Pathology

## 2015-09-28 DIAGNOSIS — S72142D Displaced intertrochanteric fracture of left femur, subsequent encounter for closed fracture with routine healing: Secondary | ICD-10-CM | POA: Diagnosis not present

## 2015-10-11 ENCOUNTER — Encounter (HOSPITAL_COMMUNITY)
Admission: AD | Admit: 2015-10-11 | Discharge: 2015-10-11 | Disposition: A | Discharge status: Home / Self Care | Payer: Medicare Other | Source: Skilled Nursing Facility | Attending: Internal Medicine | Admitting: Internal Medicine

## 2015-10-11 LAB — TSH: TSH: 1.243 u[IU]/mL (ref 0.350–4.500)

## 2015-10-12 ENCOUNTER — Encounter (HOSPITAL_COMMUNITY)
Admission: AD | Admit: 2015-10-12 | Discharge: 2015-10-12 | Disposition: A | Discharge status: Home / Self Care | Payer: Medicare Other | Source: Skilled Nursing Facility | Attending: Internal Medicine | Admitting: Internal Medicine

## 2015-10-12 DIAGNOSIS — R54 Age-related physical debility: Secondary | ICD-10-CM | POA: Diagnosis not present

## 2015-10-12 DIAGNOSIS — M25552 Pain in left hip: Secondary | ICD-10-CM | POA: Diagnosis not present

## 2015-10-12 DIAGNOSIS — D649 Anemia, unspecified: Secondary | ICD-10-CM | POA: Insufficient documentation

## 2015-10-12 DIAGNOSIS — R55 Syncope and collapse: Secondary | ICD-10-CM | POA: Diagnosis not present

## 2015-10-12 DIAGNOSIS — R3981 Functional urinary incontinence: Secondary | ICD-10-CM | POA: Diagnosis not present

## 2015-10-12 DIAGNOSIS — R278 Other lack of coordination: Secondary | ICD-10-CM | POA: Diagnosis not present

## 2015-10-12 DIAGNOSIS — M25551 Pain in right hip: Secondary | ICD-10-CM | POA: Diagnosis not present

## 2015-10-12 DIAGNOSIS — M4682 Other specified inflammatory spondylopathies, cervical region: Secondary | ICD-10-CM | POA: Diagnosis not present

## 2015-10-12 DIAGNOSIS — L97411 Non-pressure chronic ulcer of right heel and midfoot limited to breakdown of skin: Secondary | ICD-10-CM | POA: Diagnosis not present

## 2015-10-12 DIAGNOSIS — G309 Alzheimer's disease, unspecified: Secondary | ICD-10-CM | POA: Diagnosis not present

## 2015-10-12 DIAGNOSIS — D62 Acute posthemorrhagic anemia: Secondary | ICD-10-CM | POA: Diagnosis not present

## 2015-10-12 DIAGNOSIS — E785 Hyperlipidemia, unspecified: Secondary | ICD-10-CM | POA: Diagnosis not present

## 2015-10-12 DIAGNOSIS — R41841 Cognitive communication deficit: Secondary | ICD-10-CM | POA: Diagnosis not present

## 2015-10-12 DIAGNOSIS — G5790 Unspecified mononeuropathy of unspecified lower limb: Secondary | ICD-10-CM | POA: Diagnosis not present

## 2015-10-12 DIAGNOSIS — R262 Difficulty in walking, not elsewhere classified: Secondary | ICD-10-CM | POA: Diagnosis not present

## 2015-10-12 DIAGNOSIS — E039 Hypothyroidism, unspecified: Secondary | ICD-10-CM | POA: Insufficient documentation

## 2015-10-12 DIAGNOSIS — N183 Chronic kidney disease, stage 3 (moderate): Secondary | ICD-10-CM | POA: Diagnosis present

## 2015-10-12 DIAGNOSIS — S72002D Fracture of unspecified part of neck of left femur, subsequent encounter for closed fracture with routine healing: Secondary | ICD-10-CM | POA: Diagnosis not present

## 2015-10-12 DIAGNOSIS — N400 Enlarged prostate without lower urinary tract symptoms: Secondary | ICD-10-CM | POA: Diagnosis not present

## 2015-10-12 DIAGNOSIS — M6281 Muscle weakness (generalized): Secondary | ICD-10-CM | POA: Diagnosis not present

## 2015-10-12 DIAGNOSIS — E038 Other specified hypothyroidism: Secondary | ICD-10-CM | POA: Diagnosis not present

## 2015-10-12 DIAGNOSIS — L89623 Pressure ulcer of left heel, stage 3: Secondary | ICD-10-CM | POA: Diagnosis not present

## 2015-10-12 DIAGNOSIS — R319 Hematuria, unspecified: Secondary | ICD-10-CM | POA: Diagnosis not present

## 2015-10-12 DIAGNOSIS — S72142D Displaced intertrochanteric fracture of left femur, subsequent encounter for closed fracture with routine healing: Secondary | ICD-10-CM | POA: Diagnosis not present

## 2015-10-12 DIAGNOSIS — E559 Vitamin D deficiency, unspecified: Secondary | ICD-10-CM | POA: Diagnosis not present

## 2015-10-17 ENCOUNTER — Encounter: Payer: Self-pay | Admitting: Internal Medicine

## 2015-10-17 ENCOUNTER — Non-Acute Institutional Stay (SKILLED_NURSING_FACILITY): Payer: Medicare Other | Admitting: Internal Medicine

## 2015-10-17 DIAGNOSIS — G309 Alzheimer's disease, unspecified: Secondary | ICD-10-CM

## 2015-10-17 DIAGNOSIS — S72002D Fracture of unspecified part of neck of left femur, subsequent encounter for closed fracture with routine healing: Secondary | ICD-10-CM | POA: Diagnosis not present

## 2015-10-17 DIAGNOSIS — R55 Syncope and collapse: Secondary | ICD-10-CM

## 2015-10-17 DIAGNOSIS — F0280 Dementia in other diseases classified elsewhere without behavioral disturbance: Secondary | ICD-10-CM

## 2015-10-17 DIAGNOSIS — N183 Chronic kidney disease, stage 3 unspecified: Secondary | ICD-10-CM

## 2015-10-17 DIAGNOSIS — E038 Other specified hypothyroidism: Secondary | ICD-10-CM | POA: Diagnosis not present

## 2015-10-17 NOTE — Progress Notes (Signed)
History of left hip fracture Patient ID: Judd Gaudier, male   DOB: 1926/01/13, 81 y.o.   MRN: [id number]      this is an acute visit   Facility; [facility]   Chief complaint; --medical management of chronic medical conditions including dementia-history of syncope-hypothyroidism-history of left hip fracture--chronic renal failure  HPI--; this is a patient who lived in his own home. He was found having fallen by his wife. He suffered an intertrochanteric hip fracture and underwent a left hip ORIF. He had postoperative anemia was transfused one unit that. He went on to have an MRI of the C-spine There was no acute cervical spine fracture or contusion. It was significant and degenerative change with the right-sided foraminal stenosis at C4/5 C5/6 as well as advanced degenerative cervical spondylosis. The patient has chronic renal failure stage III this seems to remain stable.  About 6 weeks ago he did have a syncopal episode in therapy he did go to the ER for evaluation which did not show any acute process.  CT of his head was negative for any acute process urine culture did not really show significant growth-labs were fairly unremarkable-thoughtt syncopal episode was possibly a combination of dehydration and weakness from recent surgery.  Dr. Leanord Hawking did order a d-dimer however which came back elevated at 7.07-subsequently a CT of his chest that was negative for pulmonary embolism-.  Dr. Leanord Hawking did empirically start him on Xarelto--which he remains on for DVT prophylaxis  He also was noted to have an elevated white count eventually urine culture did grow Pseudomonas and he was treated for this-appears repeat culture was negative  One point it was thought to running low blood pressures but this appears to have stabilized recent readings 117/61-131/71 the lowest one that I see is 100/50 I did take it manually today and got130/80    Patient does have significant dementia as family is requesting he be  taken off the Aricept and Namenda which considering the stage of his dementia appears to be reasonable will titrate these off  Labs.  10/11/2015.  [id number].  10- 25th 2016.  WBC 12.4 hemoglobin 9.8 platelets 410.  09/04/2015.  Sodium 136 potassium 3.9 BUN 36 creatinine 1.38    BMP Latest Ref Rng 08/25/2015 08/24/2015 08/23/2015  Glucose 65 - 99 mg/dL 147(W) 295(A) 213(Y)  BUN 6 - 20 mg/dL 86(V) 78(I) 69(G)  Creatinine 0.61 - 1.24 mg/dL 2.95 2.84(X) 3.24(M)  BUN/Creat Ratio 10 - 22 - - -  Sodium 135 - 145 mmol/L 138 139 137  Potassium 3.5 - 5.1 mmol/L 3.9 4.3 3.7  Chloride 101 - 111 mmol/L 108 108 102  CO2 22 - 32 mmol/L Calcium 8.9 - 10.3 mg/dL 8.0(L) 8.0(L) 9.3   CBC Latest Ref Rng 08/26/2015 08/25/2015 08/24/2015  WBC 4.0 - 10.5 K/uL 11.9(H) 12.0(H) 13.5(H)  Hemoglobin 13.0 - 17.0 g/dL 0.1(U) 8.1(L) 10.0(L)  Hematocrit 39.0 - 52.0 % 28.3(L) 24.5(L) 30.0(L)  Platelets 150 - 400 K/uL 143(L) 129(L) 170   Past Medical History  Diagnosis Date  . Alzheimer disease   . Hypothyroidism   . Hyperlipidemia   . Neuropathy of lower extremity   . Urinary incontinence due to cognitive impairment 06/08/2015  . Other symptoms involving cardiovascular system 04/23/2012  . CKD (chronic kidney disease)    Past Surgical History  Procedure Laterality Date  . Hernia repair    . Intramedullary (im) nail intertrochanteric Left 08/23/2015    Procedure: INTRAMEDULLARY (IM) NAIL INTERTROCHANTRIC;  Surgeon: Cammy Copa, MD;  Location: [facility];  Service: Orthopedics;  Laterality: Left;   Current Outpatient Prescriptions on File Prior to Visit  Medication Sig Dispense Refill  . acetaminophen (TYLENOL) 325 MG tablet Take 2 tablets (650 mg total) by mouth every 6 (six) hours as needed for mild pain (or Fever >/= 101). 30 tablet 0  .  Xarelto 10 mg daily    30 tablet 0  . Cholecalciferol (VITAMIN D3) 2000 UNITS capsule Take 2,000 Units by mouth daily.    Marland Kitchen donepezil (ARICEPT)  10 MG tablet TAKE 1 TABLET DAILY 90 tablet 0  . feeding supplement, ENSURE ENLIVE, (ENSURE ENLIVE) LIQD Take 237 mLs by mouth 3 (three) times daily between meals. 237 mL 12  . ferrous sulfate 325 (65 FE) MG tablet Take 1 tablet (325 mg total) by mouth 2 (two) times daily with a meal. 60 tablet 3  . gabapentin (NEURONTIN) 100 MG capsule Take 2 capsules (200 mg total) by mouth at bedtime. 30 capsule 0  . HYDROcodone-acetaminophen (NORCO/VICODIN) 5-325 MG tablet Take 1-2 tablets by mouth every 6 (six) hours as needed for moderate pain. 30 tablet 0  . HYDROcodone-acetaminophen (NORCO/VICODIN) 5-325 MG tablet Take 1 tablet by mouth every 6 (six) hours as needed for moderate pain. 30 tablet 0  . levothyroxine (SYNTHROID, LEVOTHROID) 75 MCG tablet Take 1 tablet (75 mcg total) by mouth daily. 90 tablet 3  . NAMENDA XR 28 MG CP24 24 hr capsule TAKE (1) CAPSULE DAILY 30 capsule 2  . polyethylene glycol (MIRALAX / GLYCOLAX) packet Take 17 g by mouth daily. 14 each 0  . senna (SENOKOT) 8.6 MG TABS tablet Take 1 tablet (8.6 mg total) by mouth at bedtime. 120 each 0     Social he apparently lived with his wife since it is listed that she is the one who heard him fall. l. Beyond this is exact functional status is unclear  reports that he quit smoking about 43 years ago. His smoking use included Cigarettes and Pipe. He quit after 40 years of use. He quit smokeless tobacco use about 43 years ago. His smokeless tobacco use included Chew. He reports that he does not drink alcohol or use illicit drugs.  Family hx--- his mother is deceased. his father is deceased.    Review of systems; this is not possible from the patient secondary to the advanced state of his dementia  Physical examination He is afebrile pulse 80 respirations 18 blood pressure 130/80 weight 125.8  Gen. thin elderly man but not in overt distress.--Sitting comfortably in his wheelchair His skin is warm and dry HEENT; oropharynx is clear visual  acuity appears grossly intact He has prescription lenses Respiratory; clear entry bilaterally no crackles or wheezes. He does have shallow air entry--there is no labored breathing Cardiac heart sounds are normal he has a grade 2/6 pansystolic murmur --no significant lower extremity edema Abdomen; soft with positive bowel sounds   Extremities;  General frailty is able to move all extremities ambulates in a wheelchair   Neurologic; no cooperation with his exam but I do not see any overt deficits or lateralizing findings his speech is clear although he is confused and doesn't speak much-pappears to respond to his name. Mental status; Appears to have severe dementia although hearing difficulties may contribute this  L .        Impression/plan  Syncope-thought possibly secondary to dehydration and weakness from surgery- CT chest was negative for pulmonary embolism-he is on Xarelto  for  DVT prophylaxis will discuss with Dr. Leanord Hawkingobson duration of therapy for this--whether this should continue after discharge from facility.-- Syncope has not reoccured  Elevated d-dimer again CT of the chest has been negative he does not show any sign of respiratory distress at this point will monitor-- on Xareltoempiraccly  #3 left hip fracture status --he is largely nonweightbearing.--And he has been started on Xarelto   4-- severe Alzheimer's disease; his exact functional status is not really clear--he appears essentially to be at his baseline--family has requested be off Aricept and Namenda and will titrate these down  5 hypothyroidism TSH was within the normal range in the hospital at 1.55  #6 apparently previous history hematuria Apparently this has resolved he's been treated for Pseudomonas  #7 chronic renal failure stage III with a baseline creatinine of 1.3-1.5--most recently 1.38.  This appears to remain at baseline Will update a BMP  #8 leukocytosis Per chart review appears this has some  variability ranging from normal levels to slightly elevated 12-13 range will update CBC with differential    #9 anemia he was transfused 1 unit and put on iron. Most recent hemoglobin 9.8 on 09/05/2015 will update this he is on iron.    [id number]-of note greater than 35 minutes spent assessing patient-reviewing his chart t-discussing his status with nursing staff- And coordinating and fulminating a plan of care for numerous diagnoses-of note greater than 50% of time spent coordinating plan of care

## 2015-10-18 ENCOUNTER — Ambulatory Visit (INDEPENDENT_AMBULATORY_CARE_PROVIDER_SITE_OTHER): Payer: Medicare Other | Admitting: Family Medicine

## 2015-10-18 ENCOUNTER — Encounter (HOSPITAL_COMMUNITY)
Admission: AD | Admit: 2015-10-18 | Discharge: 2015-10-18 | Disposition: A | Discharge status: Home / Self Care | Payer: Medicare Other | Source: Skilled Nursing Facility | Attending: Internal Medicine | Admitting: Internal Medicine

## 2015-10-18 ENCOUNTER — Encounter: Payer: Self-pay | Admitting: Family Medicine

## 2015-10-18 VITALS — BP 117/76 | HR 97 | Temp 97.1°F | Ht 71.0 in | Wt 128.0 lb

## 2015-10-18 DIAGNOSIS — E559 Vitamin D deficiency, unspecified: Secondary | ICD-10-CM | POA: Diagnosis not present

## 2015-10-18 DIAGNOSIS — N400 Enlarged prostate without lower urinary tract symptoms: Secondary | ICD-10-CM

## 2015-10-18 DIAGNOSIS — L97411 Non-pressure chronic ulcer of right heel and midfoot limited to breakdown of skin: Secondary | ICD-10-CM | POA: Diagnosis not present

## 2015-10-18 DIAGNOSIS — F0280 Dementia in other diseases classified elsewhere without behavioral disturbance: Secondary | ICD-10-CM

## 2015-10-18 DIAGNOSIS — R3981 Functional urinary incontinence: Secondary | ICD-10-CM

## 2015-10-18 DIAGNOSIS — S72142D Displaced intertrochanteric fracture of left femur, subsequent encounter for closed fracture with routine healing: Secondary | ICD-10-CM | POA: Diagnosis not present

## 2015-10-18 DIAGNOSIS — N183 Chronic kidney disease, stage 3 unspecified: Secondary | ICD-10-CM

## 2015-10-18 DIAGNOSIS — G309 Alzheimer's disease, unspecified: Secondary | ICD-10-CM

## 2015-10-18 DIAGNOSIS — E039 Hypothyroidism, unspecified: Secondary | ICD-10-CM

## 2015-10-18 DIAGNOSIS — E785 Hyperlipidemia, unspecified: Secondary | ICD-10-CM

## 2015-10-18 LAB — CBC WITH DIFFERENTIAL/PLATELET
BASOS ABS: 0.1 10*3/uL (ref 0.0–0.1)
BASOS PCT: 1 %
Eosinophils Absolute: 0.3 10*3/uL (ref 0.0–0.7)
Eosinophils Relative: 3 %
HEMATOCRIT: 42.6 % (ref 39.0–52.0)
HEMOGLOBIN: 13.9 g/dL (ref 13.0–17.0)
LYMPHS PCT: 22 %
Lymphs Abs: 1.9 10*3/uL (ref 0.7–4.0)
MCH: 32 pg (ref 26.0–34.0)
MCHC: 32.6 g/dL (ref 30.0–36.0)
MCV: 97.9 fL (ref 78.0–100.0)
Monocytes Absolute: 1.2 10*3/uL — ABNORMAL HIGH (ref 0.1–1.0)
Monocytes Relative: 14 %
NEUTROS ABS: 5.3 10*3/uL (ref 1.7–7.7)
NEUTROS PCT: 60 %
Platelets: 266 10*3/uL (ref 150–400)
RBC: 4.35 MIL/uL (ref 4.22–5.81)
RDW: 14.5 % (ref 11.5–15.5)
WBC: 8.7 10*3/uL (ref 4.0–10.5)

## 2015-10-18 LAB — COMPREHENSIVE METABOLIC PANEL
ALBUMIN: 3.3 g/dL — AB (ref 3.5–5.0)
ALK PHOS: 162 U/L — AB (ref 38–126)
ALT: 16 U/L — ABNORMAL LOW (ref 17–63)
ANION GAP: 6 (ref 5–15)
AST: 21 U/L (ref 15–41)
BILIRUBIN TOTAL: 0.7 mg/dL (ref 0.3–1.2)
BUN: 23 mg/dL — ABNORMAL HIGH (ref 6–20)
CALCIUM: 9.3 mg/dL (ref 8.9–10.3)
CO2: 30 mmol/L (ref 22–32)
Chloride: 106 mmol/L (ref 101–111)
Creatinine, Ser: 1 mg/dL (ref 0.61–1.24)
Glucose, Bld: 105 mg/dL — ABNORMAL HIGH (ref 65–99)
POTASSIUM: 3.7 mmol/L (ref 3.5–5.1)
Sodium: 142 mmol/L (ref 135–145)
TOTAL PROTEIN: 7 g/dL (ref 6.5–8.1)

## 2015-10-18 NOTE — Progress Notes (Signed)
Subjective:    Patient ID: Joseph [name], male    DOB: 1926-04-25, 81 y.o.   MRN: [id number]  HPI Pt here for follow up and management of chronic medical problems which includes hypothyroid and hyperlipidemia. He is accompanied  Today by his daughter and wife. He is currently housed in [facility]. The patient is currently at the nursing center and they bring him here for evaluation today. He has had a closed hip fracture on the left and had this was an intertrochanteric trochanteric fracture. He has a history of chronic kidney disease and most importantly he has Alzheimer's dementia. The fracture occurred when he fell in the bathroom. He was taken to the hospital and the fracture was repaired and he has since been at the [facility] in [place]. He is beginning to bear weight only with assistance. He has a pressure ulcer on the right heel which appears to be healing but not completely healed. He is having trouble swallowing. He is currently getting thickened foods. He does not recall my name and his Alzheimer's and dementia seemed to be worsening. He has several more days and weeks that he can stay at the [facility] and the family may have to have him come home after this. They understand that the needs the patient will have full be quite significant.      Patient Active Problem List   Diagnosis Date Noted  . Syncope 08/29/2015  . Elevated d-dimer 08/29/2015  . Closed left hip fracture ([place])   . Hip fracture ([place]) 08/23/2015  . Intertrochanteric fracture of left hip ([place]) 08/23/2015  . Chronic kidney disease, stage III (moderate) 08/23/2015  . Lower extremity neuropathy 08/23/2015  . Leukocytosis 08/23/2015  . Arthropathy of cervical spine 08/23/2015  . Hematuria 08/23/2015  . Urinary incontinence due to cognitive impairment 06/08/2015  . Alzheimer's dementia without behavioral disturbance 02/01/2014  . BPH (benign prostatic hyperplasia) 02/01/2014  . Right hydrocele  02/01/2014  . Memory disturbance 04/14/2013  . Vitamin D deficiency 04/14/2013  . Elevated serum creatinine 04/14/2013  . Hyperlipemia 04/14/2013  . Hypothyroid 04/14/2013  . Fatigue 04/14/2013  . Leg weakness, bilateral 04/14/2013  . Other symptoms involving cardiovascular system 04/23/2012   Outpatient Encounter Prescriptions as of 10/18/2015  Medication Sig  . acetaminophen (TYLENOL) 325 MG tablet Take 2 tablets (650 mg total) by mouth every 6 (six) hours as needed for mild pain (or Fever >/= 101).  . Cholecalciferol (VITAMIN D3) 2000 UNITS capsule Take 2,000 Units by mouth daily.  Marland Kitchen donepezil (ARICEPT) 10 MG tablet TAKE 1 TABLET DAILY  . feeding supplement, ENSURE ENLIVE, (ENSURE ENLIVE) LIQD Take 237 mLs by mouth 3 (three) times daily between meals.  . ferrous sulfate 325 (65 FE) MG tablet Take 1 tablet (325 mg total) by mouth 2 (two) times daily with a meal.  . gabapentin (NEURONTIN) 100 MG capsule Take 2 capsules (200 mg total) by mouth at bedtime.  Marland Kitchen HYDROcodone-acetaminophen (NORCO/VICODIN) 5-325 MG tablet Take 1 tablet by mouth every 6 (six) hours as needed for moderate pain.  Marland Kitchen levothyroxine (SYNTHROID, LEVOTHROID) 75 MCG tablet Take 1 tablet (75 mcg total) by mouth daily.  Marland Kitchen NAMENDA XR 28 MG CP24 24 hr capsule TAKE (1) CAPSULE DAILY  . polyethylene glycol (MIRALAX / GLYCOLAX) packet Take 17 g by mouth daily.  . rivaroxaban (XARELTO) 10 MG TABS tablet Take 10 mg by mouth daily.  Marland Kitchen senna (SENOKOT) 8.6 MG TABS tablet Take 1 tablet (8.6 mg total)  by mouth at bedtime.   No facility-administered encounter medications on file as of 10/18/2015.      Review of Systems  Constitutional: Negative.        Sleeping a lot  HENT: Negative.   Eyes: Negative.   Respiratory: Negative.   Cardiovascular: Negative.   Gastrointestinal: Negative.   Endocrine: Negative.   Genitourinary: Negative.   Musculoskeletal: Negative.   Skin: Positive for wound (left heel).  Allergic/Immunologic:  Negative.   Neurological: Negative.   Hematological: Negative.   Psychiatric/Behavioral: Negative.        Objective:   Physical Exam  Constitutional: He is oriented to person, place, and time. No distress.  Elderly and thin white male in no acute distress and not alert and not being able to be responsive to anything asked of him other than deep breathing.  HENT:  Head: Normocephalic and atraumatic.  Right Ear: External ear normal.  Left Ear: External ear normal.  Nose: Nose normal.  Mouth/Throat: Oropharynx is clear and moist. No oropharyngeal exudate.  Eyes: Conjunctivae and EOM are normal. Pupils are equal, round, and reactive to light. Right eye exhibits no discharge. Left eye exhibits no discharge. No scleral icterus.  Neck: Normal range of motion. Neck supple. No thyromegaly present.  Cardiovascular: Normal rate, regular rhythm and normal heart sounds.   No murmur heard. Pulmonary/Chest: Effort normal and breath sounds normal. No respiratory distress. He has no wheezes. He has no rales. He exhibits no tenderness.  Abdominal: Soft. Bowel sounds are normal. He exhibits no mass. There is no tenderness. There is no rebound and no guarding.  Musculoskeletal: He exhibits no edema or tenderness.  The patient was in a wheelchair because his walking is limited due to the recent left hip fracture repair  Lymphadenopathy:    He has no cervical adenopathy.  Neurological: He is oriented to person, place, and time. He has normal reflexes. No cranial nerve deficit.  The patient's dementia has worsened. He does have some communication but minimally.  Skin: Skin is warm and dry. No rash noted. No erythema. No pallor.  Psychiatric: He has a normal mood and affect. His behavior is normal.  He appears somewhat withdrawn and does not respond appropriately to questions asked of him  Nursing note and vitals reviewed.   BP 117/76 mmHg  Pulse 97  Temp(Src) 97.1 F (36.2 C) (Oral)  Ht 5' 11"  (1.803  m)  Wt 128 lb (58.06 kg)  BMI 17.86 kg/m2       Assessment & Plan:  1. BPH (benign prostatic hyperplasia) -This was not examined today and there is no problems that the family is aware of. - CBC with Differential/Platelet  2. Hyperlipemia -Patient will continue with healthy diet and with thickened liquids. - BMP8+EGFR - CBC with Differential/Platelet - Hepatic function panel - Lipid panel  3. Hypothyroidism, unspecified hypothyroidism type -Any change in treatment will be the result of lab work. For the time being he should continue with current treatment - CBC with Differential/Platelet  4. Vitamin D deficiency -Continue current treatment pending results of lab work - CBC with Differential/Platelet  5. Urinary incontinence due to cognitive impairment - CBC with Differential/Platelet  6. Alzheimer's dementia without behavioral disturbance -Continue with Aricept and Namenda  7. Ulcer of right heel, limited to breakdown of skin ([place]) -Have the wound team to visit and make recommendations regarding the heel ulcer  8. Chronic kidney disease, stage III (moderate) -No change in treatment pending results of lab work  9. Intertrochanteric fracture of left hip, closed, with routine healing, subsequent encounter -Continue follow-up with orthopedic surgeon and with physical therapy  Patient Instructions                       Medicare Annual Wellness Visit  [name] and the medical providers at [facility] strive to bring you the best medical care.  In doing so we not only want to address your current medical conditions and concerns but also to detect new conditions early and prevent illness, disease and health-related problems.    Medicare offers a yearly Wellness Visit which allows our clinical staff to assess your need for preventative services including immunizations, lifestyle education, counseling to decrease risk of preventable diseases and  screening for fall risk and other medical concerns.    This visit is provided free of charge (no copay) for all Medicare recipients. The clinical pharmacists at [facility] have begun to conduct these Wellness Visits which will also include a thorough review of all your medications.    As you primary medical provider recommend that you make an appointment for your Annual Wellness Visit if you have not done so already this year.  You may set up this appointment before you leave today or you may call back [phone]) and schedule an appointment.  Please make sure when you call that you mention that you are scheduling your Annual Wellness Visit with the clinical pharmacist so that the appointment may be made for the proper length of time.      Continue current medications. Continue good therapeutic lifestyle changes which include good diet and exercise. Fall precautions discussed with patient. If an FOBT was given today- please return it to our front desk. If you are over 44 years old - you may need Prevnar 42 or the adult Pneumonia vaccine.  **Flu shots are available--- please call and schedule a FLU-CLINIC appointment**  After your visit with [place] today you will receive a survey in the mail or online from [employer] regarding your care with [place]. Please take a moment to fill this out. Your feedback is very important to [place] as you can help [place] better understand your patient needs as well as improve your experience and satisfaction. WE CARE ABOUT YOU!!!   The patient should continue with physical therapy We will have the wound center look at his heel and make recommendations for helping this to heal properly He should continue with his memory medications He should continue to thicken his foods so as not to aspirate as recommended by the people that are taking care of him    Arrie Senate MD

## 2015-10-18 NOTE — Patient Instructions (Addendum)
Medicare Annual Wellness Visit  [place] and the medical providers at [facility] strive to bring you the best medical care.  In doing so we not only want to address your current medical conditions and concerns but also to detect new conditions early and prevent illness, disease and health-related problems.    Medicare offers a yearly Wellness Visit which allows our clinical staff to assess your need for preventative services including immunizations, lifestyle education, counseling to decrease risk of preventable diseases and screening for fall risk and other medical concerns.    This visit is provided free of charge (no copay) for all Medicare recipients. The clinical pharmacists at [facility] have begun to conduct these Wellness Visits which will also include a thorough review of all your medications.    As you primary medical provider recommend that you make an appointment for your Annual Wellness Visit if you have not done so already this year.  You may set up this appointment before you leave today or you may call back [phone] and schedule an appointment.  Please make sure when you call that you mention that you are scheduling your Annual Wellness Visit with the clinical pharmacist so that the appointment may be made for the proper length of time.      Continue current medications. Continue good therapeutic lifestyle changes which include good diet and exercise. Fall precautions discussed with patient. If an FOBT was given today- please return it to our front desk. If you are over 650 years old - you may need Prevnar 13 or the adult Pneumonia vaccine.  **Flu shots are available--- please call and schedule a FLU-CLINIC appointment**  After your visit with us today you will receive a survey in the mail or online from [employer] regarding your care with us. Please take a moment to fill this out. Your feedback is very  important to us as you can help us better understand your patient needs as well as improve your experience and satisfaction. WE CARE ABOUT YOU!!!   The patient should continue with physical therapy We will have the wound center look at his heel and make recommendations for helping this to heal properly He should continue with his memory medications He should continue to thicken his foods so as not to aspirate as recommended by the people that are taking care of him

## 2015-10-19 LAB — CBC WITH DIFFERENTIAL/PLATELET
BASOS: 1 %
Basophils Absolute: 0.1 10*3/uL (ref 0.0–0.2)
EOS (ABSOLUTE): 0.2 10*3/uL (ref 0.0–0.4)
EOS: 2 %
HEMATOCRIT: 44.1 % (ref 37.5–51.0)
Hemoglobin: 14.4 g/dL (ref 12.6–17.7)
IMMATURE GRANS (ABS): 0 10*3/uL (ref 0.0–0.1)
IMMATURE GRANULOCYTES: 0 %
LYMPHS: 22 %
Lymphocytes Absolute: 2.1 10*3/uL (ref 0.7–3.1)
MCH: 31 pg (ref 26.6–33.0)
MCHC: 32.7 g/dL (ref 31.5–35.7)
MCV: 95 fL (ref 79–97)
MONOS ABS: 1.4 10*3/uL — AB (ref 0.1–0.9)
Monocytes: 14 %
NEUTROS ABS: 5.7 10*3/uL (ref 1.4–7.0)
NEUTROS PCT: 61 %
PLATELETS: 292 10*3/uL (ref 150–379)
RBC: 4.64 x10E6/uL (ref 4.14–5.80)
RDW: 14.1 % (ref 12.3–15.4)
WBC: 9.5 10*3/uL (ref 3.4–10.8)

## 2015-10-19 LAB — BMP8+EGFR
BUN / CREAT RATIO: 22 (ref 10–22)
BUN: 23 mg/dL (ref 8–27)
CALCIUM: 10.1 mg/dL (ref 8.6–10.2)
CHLORIDE: 100 mmol/L (ref 97–106)
CO2: 28 mmol/L (ref 18–29)
Creatinine, Ser: 1.04 mg/dL (ref 0.76–1.27)
GFR calc Af Amer: 74 mL/min/{1.73_m2} (ref >59)
GFR calc non Af Amer: 64 mL/min/{1.73_m2} (ref >59)
GLUCOSE: 96 mg/dL (ref 65–99)
Potassium: 4.7 mmol/L (ref 3.5–5.2)
Sodium: 144 mmol/L (ref 136–144)

## 2015-10-19 LAB — LIPID PANEL
CHOLESTEROL TOTAL: 191 mg/dL (ref 100–199)
Chol/HDL Ratio: 3.9 ratio units (ref 0.0–5.0)
HDL: 49 mg/dL (ref >39)
LDL Calculated: 111 mg/dL — ABNORMAL HIGH (ref 0–99)
Triglycerides: 156 mg/dL — ABNORMAL HIGH (ref 0–149)
VLDL CHOLESTEROL CAL: 31 mg/dL (ref 5–40)

## 2015-10-19 LAB — HEPATIC FUNCTION PANEL
ALT: 13 IU/L (ref 0–44)
AST: 25 IU/L (ref 0–40)
Albumin: 3.7 g/dL (ref 3.5–4.7)
Alkaline Phosphatase: 187 IU/L — ABNORMAL HIGH (ref 39–117)
BILIRUBIN TOTAL: 0.3 mg/dL (ref 0.0–1.2)
BILIRUBIN, DIRECT: 0.15 mg/dL (ref 0.00–0.40)
Total Protein: 7.3 g/dL (ref 6.0–8.5)

## 2015-10-23 ENCOUNTER — Inpatient Hospital Stay (HOSPITAL_COMMUNITY)
Admit: 2015-10-23 | Discharge: 2015-10-23 | Disposition: A | Discharge status: Home / Self Care | Payer: Medicare Other | Attending: Internal Medicine | Admitting: Internal Medicine

## 2015-10-23 ENCOUNTER — Telehealth: Payer: Self-pay | Admitting: Family Medicine

## 2015-10-23 DIAGNOSIS — M25552 Pain in left hip: Secondary | ICD-10-CM | POA: Diagnosis not present

## 2015-10-23 DIAGNOSIS — M25551 Pain in right hip: Secondary | ICD-10-CM | POA: Diagnosis not present

## 2015-10-23 NOTE — Telephone Encounter (Signed)
Daughter plans to discuss patient's care with the provider at nursing center. It was explained that if they are in Epic, all notes can be  accessed.

## 2015-10-26 ENCOUNTER — Telehealth: Payer: Self-pay | Admitting: Family Medicine

## 2015-10-27 ENCOUNTER — Encounter: Payer: Self-pay | Admitting: Internal Medicine

## 2015-10-27 ENCOUNTER — Non-Acute Institutional Stay (SKILLED_NURSING_FACILITY): Payer: Medicare Other | Admitting: Internal Medicine

## 2015-10-27 DIAGNOSIS — G309 Alzheimer's disease, unspecified: Secondary | ICD-10-CM | POA: Diagnosis not present

## 2015-10-27 DIAGNOSIS — N183 Chronic kidney disease, stage 3 unspecified: Secondary | ICD-10-CM

## 2015-10-27 DIAGNOSIS — D62 Acute posthemorrhagic anemia: Secondary | ICD-10-CM

## 2015-10-27 DIAGNOSIS — S72002D Fracture of unspecified part of neck of left femur, subsequent encounter for closed fracture with routine healing: Secondary | ICD-10-CM

## 2015-10-27 DIAGNOSIS — F0280 Dementia in other diseases classified elsewhere without behavioral disturbance: Secondary | ICD-10-CM

## 2015-10-27 NOTE — Progress Notes (Signed)
Patient ID: Joseph [name], male   DOB: 11-15-1925, 81 y.o.   MRN: [id number]       this is a discharge note   Facility; [facility]   Chief complaint; -Discharge note-medical management of chronic medical conditions including dementia-history of syncope-hypothyroidism-history of left hip fracture--chronic renal failure  HPI--; this is a patient who lived in his own home. He was found having fallen by his wife. He suffered an intertrochanteric hip fracture and underwent a left hip ORIF. He had postoperative anemia was transfused one unit that. He went on to have an MRI of the C-spine There was no acute cervical spine fracture or contusion. It was significant and degenerative change with the right-sided foraminal stenosis at C4/5 C5/6 as well as advanced degenerative cervical spondylosis. The patient has chronic renal failure stage III this seems to remain stable with most recent creatinine 1.04 BUN 23 on December 7.  About 6 weeks ago he did have a syncopal episode in therapy he did go to the ER for evaluation which did not show any acute process.  CT of his head was negative for any acute process urine culture did not really show significant growth-labs were fairly unremarkable-thoughtt syncopal episode was possibly a combination of dehydration and weakness from recent surgery.  Dr. Leanord Hawkingobson did order a d-dimer however which came back elevated at 7.07-subsequently a CT of his chest that was negative for pulmonary embolism-.  Dr. Leanord Hawkingobson did empirically start him on Xarelto--which he remains on for DVT prophylaxis  He also was noted to have an elevated white count eventually urine culture did grow Pseudomonas and he was treated for this-appears repeat culture was negative- most recent white count was normal at 9.5 on December 7  One point it was thought to running low blood pressures but this appears to have stabilized recent readings  114/54-124/58    Patient does have significant dementia and  family is requesting be taken off Aricept and Namenda which I feel this reasonable and these have been discontinued.  Patient continues to have somewhat spotty appetite appears she's lost a small amount await about 3 pounds over the past month-.  I believe he will be going to an assisted type living situation-that is my understanding currently although I  a possibility he may be going home with family which is supportive   Labs  12/ 7 2016.  Sodium 144 potassium 4.7 BUN 23 creatinine 1.04.  WBC 9.5 hemoglobin 14.4 platelets 292.  10/11/2015.  WUJ-8.119TSH-1.243.  10- 25th 2016.  WBC 12.4 hemoglobin 9.8 platelets 410.  09/04/2015.  Sodium 136 potassium 3.9 BUN 36 creatinine 1.38    BMP Latest Ref Rng 08/25/2015 08/24/2015 08/23/2015  Glucose 65 - 99 mg/dL 147(W112(H) 295(A146(H) 213(Y140(H)  BUN 6 - 20 mg/dL 86(V23(H) 78(I24(H) 69(G22(H)  Creatinine 0.61 - 1.24 mg/dL 2.951.22 2.84(X1.42(H) 3.24(M1.43(H)  BUN/Creat Ratio 10 - 22 - - -  Sodium 135 - 145 mmol/L 138 139 137  Potassium 3.5 - 5.1 mmol/L 3.9 4.3 3.7  Chloride 101 - 111 mmol/L 108 108 102  CO2 22 - 32 mmol/L 27 26 26   Calcium 8.9 - 10.3 mg/dL 8.0(L) 8.0(L) 9.3   CBC Latest Ref Rng 08/26/2015 08/25/2015 08/24/2015  WBC 4.0 - 10.5 K/uL 11.9(H) 12.0(H) 13.5(H)  Hemoglobin 13.0 - 17.0 g/dL 0.1(U9.4(L) 8.1(L) 10.0(L)  Hematocrit 39.0 - 52.0 % 28.3(L) 24.5(L) 30.0(L)  Platelets 150 - 400 K/uL 143(L) 129(L) 170   Past Medical History  Diagnosis Date  . Alzheimer disease   .  Hypothyroidism   . Hyperlipidemia   . Neuropathy of lower extremity   . Urinary incontinence due to cognitive impairment 06/08/2015  . Other symptoms involving cardiovascular system 04/23/2012  . CKD (chronic kidney disease)    Past Surgical History  Procedure Laterality Date  . Hernia repair    . Intramedullary (im) nail intertrochanteric Left 08/23/2015    Procedure: INTRAMEDULLARY (IM) NAIL INTERTROCHANTRIC;  Surgeon: Cammy Copa, MD;  Location: [facility];  Service: Orthopedics;   Laterality: Left;   Current Outpatient Prescriptions on File Prior to Visit  Medication Sig Dispense Refill  . acetaminophen (TYLENOL) 325 MG tablet Take 2 tablets (650 mg total) by mouth every 6 (six) hours as needed for mild pain (or Fever >/= 101). 30 tablet 0  .  Xarelto 10 mg daily    30 tablet 0  . Cholecalciferol (VITAMIN D3) 2000 UNITS capsule Take 2,000 Units by mouth daily.    Marland Kitchen donepezil (ARICEPT) 10 MG tablet TAKE 1 TABLET DAILY 90 tablet 0  . feeding supplement, ENSURE ENLIVE, (ENSURE ENLIVE) LIQD Take 237 mLs by mouth 3 (three) times daily between meals. 237 mL 12  . ferrous sulfate 325 (65 FE) MG tablet Take 1 tablet (325 mg total) by mouth 2 (two) times daily with a meal. 60 tablet 3  . gabapentin (NEURONTIN) 100 MG capsule Take 2 capsules (200 mg total) by mouth at bedtime. 30 capsule 0  . HYDROcodone-acetaminophen (NORCO/VICODIN) 5-325 MG tablet Take 1-2 tablets by mouth every 6 (six) hours as needed for moderate pain. 30 tablet 0  . HYDROcodone-acetaminophen (NORCO/VICODIN) 5-325 MG tablet Take 1 tablet by mouth every 6 (six) hours as needed for moderate pain. 30 tablet 0  . levothyroxine (SYNTHROID, LEVOTHROID) 75 MCG tablet Take 1 tablet (75 mcg total) by mouth daily. 90 tablet 3  . NAMENDA XR 28 MG CP24 24 hr capsule TAKE (1) CAPSULE DAILY 30 capsule 2  . polyethylene glycol (MIRALAX / GLYCOLAX) packet Take 17 g by mouth daily. 14 each 0  . senna (SENOKOT) 8.6 MG TABS tablet Take 1 tablet (8.6 mg total) by mouth at bedtime. 120 each 0   Of note Namenda and Aricept have been discontinued  Social he apparently lived with his wife since it is listed that she is the one who heard him fall. l. Beyond this is exact functional status is unclear  reports that he quit smoking about 43 years ago. His smoking use included Cigarettes and Pipe. He quit after 40 years of use. He quit smokeless tobacco use about 43 years ago. His smokeless tobacco use included Chew. He reports that he  does not drink alcohol or use illicit drugs.  Family hx--- his mother is deceased. his father is deceased.    Review of systems; this is not possible from the patient secondary to the advanced state of his dementia--she see history of present illness nursing staff does not report any complaints of pain or shortness of breath or abdominal discomfort no nausea or vomiting  Physical examination  Temperature 96.9 pulse 76 respirations 18 blood pressure 124/58 weight is 125.2  Gen. thin elderly man but not in overt distress.--Sitting comfortably in his wheelchair His skin is warm and dry HEENT; oropharynx is clear visual acuity appears grossly intact He has prescription lenses Respiratory; clear entry bilaterally no crackles or wheezes. He does have shallow air entry--there is no labored breathing Cardiac heart sounds are normal he has a grade 2/6 pansystolic murmur --no significant lower extremity edema  Abdomen; soft with positive bowel sounds   Extremities;  General frailty is able to move all extremities ambulates in a wheelchair   Neurologic; no cooperation with his exam but I do not see any overt deficits or lateralizing findings his speech is clear although he is confused and doesn't speak much-. Mental status; Appears to have severe dementia although hearing difficulties may contribute this  L .        Impression/plan  Syncope-thought possibly secondary to dehydration and weakness from surgery- CT chest was negative for pulmonary embolism-he is on Xarelto for  DVT prophylaxis --question whether he will need to be on this on discharge-this will have to be addressed before he is discharged--whether this should continue after discharge from facility.--We will await Dr. Jannetta Quint input-- Syncope has not reoccured  Elevated d-dimer again CT of the chest has been negative he does not show any sign of respiratory distress at this point will monitor-- on Xarelt oempiraccly  #3  left hip fracture status -this appears relatively stable he is followed by orthopedics currently on Xarelto for anticoagulation DVT prophylaxis   4-- severe Alzheimer's disease; his exact functional status is not really clear--he appears essentially to be at his baseline--family has requested be off Aricept and Namenda and this has been done  5 hypothyroidism TSH was within the normal range in the hospital at 1.55  #6 apparently previous history hematuria Apparently this has resolved he's been treated for Pseudomonas--white count has normalized  #7 chronic renal failure stage III with a baseline creatinine of 1.3-1.5--most recently 1.04.     #8 leukocytosis Per chart review appears this has some variability ranging from normal levels to slightly elevated 12-13 range--most recent white count 9.5 on December 7    #9 anemia he was transfused 1 unit and put on iron. Most recent hemoglobin 14.4 he is on iron--I suspect this can be discontinue  At this point patient appears to be at baseline-again it is my understanding he will be going into more of an assisted-living dementia type situation but I will have to clarify this with nursing-he appears to be stable clinically although complicated with his significant dementia  [id number]-of note greater than 30 minutes spent on this discharge summary-greater than 50% of time spent coordinating plan of care for numerous diagnoses

## 2015-10-27 NOTE — Telephone Encounter (Signed)
Joseph [name], please call patient's daughter

## 2015-10-27 NOTE — Telephone Encounter (Signed)
Spoke with daughter and she states that [facility] will arrange for all home health and needs.

## 2015-10-31 ENCOUNTER — Telehealth: Payer: Self-pay | Admitting: Family Medicine

## 2015-11-03 ENCOUNTER — Encounter: Payer: Self-pay | Admitting: Internal Medicine

## 2015-11-03 ENCOUNTER — Non-Acute Institutional Stay (SKILLED_NURSING_FACILITY): Payer: Medicare Other | Admitting: Internal Medicine

## 2015-11-03 DIAGNOSIS — R54 Age-related physical debility: Secondary | ICD-10-CM

## 2015-11-03 DIAGNOSIS — S72142D Displaced intertrochanteric fracture of left femur, subsequent encounter for closed fracture with routine healing: Secondary | ICD-10-CM | POA: Diagnosis not present

## 2015-11-03 DIAGNOSIS — F0280 Dementia in other diseases classified elsewhere without behavioral disturbance: Secondary | ICD-10-CM

## 2015-11-03 DIAGNOSIS — N183 Chronic kidney disease, stage 3 unspecified: Secondary | ICD-10-CM

## 2015-11-03 DIAGNOSIS — G309 Alzheimer's disease, unspecified: Secondary | ICD-10-CM

## 2015-11-03 NOTE — Progress Notes (Signed)
Patient ID: Joseph [name] Joseph [name], male   DOB: [birth year], 81 y.o.   MRN: [id number]        this is an acute  visit   Facility; [facility]   Chief complaint; - Acute visit secondary family concerns about patient going home  HPI--; this is a patient who lived in his own home. He was found having fallen by his wife. He suffered an intertrochanteric hip fracture and underwent a left hip ORIF. He had postoperative anemia was transfused one unit that. He went on to have an MRI of the C-spine There was no acute cervical spine fracture or contusion. It was significant and degenerative change with the right-sided foraminal stenosis at C4/5 C5/6 as well as advanced degenerative cervical spondylosis. The patient has chronic renal failure stage III this seems to remain stable with most recent creatinine 1.04 BUN 23 on December 7.  About 8 weeks ago he did have a syncopal episode in therapy he did go to the ER for evaluation which did not show any acute process.  CT of his head was negative for any acute process urine culture did not really show significant growth-labs were fairly unremarkable-thoughtt syncopal episode was possibly a combination of dehydration and weakness from recent surgery.  Dr. Leanord Hawkingobson did order a d-dimer however which came back elevated at 7.07-subsequently a CT of his chest that was negative for pulmonary embolism-.  Dr. Leanord Hawkingobson did empirically start him on Xarelto--which he remains on for DVT prophylaxis  He also was noted to have an elevated white count eventually urine culture did grow Pseudomonas and he was treated for this-appears repeat culture was negative- most recent white count was normal at 9.5 on December 7  One point it was thought to running low blood pressures but this appears to have stabilized recent readings  122/62    Patient has been safe for discharge however his daughter is here today and is quite concerned saying there is not really enough support at home at this  point-patient will need extensive care with his severe dementia.  She is also concerned saying he was able to feed himself at home but appears to have decline where he now needs assistance with feeding.  Apparently patient has had a fairly significant decline in mental status ever since  having his surgery-.  We did discuss possible challenges here including fact he is in a different environment from home with new faces and he underwent surgery which often can lead to some progression of dementia sometimes which eventually improves and other times continues to be a challenge to return to any sort of baseline  He continues to lose weight gradually   Labs  12/ 7 2016.  Sodium 144 potassium 4.7 BUN 23 creatinine 1.04.  WBC 9.5 hemoglobin 14.4 platelets 292.  10/11/2015.  WUJ-8.119TSH-1.243.  10- 25th 2016.  WBC 12.4 hemoglobin 9.8 platelets 410.  09/04/2015.  Sodium 136 potassium 3.9 BUN 36 creatinine 1.38    BMP Latest Ref Rng 08/25/2015 08/24/2015 08/23/2015  Glucose 65 - 99 mg/dL 147(W112(H) 295(A146(H) 213(Y140(H)  BUN 6 - 20 mg/dL 86(V23(H) 78(I24(H) 69(G22(H)  Creatinine 0.61 - 1.24 mg/dL 2.951.22 2.84(X1.42(H) 3.24(M1.43(H)  BUN/Creat Ratio 10 - 22 - - -  Sodium 135 - 145 mmol/L 138 139 137  Potassium 3.5 - 5.1 mmol/L 3.9 4.3 3.7  Chloride 101 - 111 mmol/L 108 108 102  CO2 22 - 32 mmol/L 27 26 26   Calcium 8.9 - 10.3 mg/dL 8.0(L) 8.0(L) 9.3   CBC  Latest Ref Rng 08/26/2015 08/25/2015 08/24/2015  WBC 4.0 - 10.5 K/uL 11.9(H) 12.0(H) 13.5(H)  Hemoglobin 13.0 - 17.0 g/dL 1.6(X) 8.1(L) 10.0(L)  Hematocrit 39.0 - 52.0 % 28.3(L) 24.5(L) 30.0(L)  Platelets 150 - 400 K/uL 143(L) 129(L) 170   Past Medical History  Diagnosis Date  . Alzheimer disease   . Hypothyroidism   . Hyperlipidemia   . Neuropathy of lower extremity   . Urinary incontinence due to cognitive impairment 06/08/2015  . Other symptoms involving cardiovascular system 04/23/2012  . CKD (chronic kidney disease)    Past Surgical History  Procedure  Laterality Date  . Hernia repair    . Intramedullary (im) nail intertrochanteric Left 08/23/2015    Procedure: INTRAMEDULLARY (IM) NAIL INTERTROCHANTRIC;  Surgeon: Cammy Copa, MD;  Location: [facility];  Service: Orthopedics;  Laterality: Left;   Current Outpatient Prescriptions on File Prior to Visit  Medication Sig Dispense Refill  . acetaminophen (TYLENOL) 325 MG tablet Take 2 tablets (650 mg total) by mouth every 6 (six) hours as needed for mild pain (or Fever >/= 101). 30 tablet 0  .  Xarelto 10 mg daily    30 tablet 0  . Cholecalciferol (VITAMIN D3) 2000 UNITS capsule Take 2,000 Units by mouth daily.    Marland Kitchen donepezil (ARICEPT) 10 MG tablet TAKE 1 TABLET DAILY 90 tablet 0  . feeding supplement, ENSURE ENLIVE, (ENSURE ENLIVE) LIQD Take 237 mLs by mouth 3 (three) times daily between meals. 237 mL 12  . ferrous sulfate 325 (65 FE) MG tablet Take 1 tablet (325 mg total) by mouth 2 (two) times daily with a meal. 60 tablet 3  . gabapentin (NEURONTIN) 100 MG capsule Take 2 capsules (200 mg total) by mouth at bedtime. 30 capsule 0  . HYDROcodone-acetaminophen (NORCO/VICODIN) 5-325 MG tablet Take 1-2 tablets by mouth every 6 (six) hours as needed for moderate pain. 30 tablet 0  . HYDROcodone-acetaminophen (NORCO/VICODIN) 5-325 MG tablet Take 1 tablet by mouth every 6 (six) hours as needed for moderate pain. 30 tablet 0  . levothyroxine (SYNTHROID, LEVOTHROID) 75 MCG tablet Take 1 tablet (75 mcg total) by mouth daily. 90 tablet 3  . NAMENDA XR 28 MG CP24 24 hr capsule TAKE (1) CAPSULE DAILY 30 capsule 2  . polyethylene glycol (MIRALAX / GLYCOLAX) packet Take 17 g by mouth daily. 14 each 0  . senna (SENOKOT) 8.6 MG TABS tablet Take 1 tablet (8.6 mg total) by mouth at bedtime. 120 each 0   Of note Namenda and Aricept have been discontinued  Social he apparently lived with his wife since it is listed that she is the one who heard him fall. l. Beyond this is exact functional status is unclear   reports that he quit smoking about 43 years ago. His smoking use included Cigarettes and Pipe. He quit after 40 years of use. He quit smokeless tobacco use about 43 years ago. His smokeless tobacco use included Chew. He reports that he does not drink alcohol or use illicit drugs.  Family hx--- his mother is deceased. his father is deceased.    Review of systems; this is not possible from the patient secondary to the advanced state of his dementia--she see history of present illness nursing staff does not report any complaints of pain or shortness of breath or abdominal discomfort no nausea or vomiting Continues loose some weight appears to have fairly severe dementia which according to his daughter is a progression from the way matters were at home  Physical  examination  Temperature is 97.9 pulse 70 respirations 18 blood pressure 122/62  Gen. thin elderly man but not in overt distress.--Sitting comfortably in his wheelchair His skin is warm and dry--he does have a crusted indurated area on his heel there is no surrounding erythema or from what I can tell tenderness or sign of acute infection here but this will have to be watched wound care is monitoring this HEENT; oropharynx is clear visual acuity appears grossly intact He has prescription lenses Respiratory; clear entry bilaterally no crackles or wheezes. He does have shallow air entry--there is no labored breathing Cardiac heart sounds are normal he has a grade 2/6 pansystolic murmur --no significant lower extremity edema Abdomen; soft with positive bowel sounds   Extremities;  General frailty is able to move all extremities ambulates in a wheelchair   Neurologic; no cooperation with his exam but I do not see any overt deficits or lateralizing findings his speech is clear although he is confused and doesn't speak much-. Mental status; Appears to have severe dementia although hearing difficulties may contribute this--does not really  follow verbal commands  L .        Impression/plan   1 history of dementia-his daughter has significant concerns about level support at home with his care needs with what appeared to be fairly significant dementia.  I did discuss possible etiologies as noted above for progressing dementia including recent surgery and change in settings seeing new faces and new routines She is also concerned about the area on his heel-he should have protective boots on when in bed this will certainly have to be encouraged -We also discussed possible options upon discharge-I agree that going home does not appear to be a viable alternative per discussion with daughter.  An assisted-living facility-dementia facility would probably be the best option and daughter will try to investigate this with social worker's help--social worker was in room for discussion.  Also will update labs including a metabolic panel and CBC these appear to show stability with his renal function and anemia with most recent hemoglobin stable at 14.4 and creatinine of 1.04.  Patient is a fragile individual -staff is encouraging by mouth intake with restorative dining but apparently still challenge.  We will await Dr. Jannetta Quint input on this as well.  [id number]

## 2015-11-03 NOTE — Telephone Encounter (Signed)
Daughter picked up list of 3 names today

## 2015-11-04 ENCOUNTER — Other Ambulatory Visit (HOSPITAL_COMMUNITY)
Admission: AD | Admit: 2015-11-04 | Discharge: 2015-11-04 | Disposition: A | Discharge status: Home / Self Care | Payer: No Typology Code available for payment source | Source: Skilled Nursing Facility | Attending: Internal Medicine | Admitting: Internal Medicine

## 2015-11-04 DIAGNOSIS — N183 Chronic kidney disease, stage 3 (moderate): Secondary | ICD-10-CM | POA: Insufficient documentation

## 2015-11-04 LAB — COMPREHENSIVE METABOLIC PANEL
ALBUMIN: 2.9 g/dL — AB (ref 3.5–5.0)
ALT: 17 U/L (ref 17–63)
ANION GAP: 5 (ref 5–15)
AST: 19 U/L (ref 15–41)
Alkaline Phosphatase: 118 U/L (ref 38–126)
BUN: 30 mg/dL — ABNORMAL HIGH (ref 6–20)
CHLORIDE: 105 mmol/L (ref 101–111)
CO2: 29 mmol/L (ref 22–32)
Calcium: 8.8 mg/dL — ABNORMAL LOW (ref 8.9–10.3)
Creatinine, Ser: 1.02 mg/dL (ref 0.61–1.24)
GFR calc non Af Amer: >60 mL/min (ref >60)
Glucose, Bld: 104 mg/dL — ABNORMAL HIGH (ref 65–99)
Potassium: 3.8 mmol/L (ref 3.5–5.1)
SODIUM: 139 mmol/L (ref 135–145)
Total Bilirubin: 0.6 mg/dL (ref 0.3–1.2)
Total Protein: 5.9 g/dL — ABNORMAL LOW (ref 6.5–8.1)

## 2015-11-04 LAB — CBC
HCT: 37.4 % — ABNORMAL LOW (ref 39.0–52.0)
Hemoglobin: 12.3 g/dL — ABNORMAL LOW (ref 13.0–17.0)
MCH: 31.7 pg (ref 26.0–34.0)
MCHC: 32.9 g/dL (ref 30.0–36.0)
MCV: 96.4 fL (ref 78.0–100.0)
PLATELETS: 225 10*3/uL (ref 150–400)
RBC: 3.88 MIL/uL — AB (ref 4.22–5.81)
RDW: 14.2 % (ref 11.5–15.5)
WBC: 8.5 10*3/uL (ref 4.0–10.5)

## 2015-11-08 ENCOUNTER — Other Ambulatory Visit: Payer: Self-pay | Admitting: *Deleted

## 2015-11-08 ENCOUNTER — Encounter: Payer: Self-pay | Admitting: *Deleted

## 2015-11-08 ENCOUNTER — Non-Acute Institutional Stay (SKILLED_NURSING_FACILITY): Payer: Medicare Other | Admitting: Internal Medicine

## 2015-11-08 DIAGNOSIS — L97411 Non-pressure chronic ulcer of right heel and midfoot limited to breakdown of skin: Secondary | ICD-10-CM

## 2015-11-08 DIAGNOSIS — F0280 Dementia in other diseases classified elsewhere without behavioral disturbance: Secondary | ICD-10-CM

## 2015-11-08 DIAGNOSIS — L89623 Pressure ulcer of left heel, stage 3: Secondary | ICD-10-CM

## 2015-11-08 DIAGNOSIS — R2681 Unsteadiness on feet: Secondary | ICD-10-CM

## 2015-11-08 DIAGNOSIS — G309 Alzheimer's disease, unspecified: Principal | ICD-10-CM

## 2015-11-08 DIAGNOSIS — R531 Weakness: Secondary | ICD-10-CM

## 2015-11-10 ENCOUNTER — Encounter: Payer: Self-pay | Admitting: Internal Medicine

## 2015-11-10 ENCOUNTER — Non-Acute Institutional Stay (SKILLED_NURSING_FACILITY): Payer: Medicare Other | Admitting: Internal Medicine

## 2015-11-10 DIAGNOSIS — S72142D Displaced intertrochanteric fracture of left femur, subsequent encounter for closed fracture with routine healing: Secondary | ICD-10-CM | POA: Diagnosis not present

## 2015-11-10 DIAGNOSIS — E038 Other specified hypothyroidism: Secondary | ICD-10-CM | POA: Diagnosis not present

## 2015-11-10 DIAGNOSIS — F0280 Dementia in other diseases classified elsewhere without behavioral disturbance: Secondary | ICD-10-CM

## 2015-11-10 DIAGNOSIS — N183 Chronic kidney disease, stage 3 unspecified: Secondary | ICD-10-CM

## 2015-11-10 DIAGNOSIS — G309 Alzheimer's disease, unspecified: Secondary | ICD-10-CM

## 2015-11-10 NOTE — Progress Notes (Signed)
Patient ID: Joseph [name], male   DOB: 07/01/1926, 81 y.o.   MRN: [id number]         this is an acute  Visit-pre discharge review   Facility; [facility]   Chief complaint; -acute visit-predischarge review   HPI--; this is a patient who lived in his own home. He was found having fallen by his wife. He suffered an intertrochanteric hip fracture and underwent a left hip ORIF. He had postoperative anemia was transfused one unit that. He went on to have an MRI of the C-spine There was no acute cervical spine fracture or contusion. It was significant and degenerative change with the right-sided foraminal stenosis at C4/5 C5/6 as well as advanced degenerative cervical spondylosis. The patient has chronic renal failure stage III this seems to remain stable with most recent creatinine 1.02 BUN 30 on December 24  About 9 weeks ago he did have a syncopal episode in therapy he did go to the ER for evaluation which did not show any acute process.  CT of his head was negative for any acute process urine culture did not really show significant growth-labs were fairly unremarkable-thoughtt syncopal episode was possibly a combination of dehydration and weakness from recent surgery.  Dr. Leanord Hawking did order a d-dimer however which came back elevated at 7.07-subsequently a CT of his chest that was negative for pulmonary embolism-.  Dr. Leanord Hawking did empirically start him on Xarelto- for DVT prophylaxis--this recently was discontinued  He also was noted to have an elevated white count eventually urine culture did grow Pseudomonas and he was treated for this-appears repeat culture was negative- most recent white count was normal at 8.5 on December 24  One point it was thought to running low blood pressures but this appears to have stabilized recent readings  122/62--130/78--114/54   Patient was slated for discharge  previously  and did speak to his daughter last week who was quite concerned about the level family  support for him at home-however apparently social worker nursing has talked to his wife and son  Who  will be involved as well as his daughter-apparently matters are a bit more comfortable now-the family has told the nurse and social worker that if they don't feel they can handle his situation they will seek assisted living.  Patient appears to be stable apparently did slide out of his wheelchair earlier today with no apparent injuries-continues to appear to have significant dementia which apparently is progressed ever since his surgery.  He will need continued PT and OT for strengthening at home as well as a wheelchair for ambulation-and nursing support.  He does have a wound to his left heel that is slowly improving and will need continued follow-up.  He has lost about 10 pounds during his stay here diet has been adjusted family is aware of this by mouth intake obviously will have to be encouraged suspect some progressing dementia may be contributing to this as well.       Labs  11/04/2015.  WBC 8.5 hemoglobin 12.3 platelets 225.  Sodium 139 potassium 3.8 BUN 30 creatinine 1.02.  Albumin 2.9-otherwise liver function tests within normal limits  12/ 7 2016.  Sodium 144 potassium 4.7 BUN 23 creatinine 1.04.  WBC 9.5 hemoglobin 14.4 platelets 292.  10/11/2015.  WUJ-8.119.  10- 25th 2016.  WBC 12.4 hemoglobin 9.8 platelets 410.  09/04/2015.  Sodium 136 potassium 3.9 BUN 36 creatinine 1.38    BMP Latest Ref Rng 08/25/2015 08/24/2015 08/23/2015  Glucose  65 - 99 mg/dL 401(U) 272(Z) 366(Y)  BUN 6 - 20 mg/dL 40(H) 47(Q) 25(Z)  Creatinine 0.61 - 1.24 mg/dL 5.63 8.75(I) 4.33(I)  BUN/Creat Ratio 10 - 22 - - -  Sodium 135 - 145 mmol/L 138 139 137  Potassium 3.5 - 5.1 mmol/L 3.9 4.3 3.7  Chloride 101 - 111 mmol/L 108 108 102  CO2 22 - 32 mmol/L Calcium 8.9 - 10.3 mg/dL 8.0(L) 8.0(L) 9.3   CBC Latest Ref Rng 08/26/2015 08/25/2015 08/24/2015  WBC 4.0 - 10.5 K/uL  11.9(H) 12.0(H) 13.5(H)  Hemoglobin 13.0 - 17.0 g/dL 9.5(J) 8.1(L) 10.0(L)  Hematocrit 39.0 - 52.0 % 28.3(L) 24.5(L) 30.0(L)  Platelets 150 - 400 K/uL 143(L) 129(L) 170   Past Medical History  Diagnosis Date  . Alzheimer disease   . Hypothyroidism   . Hyperlipidemia   . Neuropathy of lower extremity   . Urinary incontinence due to cognitive impairment 06/08/2015  . Other symptoms involving cardiovascular system 04/23/2012  . CKD (chronic kidney disease)    Past Surgical History  Procedure Laterality Date  . Hernia repair    . Intramedullary (im) nail intertrochanteric Left 08/23/2015    Procedure: INTRAMEDULLARY (IM) NAIL INTERTROCHANTRIC;  Surgeon: Cammy Copa, MD;  Location: [facility];  Service: Orthopedics;  Laterality: Left;   Current Outpatient Prescriptions on File Prior to Visit  Medication Sig Dispense Refill  . acetaminophen (TYLENOL) 325 MG tablet Take 2 tablets (650 mg total) by mouth every 6 (six) hours as needed for mild pain (or Fever >/= 101). 30 tablet 0  .        0  . Cholecalciferol (VITAMIN D3) 2000 UNITS capsule Take 2,000 Units by mouth daily.    Marland Kitchen donepezil (ARICEPT) 10 MG tablet TAKE 1 TABLET DAILY 90 tablet 0  . feeding supplement, ENSURE ENLIVE, (ENSURE ENLIVE) LIQD Take 237 mLs by mouth 3 (three) times daily between meals. 237 mL 12  . ferrous sulfate 325 (65 FE) MG tablet Take 1 tablet (325 mg total) by mouth 2 (two) times daily with a meal. 60 tablet 3  . gabapentin (NEURONTIN) 100 MG capsule Take 2 capsules (200 mg total) by mouth at bedtime. 30 capsule 0  . HYDROcodone-acetaminophen (NORCO/VICODIN) 5-325 MG tablet Take 1-2 tablets by mouth every 6 (six) hours as needed for moderate pain. 30 tablet 0  . HYDROcodone-acetaminophen (NORCO/VICODIN) 5-325 MG tablet Take 1 tablet by mouth every 6 (six) hours as needed for moderate pain. 30 tablet 0  . levothyroxine (SYNTHROID, LEVOTHROID) 75 MCG tablet Take 1 tablet (75 mcg total) by mouth daily. 90 tablet  3  . NAMENDA XR 28 MG CP24 24 hr capsule TAKE (1) CAPSULE DAILY 30 capsule 2  . polyethylene glycol (MIRALAX / GLYCOLAX) packet Take 17 g by mouth daily. 14 each 0  . senna (SENOKOT) 8.6 MG TABS tablet Take 1 tablet (8.6 mg total) by mouth at bedtime. 120 each 0   Of note Namenda and Aricept have been discontinued  Social he apparently lived with his wife since it is listed that she is the one who heard him fall. l. Beyond this is exact functional status is unclear  reports that he quit smoking about 43 years ago. His smoking use included Cigarettes and Pipe. He quit after 40 years of use. He quit smokeless tobacco use about 43 years ago. His smokeless tobacco use included Chew. He reports that he does not drink alcohol or use illicit drugs.  Family hx--- his mother  is deceased. his father is deceased.    Review of systems; this is not possible from the patient secondary to the advanced state of his dementia--e see history of present illness nursing staff does not report any complaints of pain or shortness of breath or abdominal discomfort no nausea or vomiting Continues  some weight  loss appears to have fairly severe dementia which according to his daughter is a progression from the way matters were at home  Physical examination  Temperature 97.2 pulse 96 respirations 18 blood pressure 132/78  Gen. thin elderly man but not in overt distress.--Sitting comfortably in his wheelchair His skin is warm and dry-- Left heel wound is currently covered this is followed by nursing apparently is stable with no sign of infection HEENT; oropharynx is clear visual acuity appears grossly intact He has prescription lenses Respiratory; clear entry bilaterally no crackles or wheezes. He does have shallow air entry--there is no labored breathing Cardiac heart sounds are normal he has a grade 2/6 pansystolic murmur --no significant lower extremity edema Abdomen; soft with positive bowel sounds--non  tender   Extremities;  General frailty is able to move all extremities ambulates in a wheelchair--could not appreciate any deformity or pain with gentle range of motion of his extremities   Neurologic; no cooperation with his exam but I do not see any overt deficits or lateralizing findings his speech is clear although he is confused and doesn't speak much-. He is speaking some today Mental status; Appears to have severe dementia although hearing difficulties may contribute this--does not really follow verbal commands  Assessment and plan.  #1 history of left hip fracture status post repair-this appears to be relatively stable he is followed by orthopedics --again his Xarelto has been discontinued this was done prophylactically for DVT  #2 dementia-this appears to be progressive apparently he has significantly progressed since his surgery this has been discussed previously with family about possible etiologies-family will be taking him home they will have some support nursing support therapy-however  If  family does not feel they can take care of the situation they will seek assisted living  #3 hypothyroidism TSH was within normal limits in the hospital at 1.55 he is on Synthroid.  #4 history of chronic renal failure stage III-this actually appears to be somewhat improved with the recent creatinine 1.02 we'll update this before discharge.  #5 history of anemia-she did require transfusion in the hospital and placed on iron and iron has been discontinued secondary to hemoglobin improving significantly will update this before discharge-most recent hemoglobin is 12.3 but this starts going down suspected tubal restart the iron.  #6-history of syncope there's been no reoccurrence again this was thought possibly secondary to dehydration and weakness from surgery.  Again patient will be going home with family who is quite supportive they will seek assisted living if they feel he needs the added  care-will update lab work including a CBC and BMP before discharge-  [id number] .  User Date and Time       Dannielle Karvonen, [facility] 04/23/2012 12:38 PM

## 2015-11-11 ENCOUNTER — Other Ambulatory Visit (HOSPITAL_COMMUNITY)
Admission: RE | Admit: 2015-11-11 | Discharge: 2015-11-11 | Disposition: A | Discharge status: Home / Self Care | Payer: Medicare Other | Source: Skilled Nursing Facility | Attending: Internal Medicine | Admitting: Internal Medicine

## 2015-11-11 LAB — CBC WITH DIFFERENTIAL/PLATELET
BASOS PCT: 1 %
Basophils Absolute: 0.1 10*3/uL (ref 0.0–0.1)
EOS ABS: 0.3 10*3/uL (ref 0.0–0.7)
EOS PCT: 3 %
HCT: 38.3 % — ABNORMAL LOW (ref 39.0–52.0)
HEMOGLOBIN: 12.7 g/dL — AB (ref 13.0–17.0)
Lymphocytes Relative: 20 %
Lymphs Abs: 2 10*3/uL (ref 0.7–4.0)
MCH: 31.8 pg (ref 26.0–34.0)
MCHC: 33.2 g/dL (ref 30.0–36.0)
MCV: 95.8 fL (ref 78.0–100.0)
MONOS PCT: 15 %
Monocytes Absolute: 1.5 10*3/uL — ABNORMAL HIGH (ref 0.1–1.0)
NEUTROS PCT: 61 %
Neutro Abs: 5.9 10*3/uL (ref 1.7–7.7)
PLATELETS: 241 10*3/uL (ref 150–400)
RBC: 4 MIL/uL — ABNORMAL LOW (ref 4.22–5.81)
RDW: 14.1 % (ref 11.5–15.5)
WBC: 9.7 10*3/uL (ref 4.0–10.5)

## 2015-11-11 LAB — BASIC METABOLIC PANEL
Anion gap: 5 (ref 5–15)
BUN: 24 mg/dL — AB (ref 6–20)
CALCIUM: 9 mg/dL (ref 8.9–10.3)
CO2: 28 mmol/L (ref 22–32)
CREATININE: 1.16 mg/dL (ref 0.61–1.24)
Chloride: 106 mmol/L (ref 101–111)
GFR calc non Af Amer: 55 mL/min — ABNORMAL LOW (ref >60)
Glucose, Bld: 103 mg/dL — ABNORMAL HIGH (ref 65–99)
Potassium: 3.8 mmol/L (ref 3.5–5.1)
SODIUM: 139 mmol/L (ref 135–145)

## 2015-11-13 NOTE — Progress Notes (Signed)
Patient ID: Joseph [name], male   DOB: [birth year], 82 y.o.   MRN: [id number]                PROGRESS NOTE  DATE:  11/08/2015         FACILITY: [facility]                    LEVEL OF CARE:   SNF   Acute Visit            CHIEF COMPLAINT:  Follow up left heel wound.     HISTORY OF PRESENT ILLNESS:  [name] is an 82 year-old man with advanced Alzheimer's disease, who was admitted here in mid October after suffering a left hip fracture.    My initial H&P does not list any pressure areas, although the wound care nurse here tells me that he had wounds on both heels and his coccyx.  I do not really recall this.    He had a syncopal spell, also in October.  There was some concern about pulmonary embolism based on some swelling of his leg and elevated D-dimer.  A CT scan of the chest did not show this.  He has been on Xarelto for DVT prophylaxis.  I think this can probably stop.    LABORATORY DATA:  Lab work done on 11/04/2015:    Comprehensive metabolic panel:  Normal, except for an albumin of 2.9.    CBC:  Mildly low hemoglobin of 12.3.  Otherwise, normal.     REVIEW OF SYSTEMS:   Not possible from the patient secondary to dementia.   GENERAL:  Per the nursing staff, he is not eating well.    PHYSICAL EXAMINATION:   GENERAL APPEARANCE:  The patient is not in any distress.  He is not verbal.    CHEST/RESPIRATORY:  Shallow, but clear air entry bilaterally.    CARDIOVASCULAR:   CARDIAC:  Heart sounds are normal.  He does not appear to be dehydrated.       GASTROINTESTINAL:   ABDOMEN:  No masses.     LIVER/SPLEEN/KIDNEYS:  No liver, no spleen.   SKIN:   INSPECTION:  Left heel:  He has a pressure area on the left heel.  This is covered with adherent slough.  I debrided this using pickups and a #10 blade.  This will need Santyl and a foam cover.    ASSESSMENT/PLAN:                Stage III pressure ulcer on the left heel.   This was successfully debrided.  He will need  continued Santyl-based dressings here.    Syncopal spell in mid October and a concern with regards to pulmonary embolism.  A CT angio of the chest at the time did not show a pulmonary embolism.  I kept him on Xarelto for DVT prophylaxis, although I think that can stop.     CPT CODE: [id number]

## 2015-11-15 ENCOUNTER — Telehealth: Payer: Self-pay | Admitting: *Deleted

## 2015-11-15 DIAGNOSIS — L89213 Pressure ulcer of right hip, stage 3: Secondary | ICD-10-CM | POA: Diagnosis not present

## 2015-11-15 DIAGNOSIS — Z9181 History of falling: Secondary | ICD-10-CM | POA: Diagnosis not present

## 2015-11-15 DIAGNOSIS — Z4800 Encounter for change or removal of nonsurgical wound dressing: Secondary | ICD-10-CM | POA: Diagnosis not present

## 2015-11-15 DIAGNOSIS — L89624 Pressure ulcer of left heel, stage 4: Secondary | ICD-10-CM | POA: Diagnosis not present

## 2015-11-15 DIAGNOSIS — E039 Hypothyroidism, unspecified: Secondary | ICD-10-CM | POA: Diagnosis not present

## 2015-11-15 DIAGNOSIS — N183 Chronic kidney disease, stage 3 (moderate): Secondary | ICD-10-CM | POA: Diagnosis not present

## 2015-11-15 DIAGNOSIS — G309 Alzheimer's disease, unspecified: Secondary | ICD-10-CM | POA: Diagnosis not present

## 2015-11-15 DIAGNOSIS — F0281 Dementia in other diseases classified elsewhere with behavioral disturbance: Secondary | ICD-10-CM | POA: Diagnosis not present

## 2015-11-15 DIAGNOSIS — N400 Enlarged prostate without lower urinary tract symptoms: Secondary | ICD-10-CM | POA: Diagnosis not present

## 2015-11-15 DIAGNOSIS — E785 Hyperlipidemia, unspecified: Secondary | ICD-10-CM | POA: Diagnosis not present

## 2015-11-15 DIAGNOSIS — G629 Polyneuropathy, unspecified: Secondary | ICD-10-CM | POA: Diagnosis not present

## 2015-11-15 NOTE — Telephone Encounter (Signed)
The [facility] stopped his namenda and aricept on 10/20/15, is it ok for her to start back at current dose?

## 2015-11-16 MED ORDER — DONEPEZIL HCL 5 MG PO TABS: 5.0000 mg | ORAL_TABLET | Freq: Every day | ORAL | Status: DC

## 2015-11-16 MED ORDER — MEMANTINE HCL ER 7 & 14 & 21 &28 MG PO CP24: ORAL_CAPSULE | ORAL | Status: DC

## 2015-11-16 MED ORDER — DONEPEZIL HCL 10 MG PO TABS: 10.0000 mg | ORAL_TABLET | Freq: Every day | ORAL | Status: DC

## 2015-11-16 NOTE — Addendum Note (Signed)
Addended by: Henrene PastorECKARD, Ronald Londo on: 11/16/2015 01:05 PM   Modules accepted: Orders

## 2015-11-16 NOTE — Telephone Encounter (Signed)
Thanks Tammy 

## 2015-11-16 NOTE — Telephone Encounter (Signed)
Discussed with the clinical pharmacist about restarting. I suspect that he needs to restart it at a lower dose and build back up to the original dose over a month

## 2015-11-16 NOTE — Telephone Encounter (Addendum)
If therapy with either / both these medication has been stopped for several days then it is recommended to start at low dose and retitrate slowly.   Namenda has a dose pack which starts with 7mg  qd and increases by 7mg  every 7 days to 28mg  daily.  Aricept - restart at 5mg  once daily for 6 to 8 weeks then increase back to 10mg .  Rx's sent in to [facility]

## 2015-11-16 NOTE — Telephone Encounter (Signed)
Melanie notified.

## 2015-11-16 NOTE — Telephone Encounter (Signed)
Can you address whether he can restart at the current dose or if he should titrate back up - thank you

## 2015-11-17 ENCOUNTER — Other Ambulatory Visit: Payer: Self-pay | Admitting: *Deleted

## 2015-11-17 DIAGNOSIS — G309 Alzheimer's disease, unspecified: Secondary | ICD-10-CM | POA: Diagnosis not present

## 2015-11-17 DIAGNOSIS — G629 Polyneuropathy, unspecified: Secondary | ICD-10-CM | POA: Diagnosis not present

## 2015-11-17 DIAGNOSIS — L89213 Pressure ulcer of right hip, stage 3: Secondary | ICD-10-CM | POA: Diagnosis not present

## 2015-11-17 DIAGNOSIS — L89624 Pressure ulcer of left heel, stage 4: Secondary | ICD-10-CM | POA: Diagnosis not present

## 2015-11-17 DIAGNOSIS — N183 Chronic kidney disease, stage 3 (moderate): Secondary | ICD-10-CM | POA: Diagnosis not present

## 2015-11-17 DIAGNOSIS — F0281 Dementia in other diseases classified elsewhere with behavioral disturbance: Secondary | ICD-10-CM | POA: Diagnosis not present

## 2015-11-17 MED ORDER — MEMANTINE HCL 10 MG PO TABS: 10.0000 mg | ORAL_TABLET | Freq: Two times a day (BID) | ORAL | Status: DC

## 2015-11-17 NOTE — Telephone Encounter (Signed)
Daughter said namenda pack was over $200, Dr. Hyacinth MeekerMiller gave order for namenda 10 bid.

## 2015-11-18 DIAGNOSIS — G309 Alzheimer's disease, unspecified: Secondary | ICD-10-CM | POA: Diagnosis not present

## 2015-11-18 DIAGNOSIS — G629 Polyneuropathy, unspecified: Secondary | ICD-10-CM | POA: Diagnosis not present

## 2015-11-18 DIAGNOSIS — L89624 Pressure ulcer of left heel, stage 4: Secondary | ICD-10-CM | POA: Diagnosis not present

## 2015-11-18 DIAGNOSIS — N183 Chronic kidney disease, stage 3 (moderate): Secondary | ICD-10-CM | POA: Diagnosis not present

## 2015-11-18 DIAGNOSIS — F0281 Dementia in other diseases classified elsewhere with behavioral disturbance: Secondary | ICD-10-CM | POA: Diagnosis not present

## 2015-11-18 DIAGNOSIS — L89213 Pressure ulcer of right hip, stage 3: Secondary | ICD-10-CM | POA: Diagnosis not present

## 2015-11-20 DIAGNOSIS — F0281 Dementia in other diseases classified elsewhere with behavioral disturbance: Secondary | ICD-10-CM | POA: Diagnosis not present

## 2015-11-20 DIAGNOSIS — G309 Alzheimer's disease, unspecified: Secondary | ICD-10-CM | POA: Diagnosis not present

## 2015-11-20 DIAGNOSIS — L89624 Pressure ulcer of left heel, stage 4: Secondary | ICD-10-CM | POA: Diagnosis not present

## 2015-11-20 DIAGNOSIS — N183 Chronic kidney disease, stage 3 (moderate): Secondary | ICD-10-CM | POA: Diagnosis not present

## 2015-11-20 DIAGNOSIS — G629 Polyneuropathy, unspecified: Secondary | ICD-10-CM | POA: Diagnosis not present

## 2015-11-20 DIAGNOSIS — L89213 Pressure ulcer of right hip, stage 3: Secondary | ICD-10-CM | POA: Diagnosis not present

## 2015-11-21 ENCOUNTER — Ambulatory Visit (INDEPENDENT_AMBULATORY_CARE_PROVIDER_SITE_OTHER): Payer: Medicare Other | Admitting: Family Medicine

## 2015-11-21 ENCOUNTER — Encounter: Payer: Self-pay | Admitting: Family Medicine

## 2015-11-21 VITALS — BP 120/73 | HR 104 | Temp 96.8°F | Ht 71.0 in | Wt 130.0 lb

## 2015-11-21 DIAGNOSIS — R2681 Unsteadiness on feet: Secondary | ICD-10-CM | POA: Diagnosis not present

## 2015-11-21 DIAGNOSIS — N183 Chronic kidney disease, stage 3 unspecified: Secondary | ICD-10-CM

## 2015-11-21 DIAGNOSIS — S72142D Displaced intertrochanteric fracture of left femur, subsequent encounter for closed fracture with routine healing: Secondary | ICD-10-CM

## 2015-11-21 DIAGNOSIS — S72002D Fracture of unspecified part of neck of left femur, subsequent encounter for closed fracture with routine healing: Secondary | ICD-10-CM

## 2015-11-21 DIAGNOSIS — G309 Alzheimer's disease, unspecified: Secondary | ICD-10-CM | POA: Diagnosis not present

## 2015-11-21 DIAGNOSIS — F0280 Dementia in other diseases classified elsewhere without behavioral disturbance: Secondary | ICD-10-CM

## 2015-11-21 NOTE — Patient Instructions (Signed)
Continue with physical therapy Continue with nurse visits to monitor also her on left heel and right buttock Continue to [facility] help from [facility] Continue to keep the patient well-hydrated Try to work at getting him as much physically rehabilitated as possible Continue with current medications for dementia

## 2015-11-21 NOTE — Progress Notes (Signed)
Subjective:    Patient ID: Joseph [name], male    DOB: 02/25/1926, 82 y.o.   MRN: [id number]  HPI Patient here today for follow up from [facility] discharge. [facility] in [facility] facility. He was discharged to his home. His wife and daughter come with him to the visit today. He is getting home nurse visits because of the left foot ulcer her and therapy for his left hip. These visits are 3 times weekly. His vital signs are stable today. The patient appears to be slightly more alert than usual and was smiling and responsive somewhat to questions asked of him. He did not know my name. He was able to stand with assistance as I examine his right buttock pressure sore. He was in the wheelchair when I examine his heel. He is still incontinent of stool and bladder most the time. He is wearing a depends today. Hopefully more help will come with time to assist him in the home and his wife      Patient Active Problem List   Diagnosis Date Noted  . Syncope 08/29/2015  . Elevated d-dimer 08/29/2015  . Closed left hip fracture (HCC)   . Hip fracture (HCC) 08/23/2015  . Intertrochanteric fracture of left hip (HCC) 08/23/2015  . Chronic kidney disease, stage III (moderate) 08/23/2015  . Lower extremity neuropathy 08/23/2015  . Leukocytosis 08/23/2015  . Arthropathy of cervical spine 08/23/2015  . Hematuria 08/23/2015  . Urinary incontinence due to cognitive impairment 06/08/2015  . Alzheimer's dementia without behavioral disturbance 02/01/2014  . BPH (benign prostatic hyperplasia) 02/01/2014  . Right hydrocele 02/01/2014  . Memory disturbance 04/14/2013  . Vitamin D deficiency 04/14/2013  . Elevated serum creatinine 04/14/2013  . Hyperlipemia 04/14/2013  . Hypothyroid 04/14/2013  . Fatigue 04/14/2013  . Leg weakness, bilateral 04/14/2013  . Other symptoms involving cardiovascular system 04/23/2012   Outpatient Encounter  Prescriptions as of 11/21/2015  Medication Sig  . Cholecalciferol (VITAMIN D3) 2000 UNITS capsule Take 2,000 Units by mouth daily.  Marland Kitchen. donepezil (ARICEPT) 5 MG tablet Take 1 tablet (5 mg total) by mouth at bedtime. For 8 weeks then increase to 10mg  daily  . feeding supplement, ENSURE ENLIVE, (ENSURE ENLIVE) LIQD Take 237 mLs by mouth 3 (three) times daily between meals.  . ferrous sulfate 325 (65 FE) MG tablet Take 1 tablet (325 mg total) by mouth 2 (two) times daily with a meal.  . gabapentin (NEURONTIN) 100 MG capsule Take 2 capsules (200 mg total) by mouth at bedtime.  Marland Kitchen. levothyroxine (SYNTHROID, LEVOTHROID) 75 MCG tablet Take 1 tablet (75 mcg total) by mouth daily.  . memantine (NAMENDA) 10 MG tablet Take 1 tablet (10 mg total) by mouth 2 (two) times daily.  Marland Kitchen. senna (SENOKOT) 8.6 MG TABS tablet Take 1 tablet (8.6 mg total) by mouth at bedtime.  . [DISCONTINUED] acetaminophen (TYLENOL) 325 MG tablet Take 2 tablets (650 mg total) by mouth every 6 (six) hours as needed for mild pain (or Fever >/= 101).  . [DISCONTINUED] HYDROcodone-acetaminophen (NORCO/VICODIN) 5-325 MG tablet Take 1 tablet by mouth every 6 (six) hours as needed for moderate pain.  . [DISCONTINUED] polyethylene glycol (MIRALAX / GLYCOLAX) packet Take 17 g by mouth daily.  Marland Kitchen. donepezil (ARICEPT) 10 MG tablet Take 1 tablet (10 mg total) by mouth at bedtime. Do not start until march 2017 (Patient not taking: Reported on 11/21/2015)  . Memantine HCl ER (NAMENDA XR  TITRATION PACK) 7 & 14 & 21 &28 MG CP24 Take per package directions (Patient not taking: Reported on 11/21/2015)  . NAMENDA XR 28 MG CP24 24 hr capsule TAKE (1) CAPSULE DAILY (Patient not taking: Reported on 11/21/2015)   No facility-administered encounter medications on file as of 11/21/2015.      Review of Systems  Constitutional: Negative.   HENT: Negative.   Eyes: Negative.   Respiratory: Negative.   Cardiovascular: Negative.   Gastrointestinal: Negative.   Endocrine:  Negative.   Genitourinary: Negative.   Musculoskeletal: Negative.   Skin: Negative.        Left foot ulcer and sore to left hip   Allergic/Immunologic: Negative.   Neurological: Negative.   Hematological: Negative.   Psychiatric/Behavioral: Negative.        Objective:   Physical Exam  Constitutional: He is oriented to person, place, and time. No distress.  Elderly and somewhat frail but smiling  HENT:  Head: Normocephalic and atraumatic.  Right Ear: External ear normal.  Left Ear: External ear normal.  Eyes: Conjunctivae and EOM are normal. Pupils are equal, round, and reactive to light. Right eye exhibits no discharge. Left eye exhibits no discharge. No scleral icterus.  Neck: Normal range of motion. Neck supple. No thyromegaly present.  Cardiovascular: Normal rate, regular rhythm and normal heart sounds.   No murmur heard. At 72/m  Pulmonary/Chest: Effort normal and breath sounds normal. No respiratory distress. He has no wheezes. He has no rales.  Clear anteriorly and posteriorly  Abdominal: Soft. Bowel sounds are normal. He exhibits no mass. There is tenderness. There is no rebound and no guarding.  There was slight tenderness in the right lower quadrant. No apparent masses.  Musculoskeletal: He exhibits no edema or tenderness.  The patient was unable to stand without assistance. He did have good mobility of his extremities.  Lymphadenopathy:    He has no cervical adenopathy.  Neurological: He is alert and oriented to person, place, and time.  Skin: Skin is warm and dry. No rash noted. No erythema. No pallor.  There was minimal erythema to both ulcer areas. There is a pressure sore on the right buttock which appears to be healing and another one on the left heel and the wounds were cleansed and new dressings were applied to the one on the heel. The daughter will change the dressing to the buttock when she gets home.  Psychiatric: He has a normal mood and affect. His behavior  is normal.  The patient's memory deficits continue.  Nursing note and vitals reviewed.  BP 120/73 mmHg  Pulse 104  Temp(Src) 96.8 F (36 C) (Oral)  Ht 5\' 11"  (1.803 m)  Wt 130 lb (58.968 kg)  BMI 18.14 kg/m2        Assessment & Plan:  1. Intertrochanteric fracture of left hip, closed, with routine healing, subsequent encounter -The patient was able to stand with assistance. The family feel that the physical therapy is helping him and improving his status strength. This will continue 3 times weekly.  2. Closed left hip fracture, with routine healing, subsequent encounter -He continues to recover with physical therapy at home and family support  3. Chronic kidney disease, stage III (moderate) -This will be followed up today with a repeat BMP  4. Alzheimer's dementia without behavioral disturbance -The patient's medications for his Alzheimer's have been continued  5. Gait instability -Physical therapy will continue to work with him on strengthening him so that he can walk hopefully  on his own.  Patient Instructions  Continue with physical therapy Continue with nurse visits to monitor also her on left heel and right buttock Continue to pursue help from the [place] Continue to keep the patient well-hydrated Try to work at getting him as much physically rehabilitated as possible Continue with current medications for dementia   Nyra Capes MD

## 2015-11-22 DIAGNOSIS — F0281 Dementia in other diseases classified elsewhere with behavioral disturbance: Secondary | ICD-10-CM | POA: Diagnosis not present

## 2015-11-22 DIAGNOSIS — N183 Chronic kidney disease, stage 3 (moderate): Secondary | ICD-10-CM | POA: Diagnosis not present

## 2015-11-22 DIAGNOSIS — G629 Polyneuropathy, unspecified: Secondary | ICD-10-CM | POA: Diagnosis not present

## 2015-11-22 DIAGNOSIS — L89624 Pressure ulcer of left heel, stage 4: Secondary | ICD-10-CM | POA: Diagnosis not present

## 2015-11-22 DIAGNOSIS — G309 Alzheimer's disease, unspecified: Secondary | ICD-10-CM | POA: Diagnosis not present

## 2015-11-22 DIAGNOSIS — L89213 Pressure ulcer of right hip, stage 3: Secondary | ICD-10-CM | POA: Diagnosis not present

## 2015-11-22 LAB — CBC WITH DIFFERENTIAL/PLATELET
BASOS: 0 %
Basophils Absolute: 0 10*3/uL (ref 0.0–0.2)
EOS (ABSOLUTE): 0.2 10*3/uL (ref 0.0–0.4)
Eos: 2 %
HEMATOCRIT: 43.1 % (ref 37.5–51.0)
HEMOGLOBIN: 14.2 g/dL (ref 12.6–17.7)
IMMATURE GRANS (ABS): 0 10*3/uL (ref 0.0–0.1)
IMMATURE GRANULOCYTES: 0 %
LYMPHS: 22 %
Lymphocytes Absolute: 2 10*3/uL (ref 0.7–3.1)
MCH: 31.1 pg (ref 26.6–33.0)
MCHC: 32.9 g/dL (ref 31.5–35.7)
MCV: 94 fL (ref 79–97)
Monocytes Absolute: 1.2 10*3/uL — ABNORMAL HIGH (ref 0.1–0.9)
Monocytes: 13 %
NEUTROS PCT: 63 %
Neutrophils Absolute: 5.9 10*3/uL (ref 1.4–7.0)
Platelets: 274 10*3/uL (ref 150–379)
RBC: 4.57 x10E6/uL (ref 4.14–5.80)
RDW: 13.7 % (ref 12.3–15.4)
WBC: 9.4 10*3/uL (ref 3.4–10.8)

## 2015-11-22 LAB — HEPATIC FUNCTION PANEL
ALBUMIN: 3.4 g/dL — AB (ref 3.5–4.7)
ALK PHOS: 141 IU/L — AB (ref 39–117)
ALT: 11 IU/L (ref 0–44)
AST: 13 IU/L (ref 0–40)
BILIRUBIN TOTAL: 0.2 mg/dL (ref 0.0–1.2)
BILIRUBIN, DIRECT: 0.09 mg/dL (ref 0.00–0.40)
Total Protein: 6.8 g/dL (ref 6.0–8.5)

## 2015-11-22 LAB — BMP8+EGFR
BUN / CREAT RATIO: 20 (ref 10–22)
BUN: 20 mg/dL (ref 8–27)
CHLORIDE: 104 mmol/L (ref 96–106)
CO2: 28 mmol/L (ref 18–29)
Calcium: 9.2 mg/dL (ref 8.6–10.2)
Creatinine, Ser: 0.98 mg/dL (ref 0.76–1.27)
GFR calc non Af Amer: 69 mL/min/{1.73_m2} (ref >59)
GFR, EST AFRICAN AMERICAN: 80 mL/min/{1.73_m2} (ref >59)
GLUCOSE: 100 mg/dL — AB (ref 65–99)
Potassium: 4.2 mmol/L (ref 3.5–5.2)
SODIUM: 146 mmol/L — AB (ref 134–144)

## 2015-11-23 DIAGNOSIS — N183 Chronic kidney disease, stage 3 (moderate): Secondary | ICD-10-CM | POA: Diagnosis not present

## 2015-11-23 DIAGNOSIS — G629 Polyneuropathy, unspecified: Secondary | ICD-10-CM | POA: Diagnosis not present

## 2015-11-23 DIAGNOSIS — L89213 Pressure ulcer of right hip, stage 3: Secondary | ICD-10-CM | POA: Diagnosis not present

## 2015-11-23 DIAGNOSIS — L89624 Pressure ulcer of left heel, stage 4: Secondary | ICD-10-CM | POA: Diagnosis not present

## 2015-11-23 DIAGNOSIS — G309 Alzheimer's disease, unspecified: Secondary | ICD-10-CM | POA: Diagnosis not present

## 2015-11-23 DIAGNOSIS — F0281 Dementia in other diseases classified elsewhere with behavioral disturbance: Secondary | ICD-10-CM | POA: Diagnosis not present

## 2015-11-24 DIAGNOSIS — G309 Alzheimer's disease, unspecified: Secondary | ICD-10-CM | POA: Diagnosis not present

## 2015-11-24 DIAGNOSIS — L89213 Pressure ulcer of right hip, stage 3: Secondary | ICD-10-CM | POA: Diagnosis not present

## 2015-11-24 DIAGNOSIS — F0281 Dementia in other diseases classified elsewhere with behavioral disturbance: Secondary | ICD-10-CM | POA: Diagnosis not present

## 2015-11-24 DIAGNOSIS — N183 Chronic kidney disease, stage 3 (moderate): Secondary | ICD-10-CM | POA: Diagnosis not present

## 2015-11-24 DIAGNOSIS — L89624 Pressure ulcer of left heel, stage 4: Secondary | ICD-10-CM | POA: Diagnosis not present

## 2015-11-24 DIAGNOSIS — G629 Polyneuropathy, unspecified: Secondary | ICD-10-CM | POA: Diagnosis not present

## 2015-11-27 DIAGNOSIS — G309 Alzheimer's disease, unspecified: Secondary | ICD-10-CM | POA: Diagnosis not present

## 2015-11-27 DIAGNOSIS — N183 Chronic kidney disease, stage 3 (moderate): Secondary | ICD-10-CM | POA: Diagnosis not present

## 2015-11-27 DIAGNOSIS — F0281 Dementia in other diseases classified elsewhere with behavioral disturbance: Secondary | ICD-10-CM | POA: Diagnosis not present

## 2015-11-27 DIAGNOSIS — L89213 Pressure ulcer of right hip, stage 3: Secondary | ICD-10-CM | POA: Diagnosis not present

## 2015-11-27 DIAGNOSIS — L89624 Pressure ulcer of left heel, stage 4: Secondary | ICD-10-CM | POA: Diagnosis not present

## 2015-11-27 DIAGNOSIS — G629 Polyneuropathy, unspecified: Secondary | ICD-10-CM | POA: Diagnosis not present

## 2015-11-28 DIAGNOSIS — N183 Chronic kidney disease, stage 3 (moderate): Secondary | ICD-10-CM | POA: Diagnosis not present

## 2015-11-28 DIAGNOSIS — L89624 Pressure ulcer of left heel, stage 4: Secondary | ICD-10-CM | POA: Diagnosis not present

## 2015-11-28 DIAGNOSIS — G629 Polyneuropathy, unspecified: Secondary | ICD-10-CM | POA: Diagnosis not present

## 2015-11-28 DIAGNOSIS — L89213 Pressure ulcer of right hip, stage 3: Secondary | ICD-10-CM | POA: Diagnosis not present

## 2015-11-28 DIAGNOSIS — G309 Alzheimer's disease, unspecified: Secondary | ICD-10-CM | POA: Diagnosis not present

## 2015-11-28 DIAGNOSIS — F0281 Dementia in other diseases classified elsewhere with behavioral disturbance: Secondary | ICD-10-CM | POA: Diagnosis not present

## 2015-11-29 DIAGNOSIS — F0281 Dementia in other diseases classified elsewhere with behavioral disturbance: Secondary | ICD-10-CM | POA: Diagnosis not present

## 2015-11-29 DIAGNOSIS — L89213 Pressure ulcer of right hip, stage 3: Secondary | ICD-10-CM | POA: Diagnosis not present

## 2015-11-29 DIAGNOSIS — L89624 Pressure ulcer of left heel, stage 4: Secondary | ICD-10-CM | POA: Diagnosis not present

## 2015-11-29 DIAGNOSIS — G629 Polyneuropathy, unspecified: Secondary | ICD-10-CM | POA: Diagnosis not present

## 2015-11-29 DIAGNOSIS — N183 Chronic kidney disease, stage 3 (moderate): Secondary | ICD-10-CM | POA: Diagnosis not present

## 2015-11-29 DIAGNOSIS — G309 Alzheimer's disease, unspecified: Secondary | ICD-10-CM | POA: Diagnosis not present

## 2015-11-30 DIAGNOSIS — G629 Polyneuropathy, unspecified: Secondary | ICD-10-CM | POA: Diagnosis not present

## 2015-11-30 DIAGNOSIS — F0281 Dementia in other diseases classified elsewhere with behavioral disturbance: Secondary | ICD-10-CM | POA: Diagnosis not present

## 2015-11-30 DIAGNOSIS — G309 Alzheimer's disease, unspecified: Secondary | ICD-10-CM | POA: Diagnosis not present

## 2015-11-30 DIAGNOSIS — N183 Chronic kidney disease, stage 3 (moderate): Secondary | ICD-10-CM | POA: Diagnosis not present

## 2015-11-30 DIAGNOSIS — L89213 Pressure ulcer of right hip, stage 3: Secondary | ICD-10-CM | POA: Diagnosis not present

## 2015-11-30 DIAGNOSIS — L89624 Pressure ulcer of left heel, stage 4: Secondary | ICD-10-CM | POA: Diagnosis not present

## 2015-12-01 DIAGNOSIS — G629 Polyneuropathy, unspecified: Secondary | ICD-10-CM | POA: Diagnosis not present

## 2015-12-01 DIAGNOSIS — F0281 Dementia in other diseases classified elsewhere with behavioral disturbance: Secondary | ICD-10-CM | POA: Diagnosis not present

## 2015-12-01 DIAGNOSIS — L89624 Pressure ulcer of left heel, stage 4: Secondary | ICD-10-CM | POA: Diagnosis not present

## 2015-12-01 DIAGNOSIS — G309 Alzheimer's disease, unspecified: Secondary | ICD-10-CM | POA: Diagnosis not present

## 2015-12-01 DIAGNOSIS — N183 Chronic kidney disease, stage 3 (moderate): Secondary | ICD-10-CM | POA: Diagnosis not present

## 2015-12-01 DIAGNOSIS — L89213 Pressure ulcer of right hip, stage 3: Secondary | ICD-10-CM | POA: Diagnosis not present

## 2015-12-02 DIAGNOSIS — N183 Chronic kidney disease, stage 3 (moderate): Secondary | ICD-10-CM | POA: Diagnosis not present

## 2015-12-02 DIAGNOSIS — G309 Alzheimer's disease, unspecified: Secondary | ICD-10-CM | POA: Diagnosis not present

## 2015-12-02 DIAGNOSIS — F0281 Dementia in other diseases classified elsewhere with behavioral disturbance: Secondary | ICD-10-CM | POA: Diagnosis not present

## 2015-12-02 DIAGNOSIS — L89624 Pressure ulcer of left heel, stage 4: Secondary | ICD-10-CM | POA: Diagnosis not present

## 2015-12-02 DIAGNOSIS — G629 Polyneuropathy, unspecified: Secondary | ICD-10-CM | POA: Diagnosis not present

## 2015-12-02 DIAGNOSIS — L89213 Pressure ulcer of right hip, stage 3: Secondary | ICD-10-CM | POA: Diagnosis not present

## 2015-12-04 DIAGNOSIS — L89624 Pressure ulcer of left heel, stage 4: Secondary | ICD-10-CM | POA: Diagnosis not present

## 2015-12-04 DIAGNOSIS — G309 Alzheimer's disease, unspecified: Secondary | ICD-10-CM | POA: Diagnosis not present

## 2015-12-04 DIAGNOSIS — G629 Polyneuropathy, unspecified: Secondary | ICD-10-CM | POA: Diagnosis not present

## 2015-12-04 DIAGNOSIS — F0281 Dementia in other diseases classified elsewhere with behavioral disturbance: Secondary | ICD-10-CM | POA: Diagnosis not present

## 2015-12-04 DIAGNOSIS — N183 Chronic kidney disease, stage 3 (moderate): Secondary | ICD-10-CM | POA: Diagnosis not present

## 2015-12-04 DIAGNOSIS — L89213 Pressure ulcer of right hip, stage 3: Secondary | ICD-10-CM | POA: Diagnosis not present

## 2015-12-06 DIAGNOSIS — L89213 Pressure ulcer of right hip, stage 3: Secondary | ICD-10-CM | POA: Diagnosis not present

## 2015-12-06 DIAGNOSIS — F0281 Dementia in other diseases classified elsewhere with behavioral disturbance: Secondary | ICD-10-CM | POA: Diagnosis not present

## 2015-12-06 DIAGNOSIS — G629 Polyneuropathy, unspecified: Secondary | ICD-10-CM | POA: Diagnosis not present

## 2015-12-06 DIAGNOSIS — N183 Chronic kidney disease, stage 3 (moderate): Secondary | ICD-10-CM | POA: Diagnosis not present

## 2015-12-06 DIAGNOSIS — G309 Alzheimer's disease, unspecified: Secondary | ICD-10-CM | POA: Diagnosis not present

## 2015-12-06 DIAGNOSIS — L89624 Pressure ulcer of left heel, stage 4: Secondary | ICD-10-CM | POA: Diagnosis not present

## 2015-12-07 DIAGNOSIS — G629 Polyneuropathy, unspecified: Secondary | ICD-10-CM | POA: Diagnosis not present

## 2015-12-07 DIAGNOSIS — L89213 Pressure ulcer of right hip, stage 3: Secondary | ICD-10-CM | POA: Diagnosis not present

## 2015-12-07 DIAGNOSIS — L89624 Pressure ulcer of left heel, stage 4: Secondary | ICD-10-CM | POA: Diagnosis not present

## 2015-12-07 DIAGNOSIS — N183 Chronic kidney disease, stage 3 (moderate): Secondary | ICD-10-CM | POA: Diagnosis not present

## 2015-12-07 DIAGNOSIS — F0281 Dementia in other diseases classified elsewhere with behavioral disturbance: Secondary | ICD-10-CM | POA: Diagnosis not present

## 2015-12-07 DIAGNOSIS — G309 Alzheimer's disease, unspecified: Secondary | ICD-10-CM | POA: Diagnosis not present

## 2015-12-08 DIAGNOSIS — G629 Polyneuropathy, unspecified: Secondary | ICD-10-CM | POA: Diagnosis not present

## 2015-12-08 DIAGNOSIS — L89213 Pressure ulcer of right hip, stage 3: Secondary | ICD-10-CM | POA: Diagnosis not present

## 2015-12-08 DIAGNOSIS — G309 Alzheimer's disease, unspecified: Secondary | ICD-10-CM | POA: Diagnosis not present

## 2015-12-08 DIAGNOSIS — F0281 Dementia in other diseases classified elsewhere with behavioral disturbance: Secondary | ICD-10-CM | POA: Diagnosis not present

## 2015-12-08 DIAGNOSIS — N183 Chronic kidney disease, stage 3 (moderate): Secondary | ICD-10-CM | POA: Diagnosis not present

## 2015-12-08 DIAGNOSIS — L89624 Pressure ulcer of left heel, stage 4: Secondary | ICD-10-CM | POA: Diagnosis not present

## 2015-12-12 DIAGNOSIS — G629 Polyneuropathy, unspecified: Secondary | ICD-10-CM | POA: Diagnosis not present

## 2015-12-12 DIAGNOSIS — N183 Chronic kidney disease, stage 3 (moderate): Secondary | ICD-10-CM | POA: Diagnosis not present

## 2015-12-12 DIAGNOSIS — L89213 Pressure ulcer of right hip, stage 3: Secondary | ICD-10-CM | POA: Diagnosis not present

## 2015-12-12 DIAGNOSIS — F0281 Dementia in other diseases classified elsewhere with behavioral disturbance: Secondary | ICD-10-CM | POA: Diagnosis not present

## 2015-12-12 DIAGNOSIS — G309 Alzheimer's disease, unspecified: Secondary | ICD-10-CM | POA: Diagnosis not present

## 2015-12-12 DIAGNOSIS — L89624 Pressure ulcer of left heel, stage 4: Secondary | ICD-10-CM | POA: Diagnosis not present

## 2015-12-13 ENCOUNTER — Telehealth: Payer: Self-pay | Admitting: *Deleted

## 2015-12-13 ENCOUNTER — Telehealth: Payer: Self-pay

## 2015-12-13 ENCOUNTER — Other Ambulatory Visit: Payer: Self-pay | Admitting: *Deleted

## 2015-12-13 DIAGNOSIS — L89213 Pressure ulcer of right hip, stage 3: Secondary | ICD-10-CM | POA: Diagnosis not present

## 2015-12-13 DIAGNOSIS — F0281 Dementia in other diseases classified elsewhere with behavioral disturbance: Secondary | ICD-10-CM | POA: Diagnosis not present

## 2015-12-13 DIAGNOSIS — G629 Polyneuropathy, unspecified: Secondary | ICD-10-CM | POA: Diagnosis not present

## 2015-12-13 DIAGNOSIS — N183 Chronic kidney disease, stage 3 (moderate): Secondary | ICD-10-CM | POA: Diagnosis not present

## 2015-12-13 DIAGNOSIS — G309 Alzheimer's disease, unspecified: Secondary | ICD-10-CM | POA: Diagnosis not present

## 2015-12-13 DIAGNOSIS — L89624 Pressure ulcer of left heel, stage 4: Secondary | ICD-10-CM | POA: Diagnosis not present

## 2015-12-13 MED ORDER — OMEPRAZOLE 20 MG PO CPDR: 20.0000 mg | DELAYED_RELEASE_CAPSULE | Freq: Every day | ORAL | Status: DC

## 2015-12-13 NOTE — Telephone Encounter (Signed)
Joseph [name] with [facility] called requesting Verbal orders to continue PT and Bathing assistance. Informed nurse she needed to call patient's Primary Provider.

## 2015-12-13 NOTE — Telephone Encounter (Signed)
Daughter aware.

## 2015-12-13 NOTE — Telephone Encounter (Signed)
Continue Prilosec as doing and the nursing facility

## 2015-12-13 NOTE — Telephone Encounter (Signed)
Daughter coming in about Joseph [name] Rx for Prilosec given in the hospital.  Not on our EPIC list

## 2015-12-14 DIAGNOSIS — L89213 Pressure ulcer of right hip, stage 3: Secondary | ICD-10-CM | POA: Diagnosis not present

## 2015-12-14 DIAGNOSIS — G309 Alzheimer's disease, unspecified: Secondary | ICD-10-CM | POA: Diagnosis not present

## 2015-12-14 DIAGNOSIS — G629 Polyneuropathy, unspecified: Secondary | ICD-10-CM | POA: Diagnosis not present

## 2015-12-14 DIAGNOSIS — F0281 Dementia in other diseases classified elsewhere with behavioral disturbance: Secondary | ICD-10-CM | POA: Diagnosis not present

## 2015-12-14 DIAGNOSIS — L89624 Pressure ulcer of left heel, stage 4: Secondary | ICD-10-CM | POA: Diagnosis not present

## 2015-12-14 DIAGNOSIS — N183 Chronic kidney disease, stage 3 (moderate): Secondary | ICD-10-CM | POA: Diagnosis not present

## 2015-12-18 DIAGNOSIS — L89213 Pressure ulcer of right hip, stage 3: Secondary | ICD-10-CM | POA: Diagnosis not present

## 2015-12-18 DIAGNOSIS — F0281 Dementia in other diseases classified elsewhere with behavioral disturbance: Secondary | ICD-10-CM | POA: Diagnosis not present

## 2015-12-18 DIAGNOSIS — G629 Polyneuropathy, unspecified: Secondary | ICD-10-CM | POA: Diagnosis not present

## 2015-12-18 DIAGNOSIS — L89624 Pressure ulcer of left heel, stage 4: Secondary | ICD-10-CM | POA: Diagnosis not present

## 2015-12-18 DIAGNOSIS — N183 Chronic kidney disease, stage 3 (moderate): Secondary | ICD-10-CM | POA: Diagnosis not present

## 2015-12-18 DIAGNOSIS — G309 Alzheimer's disease, unspecified: Secondary | ICD-10-CM | POA: Diagnosis not present

## 2015-12-19 DIAGNOSIS — G629 Polyneuropathy, unspecified: Secondary | ICD-10-CM | POA: Diagnosis not present

## 2015-12-19 DIAGNOSIS — F0281 Dementia in other diseases classified elsewhere with behavioral disturbance: Secondary | ICD-10-CM | POA: Diagnosis not present

## 2015-12-19 DIAGNOSIS — G309 Alzheimer's disease, unspecified: Secondary | ICD-10-CM | POA: Diagnosis not present

## 2015-12-19 DIAGNOSIS — L89213 Pressure ulcer of right hip, stage 3: Secondary | ICD-10-CM | POA: Diagnosis not present

## 2015-12-19 DIAGNOSIS — L89624 Pressure ulcer of left heel, stage 4: Secondary | ICD-10-CM | POA: Diagnosis not present

## 2015-12-19 DIAGNOSIS — N183 Chronic kidney disease, stage 3 (moderate): Secondary | ICD-10-CM | POA: Diagnosis not present

## 2015-12-21 DIAGNOSIS — N183 Chronic kidney disease, stage 3 (moderate): Secondary | ICD-10-CM | POA: Diagnosis not present

## 2015-12-21 DIAGNOSIS — G629 Polyneuropathy, unspecified: Secondary | ICD-10-CM | POA: Diagnosis not present

## 2015-12-21 DIAGNOSIS — L89624 Pressure ulcer of left heel, stage 4: Secondary | ICD-10-CM | POA: Diagnosis not present

## 2015-12-21 DIAGNOSIS — G309 Alzheimer's disease, unspecified: Secondary | ICD-10-CM | POA: Diagnosis not present

## 2015-12-21 DIAGNOSIS — F0281 Dementia in other diseases classified elsewhere with behavioral disturbance: Secondary | ICD-10-CM | POA: Diagnosis not present

## 2015-12-21 DIAGNOSIS — L89213 Pressure ulcer of right hip, stage 3: Secondary | ICD-10-CM | POA: Diagnosis not present

## 2015-12-25 ENCOUNTER — Telehealth: Payer: Self-pay | Admitting: *Deleted

## 2015-12-25 DIAGNOSIS — G309 Alzheimer's disease, unspecified: Secondary | ICD-10-CM | POA: Diagnosis not present

## 2015-12-25 DIAGNOSIS — G629 Polyneuropathy, unspecified: Secondary | ICD-10-CM | POA: Diagnosis not present

## 2015-12-25 DIAGNOSIS — L89213 Pressure ulcer of right hip, stage 3: Secondary | ICD-10-CM | POA: Diagnosis not present

## 2015-12-25 DIAGNOSIS — N183 Chronic kidney disease, stage 3 (moderate): Secondary | ICD-10-CM | POA: Diagnosis not present

## 2015-12-25 DIAGNOSIS — F0281 Dementia in other diseases classified elsewhere with behavioral disturbance: Secondary | ICD-10-CM | POA: Diagnosis not present

## 2015-12-25 DIAGNOSIS — L89624 Pressure ulcer of left heel, stage 4: Secondary | ICD-10-CM | POA: Diagnosis not present

## 2015-12-25 MED ORDER — CIPROFLOXACIN HCL 500 MG PO TABS: 500.0000 mg | ORAL_TABLET | Freq: Two times a day (BID) | ORAL | Status: DC

## 2015-12-25 NOTE — Telephone Encounter (Signed)
Left heel wound looks infected - needs culture and antibiotic sent - approved by [facility] -jhb

## 2015-12-26 DIAGNOSIS — F0281 Dementia in other diseases classified elsewhere with behavioral disturbance: Secondary | ICD-10-CM | POA: Diagnosis not present

## 2015-12-26 DIAGNOSIS — L89624 Pressure ulcer of left heel, stage 4: Secondary | ICD-10-CM | POA: Diagnosis not present

## 2015-12-26 DIAGNOSIS — N183 Chronic kidney disease, stage 3 (moderate): Secondary | ICD-10-CM | POA: Diagnosis not present

## 2015-12-26 DIAGNOSIS — L89213 Pressure ulcer of right hip, stage 3: Secondary | ICD-10-CM | POA: Diagnosis not present

## 2015-12-26 DIAGNOSIS — G309 Alzheimer's disease, unspecified: Secondary | ICD-10-CM | POA: Diagnosis not present

## 2015-12-26 DIAGNOSIS — G629 Polyneuropathy, unspecified: Secondary | ICD-10-CM | POA: Diagnosis not present

## 2015-12-27 DIAGNOSIS — G629 Polyneuropathy, unspecified: Secondary | ICD-10-CM | POA: Diagnosis not present

## 2015-12-27 DIAGNOSIS — G309 Alzheimer's disease, unspecified: Secondary | ICD-10-CM | POA: Diagnosis not present

## 2015-12-27 DIAGNOSIS — L89624 Pressure ulcer of left heel, stage 4: Secondary | ICD-10-CM | POA: Diagnosis not present

## 2015-12-27 DIAGNOSIS — N183 Chronic kidney disease, stage 3 (moderate): Secondary | ICD-10-CM | POA: Diagnosis not present

## 2015-12-27 DIAGNOSIS — F0281 Dementia in other diseases classified elsewhere with behavioral disturbance: Secondary | ICD-10-CM | POA: Diagnosis not present

## 2015-12-27 DIAGNOSIS — L89213 Pressure ulcer of right hip, stage 3: Secondary | ICD-10-CM | POA: Diagnosis not present

## 2015-12-28 DIAGNOSIS — F0281 Dementia in other diseases classified elsewhere with behavioral disturbance: Secondary | ICD-10-CM | POA: Diagnosis not present

## 2015-12-28 DIAGNOSIS — L89213 Pressure ulcer of right hip, stage 3: Secondary | ICD-10-CM | POA: Diagnosis not present

## 2015-12-28 DIAGNOSIS — G309 Alzheimer's disease, unspecified: Secondary | ICD-10-CM | POA: Diagnosis not present

## 2015-12-28 DIAGNOSIS — L89624 Pressure ulcer of left heel, stage 4: Secondary | ICD-10-CM | POA: Diagnosis not present

## 2015-12-28 DIAGNOSIS — N183 Chronic kidney disease, stage 3 (moderate): Secondary | ICD-10-CM | POA: Diagnosis not present

## 2015-12-28 DIAGNOSIS — G629 Polyneuropathy, unspecified: Secondary | ICD-10-CM | POA: Diagnosis not present

## 2016-01-02 DIAGNOSIS — F0281 Dementia in other diseases classified elsewhere with behavioral disturbance: Secondary | ICD-10-CM | POA: Diagnosis not present

## 2016-01-02 DIAGNOSIS — L89624 Pressure ulcer of left heel, stage 4: Secondary | ICD-10-CM | POA: Diagnosis not present

## 2016-01-02 DIAGNOSIS — G629 Polyneuropathy, unspecified: Secondary | ICD-10-CM | POA: Diagnosis not present

## 2016-01-02 DIAGNOSIS — N183 Chronic kidney disease, stage 3 (moderate): Secondary | ICD-10-CM | POA: Diagnosis not present

## 2016-01-02 DIAGNOSIS — G309 Alzheimer's disease, unspecified: Secondary | ICD-10-CM | POA: Diagnosis not present

## 2016-01-02 DIAGNOSIS — L89213 Pressure ulcer of right hip, stage 3: Secondary | ICD-10-CM | POA: Diagnosis not present

## 2016-01-04 ENCOUNTER — Encounter: Payer: Self-pay | Admitting: Family Medicine

## 2016-01-04 ENCOUNTER — Telehealth: Payer: Self-pay | Admitting: *Deleted

## 2016-01-04 ENCOUNTER — Ambulatory Visit (INDEPENDENT_AMBULATORY_CARE_PROVIDER_SITE_OTHER): Payer: Medicare Other | Admitting: Family Medicine

## 2016-01-04 VITALS — BP 111/65 | HR 84 | Ht 71.0 in | Wt 129.0 lb

## 2016-01-04 DIAGNOSIS — F0281 Dementia in other diseases classified elsewhere with behavioral disturbance: Secondary | ICD-10-CM | POA: Diagnosis not present

## 2016-01-04 DIAGNOSIS — L89213 Pressure ulcer of right hip, stage 3: Secondary | ICD-10-CM | POA: Diagnosis not present

## 2016-01-04 DIAGNOSIS — G309 Alzheimer's disease, unspecified: Secondary | ICD-10-CM

## 2016-01-04 DIAGNOSIS — E034 Atrophy of thyroid (acquired): Secondary | ICD-10-CM | POA: Diagnosis not present

## 2016-01-04 DIAGNOSIS — L89211 Pressure ulcer of right hip, stage 1: Secondary | ICD-10-CM

## 2016-01-04 DIAGNOSIS — F0280 Dementia in other diseases classified elsewhere without behavioral disturbance: Secondary | ICD-10-CM

## 2016-01-04 DIAGNOSIS — E038 Other specified hypothyroidism: Secondary | ICD-10-CM

## 2016-01-04 DIAGNOSIS — I739 Peripheral vascular disease, unspecified: Secondary | ICD-10-CM | POA: Diagnosis not present

## 2016-01-04 DIAGNOSIS — L89602 Pressure ulcer of unspecified heel, stage 2: Secondary | ICD-10-CM

## 2016-01-04 DIAGNOSIS — E785 Hyperlipidemia, unspecified: Secondary | ICD-10-CM

## 2016-01-04 DIAGNOSIS — E559 Vitamin D deficiency, unspecified: Secondary | ICD-10-CM | POA: Diagnosis not present

## 2016-01-04 DIAGNOSIS — N183 Chronic kidney disease, stage 3 unspecified: Secondary | ICD-10-CM

## 2016-01-04 DIAGNOSIS — R413 Other amnesia: Secondary | ICD-10-CM

## 2016-01-04 DIAGNOSIS — L89624 Pressure ulcer of left heel, stage 4: Secondary | ICD-10-CM | POA: Diagnosis not present

## 2016-01-04 DIAGNOSIS — L97519 Non-pressure chronic ulcer of other part of right foot with unspecified severity: Secondary | ICD-10-CM

## 2016-01-04 DIAGNOSIS — R3981 Functional urinary incontinence: Secondary | ICD-10-CM

## 2016-01-04 DIAGNOSIS — G629 Polyneuropathy, unspecified: Secondary | ICD-10-CM | POA: Diagnosis not present

## 2016-01-04 MED ORDER — GABAPENTIN 100 MG PO CAPS
200.0000 mg | ORAL_CAPSULE | Freq: Every day | ORAL | Status: DC
Start: 2016-01-04 — End: 2016-01-29

## 2016-01-04 NOTE — Progress Notes (Signed)
Subjective:    Patient ID: Joseph [name], male    DOB: 07-29-1926, 82 y.o.   MRN: [id number]  HPI  Patient is here today for a 6 week recheck on hip and heel wound, memory and home health. The physical therapy was discontinued or ended today. The family is concerned about a sore on his heel and hip. They're requesting a refill on gabapentin. He was last seen 6 weeks ago. The home health nurse comes in weekly. The assistant comes in and attends and helps the patient with bathing. Physical therapy has been discontinued. The patient has been on Cipro for the sores and in combination with the dressings the family say the redness is improved and getting better. We will do a culture and sensitivity on the heel sore and we'll withhold any antibiotic treatment pending results of this culture and follow-up visits by the home health nurse. The patient is able to stand and move with assistance at home. He is eating well. He is able to get out of the bed sometimes without him knowing it he's been found on the floor sleeping and found in the chair beside the bed at times. He does not appear to be agitated.   Review of Systems  Constitutional: Negative.   HENT: Negative.   Eyes: Negative.   Respiratory: Negative.   Cardiovascular: Negative.   Gastrointestinal: Negative.   Endocrine: Negative.   Genitourinary: Negative.   Musculoskeletal: Negative.   Skin: Positive for wound.       Hip and heel  Allergic/Immunologic: Negative.   Neurological: Negative.   Hematological: Negative.   Psychiatric/Behavioral: Negative.          Patient Active Problem List   Diagnosis Date Noted  . Syncope 08/29/2015  . Elevated d-dimer 08/29/2015  . Closed left hip fracture ([place])   . Hip fracture ([place]) 08/23/2015  . Intertrochanteric fracture of left hip ([place]) 08/23/2015  . Chronic kidney disease, stage III (moderate) 08/23/2015  . Lower extremity neuropathy 08/23/2015  . Leukocytosis 08/23/2015  . Arthropathy of  cervical spine 08/23/2015  . Hematuria 08/23/2015  . Urinary incontinence due to cognitive impairment 06/08/2015  . Alzheimer's dementia without behavioral disturbance 02/01/2014  . BPH (benign prostatic hyperplasia) 02/01/2014  . Right hydrocele 02/01/2014  . Memory disturbance 04/14/2013  . Vitamin D deficiency 04/14/2013  . Elevated serum creatinine 04/14/2013  . Hyperlipemia 04/14/2013  . Hypothyroid 04/14/2013  . Fatigue 04/14/2013  . Leg weakness, bilateral 04/14/2013  . Other symptoms involving cardiovascular system 04/23/2012   Outpatient Encounter Prescriptions as of 01/04/2016  Medication Sig  . Cholecalciferol (VITAMIN D3) 2000 UNITS capsule Take 2,000 Units by mouth daily.  Marland Kitchen donepezil (ARICEPT) 5 MG tablet Take 1 tablet (5 mg total) by mouth at bedtime. For 8 weeks then increase to 9m daily  . feeding supplement, ENSURE ENLIVE, (ENSURE ENLIVE) LIQD Take 237 mLs by mouth 3 (three) times daily between meals.  . ferrous sulfate 325 (65 FE) MG tablet Take 1 tablet (325 mg total) by mouth 2 (two) times daily with a meal.  . gabapentin (NEURONTIN) 100 MG capsule Take 2 capsules (200 mg total) by mouth at bedtime.  .Marland Kitchenlevothyroxine (SYNTHROID, LEVOTHROID) 75 MCG tablet Take 1 tablet (75 mcg total) by mouth daily.  .Marland Kitchenomeprazole (PRILOSEC) 20 MG capsule Take 1 capsule (20 mg total) by mouth daily.  .Marland Kitchensenna (SENOKOT) 8.6 MG TABS tablet Take 1 tablet (8.6 mg total) by mouth at bedtime.  . ciprofloxacin (CIPRO) 500 MG  tablet Take 1 tablet (500 mg total) by mouth 2 (two) times daily.  Marland Kitchen donepezil (ARICEPT) 10 MG tablet Take 1 tablet (10 mg total) by mouth at bedtime. Do not start until march 2017 (Patient not taking: Reported on 01/04/2016)  . memantine (NAMENDA) 10 MG tablet Take 1 tablet (10 mg total) by mouth 2 (two) times daily. (Patient not taking: Reported on 01/04/2016)  . Memantine HCl ER (NAMENDA XR TITRATION PACK) 7 & 14 & 21 &28 MG CP24 Take per package directions (Patient not  taking: Reported on 11/21/2015)  . NAMENDA XR 28 MG CP24 24 hr capsule TAKE (1) CAPSULE DAILY (Patient not taking: Reported on 11/21/2015)   No facility-administered encounter medications on file as of 01/04/2016.       Objective:   Physical Exam  Constitutional: He is oriented to person, place, and time. No distress.  HENT:  Head: Normocephalic and atraumatic.  Right Ear: External ear normal.  Left Ear: External ear normal.  Nose: Nose normal.  Mouth/Throat: No oropharyngeal exudate.  Unable to completely visualize throat  Eyes: Conjunctivae and EOM are normal. Pupils are equal, round, and reactive to light. Right eye exhibits no discharge. Left eye exhibits no discharge. No scleral icterus.  Neck: Normal range of motion. Neck supple. No thyromegaly present.  Cardiovascular: Normal rate, regular rhythm and normal heart sounds.   No murmur heard. There is edema in the left foot greater than the right foot and pulses are difficult to palpate in either foot.  Pulmonary/Chest: Effort normal and breath sounds normal. No respiratory distress. He has no wheezes. He has no rales. He exhibits no tenderness.  Clear anteriorly and posteriorly  Abdominal: Soft. Bowel sounds are normal. He exhibits no mass. There is no tenderness. There is no rebound and no guarding.  No abdominal tenderness noted or masses. Also been moving regularly. He does lack of control.  Musculoskeletal: He exhibits edema. He exhibits no tenderness.  Range of motion only with assistance  Lymphadenopathy:    He has no cervical adenopathy.  Neurological: He is alert and oriented to person, place, and time.  Skin: Skin is warm and dry. No rash noted.  Is a small pressure sore on the right lateral hip and there is a deeper pressure sore on the left posterior heel the right hip sore has no redness around it and the left heel sore looks as though it may be healing some but is still has some depth to this. A culture and sensitivity  was done.  Psychiatric: His behavior is normal. Thought content normal.  The patient is noncommunicative  Nursing note and vitals reviewed.  BP 111/65 mmHg  Pulse 84  Ht 5' 11"  (1.803 m)  Wt 129 lb (58.514 kg)  BMI 18.00 kg/m2        Assessment & Plan:  1. Vitamin D deficiency -Continue current treatment   2. Urinary incontinence due to cognitive impairment  3. Memory disturbance -Increase Aricept and Namenda as planned -We will have clinical pharmacy to review his meds to make sure there is nothing that he is taking that could be playing a role with causing him to be sleepy during the day  4. Hypothyroidism due to acquired atrophy of thyroid -Continue current thyroid treatment  5. Hyperlipemia -Continue good healthy diet  6. Chronic kidney disease, stage III (moderate) - BMP8+EGFR - CBC with Differential/Platelet  7. Alzheimer's dementia without behavioral disturbance -Add Namenda and Aricept as planned  8. Pressure sore of hip, right,  stage I -Continue home health visits and observation of this sore.  9. Pressure sore on heel, unspecified laterality, stage II -Continue home health visits and observation of the heel sore with daily dressings  10. Peripheral vascular insufficiency [facility])  Patient Instructions  Continue with home healthcare and make sure that nursing specks buttock sore and left heel sore weekly Continue current dressings to these areas If any increase in redness please have her to call us We will call with the culture and sensitivity report once it becomes available We will also call with the results of the CBC and BMP Finish up the antibiotic if any is left and continue with topical treatment Increase Aricept and Namenda as planned and call back next week for new prescriptions to be called and if needed We will have the clinical pharmacist to review all his medicines and make sure that what he is taking is not playing a role with causing more  sleepiness during the day.   Arrie Senate MD   .

## 2016-01-04 NOTE — Telephone Encounter (Signed)
Joseph [name], Dr.Moore wants to know if you will review patients medications to see if any of them would cause him to be real groggy all day. Patient has not started the namenda yet and is still currently on aricept . Thank you.

## 2016-01-04 NOTE — Addendum Note (Signed)
Addended by: Tamera Punt on: 01/04/2016 03:45 PM   Modules accepted: Orders

## 2016-01-04 NOTE — Addendum Note (Signed)
Addended by: Tamera Punt on: 01/04/2016 04:35 PM   Modules accepted: Orders

## 2016-01-04 NOTE — Patient Instructions (Signed)
Continue with home healthcare and make sure that nursing specks buttock sore and left heel sore weekly Continue current dressings to these areas If any increase in redness please have her to call us We will call with the culture and sensitivity report once it becomes available We will also call with the results of the CBC and BMP Finish up the antibiotic if any is left and continue with topical treatment Increase Aricept and Namenda as planned and call back next week for new prescriptions to be called and if needed We will have the clinical pharmacist to review all his medicines and make sure that what he is taking is not playing a role with causing more sleepiness during the day.

## 2016-01-04 NOTE — Telephone Encounter (Signed)
He might be having hangover effect from gabapentin.  I would recommend decrease dose to  qhs or discontinue and see if grogginess improves.

## 2016-01-04 NOTE — Telephone Encounter (Signed)
Please call Mr. Plotkin daughter [name] and tell her to decrease the gabapentin dose to 100 mg and see if this helps I will probably take this just at nighttime. Please call us back with the results in a week

## 2016-01-05 LAB — CBC WITH DIFFERENTIAL/PLATELET
BASOS: 1 %
Basophils Absolute: 0.1 10*3/uL (ref 0.0–0.2)
EOS (ABSOLUTE): 0.2 10*3/uL (ref 0.0–0.4)
EOS: 2 %
HEMATOCRIT: 40.6 % (ref 37.5–51.0)
HEMOGLOBIN: 13.2 g/dL (ref 12.6–17.7)
Immature Grans (Abs): 0 10*3/uL (ref 0.0–0.1)
Immature Granulocytes: 0 %
LYMPHS ABS: 1.8 10*3/uL (ref 0.7–3.1)
Lymphs: 21 %
MCH: 31.4 pg (ref 26.6–33.0)
MCHC: 32.5 g/dL (ref 31.5–35.7)
MCV: 97 fL (ref 79–97)
MONOCYTES: 14 %
Monocytes Absolute: 1.2 10*3/uL — ABNORMAL HIGH (ref 0.1–0.9)
NEUTROS ABS: 5.3 10*3/uL (ref 1.4–7.0)
Neutrophils: 62 %
Platelets: 253 10*3/uL (ref 150–379)
RBC: 4.2 x10E6/uL (ref 4.14–5.80)
RDW: 13.5 % (ref 12.3–15.4)
WBC: 8.6 10*3/uL (ref 3.4–10.8)

## 2016-01-05 LAB — BMP8+EGFR
BUN / CREAT RATIO: 16 (ref 10–22)
BUN: 16 mg/dL (ref 8–27)
CO2: 30 mmol/L — ABNORMAL HIGH (ref 18–29)
CREATININE: 0.98 mg/dL (ref 0.76–1.27)
Calcium: 9.1 mg/dL (ref 8.6–10.2)
Chloride: 101 mmol/L (ref 96–106)
GFR, EST AFRICAN AMERICAN: 79 mL/min/{1.73_m2} (ref >59)
GFR, EST NON AFRICAN AMERICAN: 69 mL/min/{1.73_m2} (ref >59)
Glucose: 96 mg/dL (ref 65–99)
Potassium: 4.1 mmol/L (ref 3.5–5.2)
Sodium: 141 mmol/L (ref 134–144)

## 2016-01-05 NOTE — Telephone Encounter (Signed)
Patients daughter aware and verbalizes understanding.

## 2016-01-07 DIAGNOSIS — G309 Alzheimer's disease, unspecified: Secondary | ICD-10-CM | POA: Diagnosis not present

## 2016-01-07 DIAGNOSIS — G629 Polyneuropathy, unspecified: Secondary | ICD-10-CM | POA: Diagnosis not present

## 2016-01-07 DIAGNOSIS — F0281 Dementia in other diseases classified elsewhere with behavioral disturbance: Secondary | ICD-10-CM | POA: Diagnosis not present

## 2016-01-07 DIAGNOSIS — N183 Chronic kidney disease, stage 3 (moderate): Secondary | ICD-10-CM | POA: Diagnosis not present

## 2016-01-07 DIAGNOSIS — L89213 Pressure ulcer of right hip, stage 3: Secondary | ICD-10-CM | POA: Diagnosis not present

## 2016-01-07 DIAGNOSIS — L89624 Pressure ulcer of left heel, stage 4: Secondary | ICD-10-CM | POA: Diagnosis not present

## 2016-01-08 LAB — ANAEROBIC AND AEROBIC CULTURE

## 2016-01-08 MED ORDER — AMOXICILLIN-POT CLAVULANATE 875-125 MG PO TABS: 1.0000 | ORAL_TABLET | Freq: Two times a day (BID) | ORAL | Status: DC

## 2016-01-08 NOTE — Addendum Note (Signed)
Addended by: Tamera Punt on: 01/08/2016 04:29 PM   Modules accepted: Orders

## 2016-01-10 DIAGNOSIS — S3210XA Unspecified fracture of sacrum, initial encounter for closed fracture: Secondary | ICD-10-CM

## 2016-01-10 HISTORY — DX: Unspecified fracture of sacrum, initial encounter for closed fracture: S32.10XA

## 2016-01-12 DIAGNOSIS — L89213 Pressure ulcer of right hip, stage 3: Secondary | ICD-10-CM | POA: Diagnosis not present

## 2016-01-12 DIAGNOSIS — N183 Chronic kidney disease, stage 3 (moderate): Secondary | ICD-10-CM | POA: Diagnosis not present

## 2016-01-12 DIAGNOSIS — G309 Alzheimer's disease, unspecified: Secondary | ICD-10-CM | POA: Diagnosis not present

## 2016-01-12 DIAGNOSIS — F0281 Dementia in other diseases classified elsewhere with behavioral disturbance: Secondary | ICD-10-CM | POA: Diagnosis not present

## 2016-01-12 DIAGNOSIS — L89624 Pressure ulcer of left heel, stage 4: Secondary | ICD-10-CM | POA: Diagnosis not present

## 2016-01-12 DIAGNOSIS — G629 Polyneuropathy, unspecified: Secondary | ICD-10-CM | POA: Diagnosis not present

## 2016-01-14 DIAGNOSIS — E785 Hyperlipidemia, unspecified: Secondary | ICD-10-CM | POA: Diagnosis not present

## 2016-01-14 DIAGNOSIS — G629 Polyneuropathy, unspecified: Secondary | ICD-10-CM | POA: Diagnosis not present

## 2016-01-14 DIAGNOSIS — L89213 Pressure ulcer of right hip, stage 3: Secondary | ICD-10-CM | POA: Diagnosis not present

## 2016-01-14 DIAGNOSIS — N183 Chronic kidney disease, stage 3 (moderate): Secondary | ICD-10-CM | POA: Diagnosis not present

## 2016-01-14 DIAGNOSIS — Z4800 Encounter for change or removal of nonsurgical wound dressing: Secondary | ICD-10-CM | POA: Diagnosis not present

## 2016-01-14 DIAGNOSIS — E039 Hypothyroidism, unspecified: Secondary | ICD-10-CM | POA: Diagnosis not present

## 2016-01-14 DIAGNOSIS — L89624 Pressure ulcer of left heel, stage 4: Secondary | ICD-10-CM | POA: Diagnosis not present

## 2016-01-14 DIAGNOSIS — N400 Enlarged prostate without lower urinary tract symptoms: Secondary | ICD-10-CM | POA: Diagnosis not present

## 2016-01-14 DIAGNOSIS — F0281 Dementia in other diseases classified elsewhere with behavioral disturbance: Secondary | ICD-10-CM | POA: Diagnosis not present

## 2016-01-14 DIAGNOSIS — Z9181 History of falling: Secondary | ICD-10-CM | POA: Diagnosis not present

## 2016-01-14 DIAGNOSIS — G309 Alzheimer's disease, unspecified: Secondary | ICD-10-CM | POA: Diagnosis not present

## 2016-01-16 DIAGNOSIS — G629 Polyneuropathy, unspecified: Secondary | ICD-10-CM | POA: Diagnosis not present

## 2016-01-16 DIAGNOSIS — G309 Alzheimer's disease, unspecified: Secondary | ICD-10-CM | POA: Diagnosis not present

## 2016-01-16 DIAGNOSIS — L89213 Pressure ulcer of right hip, stage 3: Secondary | ICD-10-CM | POA: Diagnosis not present

## 2016-01-16 DIAGNOSIS — N183 Chronic kidney disease, stage 3 (moderate): Secondary | ICD-10-CM | POA: Diagnosis not present

## 2016-01-16 DIAGNOSIS — F0281 Dementia in other diseases classified elsewhere with behavioral disturbance: Secondary | ICD-10-CM | POA: Diagnosis not present

## 2016-01-16 DIAGNOSIS — L89624 Pressure ulcer of left heel, stage 4: Secondary | ICD-10-CM | POA: Diagnosis not present

## 2016-01-18 DIAGNOSIS — L89213 Pressure ulcer of right hip, stage 3: Secondary | ICD-10-CM | POA: Diagnosis not present

## 2016-01-18 DIAGNOSIS — G629 Polyneuropathy, unspecified: Secondary | ICD-10-CM | POA: Diagnosis not present

## 2016-01-18 DIAGNOSIS — N183 Chronic kidney disease, stage 3 (moderate): Secondary | ICD-10-CM | POA: Diagnosis not present

## 2016-01-18 DIAGNOSIS — G309 Alzheimer's disease, unspecified: Secondary | ICD-10-CM | POA: Diagnosis not present

## 2016-01-18 DIAGNOSIS — L89624 Pressure ulcer of left heel, stage 4: Secondary | ICD-10-CM | POA: Diagnosis not present

## 2016-01-18 DIAGNOSIS — F0281 Dementia in other diseases classified elsewhere with behavioral disturbance: Secondary | ICD-10-CM | POA: Diagnosis not present

## 2016-01-19 DIAGNOSIS — G629 Polyneuropathy, unspecified: Secondary | ICD-10-CM | POA: Diagnosis not present

## 2016-01-19 DIAGNOSIS — L89213 Pressure ulcer of right hip, stage 3: Secondary | ICD-10-CM | POA: Diagnosis not present

## 2016-01-19 DIAGNOSIS — F0281 Dementia in other diseases classified elsewhere with behavioral disturbance: Secondary | ICD-10-CM | POA: Diagnosis not present

## 2016-01-19 DIAGNOSIS — G309 Alzheimer's disease, unspecified: Secondary | ICD-10-CM | POA: Diagnosis not present

## 2016-01-19 DIAGNOSIS — L89624 Pressure ulcer of left heel, stage 4: Secondary | ICD-10-CM | POA: Diagnosis not present

## 2016-01-19 DIAGNOSIS — N183 Chronic kidney disease, stage 3 (moderate): Secondary | ICD-10-CM | POA: Diagnosis not present

## 2016-01-22 DIAGNOSIS — L89624 Pressure ulcer of left heel, stage 4: Secondary | ICD-10-CM | POA: Diagnosis not present

## 2016-01-22 DIAGNOSIS — G309 Alzheimer's disease, unspecified: Secondary | ICD-10-CM | POA: Diagnosis not present

## 2016-01-22 DIAGNOSIS — F0281 Dementia in other diseases classified elsewhere with behavioral disturbance: Secondary | ICD-10-CM | POA: Diagnosis not present

## 2016-01-22 DIAGNOSIS — G629 Polyneuropathy, unspecified: Secondary | ICD-10-CM | POA: Diagnosis not present

## 2016-01-22 DIAGNOSIS — L89213 Pressure ulcer of right hip, stage 3: Secondary | ICD-10-CM | POA: Diagnosis not present

## 2016-01-22 DIAGNOSIS — N183 Chronic kidney disease, stage 3 (moderate): Secondary | ICD-10-CM | POA: Diagnosis not present

## 2016-01-23 DIAGNOSIS — G309 Alzheimer's disease, unspecified: Secondary | ICD-10-CM | POA: Diagnosis not present

## 2016-01-23 DIAGNOSIS — L89624 Pressure ulcer of left heel, stage 4: Secondary | ICD-10-CM | POA: Diagnosis not present

## 2016-01-23 DIAGNOSIS — G629 Polyneuropathy, unspecified: Secondary | ICD-10-CM | POA: Diagnosis not present

## 2016-01-23 DIAGNOSIS — N183 Chronic kidney disease, stage 3 (moderate): Secondary | ICD-10-CM | POA: Diagnosis not present

## 2016-01-23 DIAGNOSIS — F0281 Dementia in other diseases classified elsewhere with behavioral disturbance: Secondary | ICD-10-CM | POA: Diagnosis not present

## 2016-01-23 DIAGNOSIS — L89213 Pressure ulcer of right hip, stage 3: Secondary | ICD-10-CM | POA: Diagnosis not present

## 2016-01-24 ENCOUNTER — Telehealth: Payer: Self-pay | Admitting: Family Medicine

## 2016-01-24 NOTE — Telephone Encounter (Signed)
Patients daughter states that his left hand was swollen a couple days last week and then the swelling went away. She states that it started swelling again on Monday. Daughter advised that he should be seen. Appointment offered for today but daughter request an appointment for in the am. [facility]

## 2016-01-25 ENCOUNTER — Ambulatory Visit (INDEPENDENT_AMBULATORY_CARE_PROVIDER_SITE_OTHER): Payer: Medicare Other | Admitting: Pediatrics

## 2016-01-25 ENCOUNTER — Encounter: Payer: Self-pay | Admitting: Pediatrics

## 2016-01-25 VITALS — BP 95/55 | HR 80 | Temp 97.3°F | Ht 71.0 in | Wt 142.2 lb

## 2016-01-25 DIAGNOSIS — M7989 Other specified soft tissue disorders: Secondary | ICD-10-CM | POA: Diagnosis not present

## 2016-01-25 DIAGNOSIS — L89624 Pressure ulcer of left heel, stage 4: Secondary | ICD-10-CM | POA: Diagnosis not present

## 2016-01-25 DIAGNOSIS — N183 Chronic kidney disease, stage 3 (moderate): Secondary | ICD-10-CM | POA: Diagnosis not present

## 2016-01-25 DIAGNOSIS — G629 Polyneuropathy, unspecified: Secondary | ICD-10-CM | POA: Diagnosis not present

## 2016-01-25 DIAGNOSIS — L89213 Pressure ulcer of right hip, stage 3: Secondary | ICD-10-CM | POA: Diagnosis not present

## 2016-01-25 DIAGNOSIS — F0281 Dementia in other diseases classified elsewhere with behavioral disturbance: Secondary | ICD-10-CM | POA: Diagnosis not present

## 2016-01-25 DIAGNOSIS — G309 Alzheimer's disease, unspecified: Secondary | ICD-10-CM | POA: Diagnosis not present

## 2016-01-25 NOTE — Progress Notes (Signed)
    Subjective:    Patient ID: Joseph [name], male    DOB: 1925/12/02, 82 y.o.   MRN: [id number]  CC: Edema   HPI: Joseph [name] is a 82 y.o. male presenting for Edema  L hand is sometimes swollen when he wakes up in the morning Improves over the course of the day Slightly swollen now No skin breaks No redness Daughter doesn't think he has injured it, never noticed bruising Doesn't complain about pain though pt does have dementia, she isnt sure if he would complain if he did have pain     Relevant past medical, surgical, family and social history reviewed and updated as indicated. Interim medical history since our last visit reviewed. Allergies and medications reviewed and updated.    ROS: Per HPI unless specifically indicated above  History  Smoking status  . Former Smoker -- 40 years  . Types: Cigarettes, Pipe  . Quit date: 04/23/1972  Smokeless tobacco  . Former NeurosurgeonUser  . Types: Chew  . Quit date: 04/23/1972    Past Medical History Patient Active Problem List   Diagnosis Date Noted  . Syncope 08/29/2015  . Elevated d-dimer 08/29/2015  . Closed left hip fracture (HCC)   . Hip fracture (HCC) 08/23/2015  . Intertrochanteric fracture of left hip (HCC) 08/23/2015  . Chronic kidney disease, stage III (moderate) 08/23/2015  . Lower extremity neuropathy 08/23/2015  . Leukocytosis 08/23/2015  . Arthropathy of cervical spine 08/23/2015  . Hematuria 08/23/2015  . Urinary incontinence due to cognitive impairment 06/08/2015  . Alzheimer's dementia without behavioral disturbance 02/01/2014  . BPH (benign prostatic hyperplasia) 02/01/2014  . Right hydrocele 02/01/2014  . Memory disturbance 04/14/2013  . Vitamin D deficiency 04/14/2013  . Elevated serum creatinine 04/14/2013  . Hyperlipemia 04/14/2013  . Hypothyroid 04/14/2013  . Fatigue 04/14/2013  . Leg weakness, bilateral 04/14/2013  . Other symptoms involving cardiovascular system 04/23/2012        Objective:      BP 95/55 mmHg  Pulse 80  Temp(Src) 97.3 F (36.3 C) (Oral)  Ht 5\' 11"  (1.803 m)  Wt 142 lb 3.2 oz (64.501 kg)  BMI 19.84 kg/m2  Wt Readings from Last 3 Encounters:  01/25/16 142 lb 3.2 oz (64.501 kg)  01/04/16 129 lb (58.514 kg)  11/21/15 130 lb (58.968 kg)     Gen: NAD, alert, cooperative with exam, NCAT EYES: EOMI, no scleral injection or icterus CV: NRRR, normal S1/S2, no murmur Resp: CTABL, no wheezes, normal WOB Abd: +BS, soft, NTND. no guarding or organomegaly MSK: L hand slightly swollen compared with R, no redness, no point tenderness over muscles, able to move all digits without pain, no point tenderness over L UE, lower or upper part of arm, no other bruising     Assessment & Plan:    Briyan was seen today for edema of L hand, present in mornings, improves over day. Not there every day. Comes and goes. Possible that he is sleeping on it oddly, no signs of infection, rest of exam is reassuring. Daughter will continue to monitor the hand.  Diagnoses and all orders for this visit:  Swelling of left hand  Follow up plan: As needed  Rex Krasarol Vincent, MD [facility]

## 2016-01-29 ENCOUNTER — Other Ambulatory Visit: Payer: Self-pay | Admitting: Family Medicine

## 2016-01-30 DIAGNOSIS — L89213 Pressure ulcer of right hip, stage 3: Secondary | ICD-10-CM | POA: Diagnosis not present

## 2016-01-30 DIAGNOSIS — F0281 Dementia in other diseases classified elsewhere with behavioral disturbance: Secondary | ICD-10-CM | POA: Diagnosis not present

## 2016-01-30 DIAGNOSIS — G309 Alzheimer's disease, unspecified: Secondary | ICD-10-CM | POA: Diagnosis not present

## 2016-01-30 DIAGNOSIS — G629 Polyneuropathy, unspecified: Secondary | ICD-10-CM | POA: Diagnosis not present

## 2016-01-30 DIAGNOSIS — L89624 Pressure ulcer of left heel, stage 4: Secondary | ICD-10-CM | POA: Diagnosis not present

## 2016-01-30 DIAGNOSIS — N183 Chronic kidney disease, stage 3 (moderate): Secondary | ICD-10-CM | POA: Diagnosis not present

## 2016-02-01 DIAGNOSIS — L89213 Pressure ulcer of right hip, stage 3: Secondary | ICD-10-CM | POA: Diagnosis not present

## 2016-02-01 DIAGNOSIS — N183 Chronic kidney disease, stage 3 (moderate): Secondary | ICD-10-CM | POA: Diagnosis not present

## 2016-02-01 DIAGNOSIS — G309 Alzheimer's disease, unspecified: Secondary | ICD-10-CM | POA: Diagnosis not present

## 2016-02-01 DIAGNOSIS — L89624 Pressure ulcer of left heel, stage 4: Secondary | ICD-10-CM | POA: Diagnosis not present

## 2016-02-01 DIAGNOSIS — F0281 Dementia in other diseases classified elsewhere with behavioral disturbance: Secondary | ICD-10-CM | POA: Diagnosis not present

## 2016-02-01 DIAGNOSIS — G629 Polyneuropathy, unspecified: Secondary | ICD-10-CM | POA: Diagnosis not present

## 2016-02-02 DIAGNOSIS — F0281 Dementia in other diseases classified elsewhere with behavioral disturbance: Secondary | ICD-10-CM | POA: Diagnosis not present

## 2016-02-02 DIAGNOSIS — G629 Polyneuropathy, unspecified: Secondary | ICD-10-CM | POA: Diagnosis not present

## 2016-02-02 DIAGNOSIS — L89213 Pressure ulcer of right hip, stage 3: Secondary | ICD-10-CM | POA: Diagnosis not present

## 2016-02-02 DIAGNOSIS — G309 Alzheimer's disease, unspecified: Secondary | ICD-10-CM | POA: Diagnosis not present

## 2016-02-02 DIAGNOSIS — L89624 Pressure ulcer of left heel, stage 4: Secondary | ICD-10-CM | POA: Diagnosis not present

## 2016-02-02 DIAGNOSIS — N183 Chronic kidney disease, stage 3 (moderate): Secondary | ICD-10-CM | POA: Diagnosis not present

## 2016-02-06 ENCOUNTER — Encounter (HOSPITAL_COMMUNITY): Payer: Self-pay

## 2016-02-07 DIAGNOSIS — G629 Polyneuropathy, unspecified: Secondary | ICD-10-CM | POA: Diagnosis not present

## 2016-02-07 DIAGNOSIS — F0281 Dementia in other diseases classified elsewhere with behavioral disturbance: Secondary | ICD-10-CM | POA: Diagnosis not present

## 2016-02-07 DIAGNOSIS — L89213 Pressure ulcer of right hip, stage 3: Secondary | ICD-10-CM | POA: Diagnosis not present

## 2016-02-07 DIAGNOSIS — G309 Alzheimer's disease, unspecified: Secondary | ICD-10-CM | POA: Diagnosis not present

## 2016-02-07 DIAGNOSIS — N183 Chronic kidney disease, stage 3 (moderate): Secondary | ICD-10-CM | POA: Diagnosis not present

## 2016-02-07 DIAGNOSIS — L89624 Pressure ulcer of left heel, stage 4: Secondary | ICD-10-CM | POA: Diagnosis not present

## 2016-02-08 ENCOUNTER — Encounter (HOSPITAL_COMMUNITY): Payer: Self-pay | Admitting: Emergency Medicine

## 2016-02-08 ENCOUNTER — Telehealth: Payer: Self-pay | Admitting: Family Medicine

## 2016-02-08 ENCOUNTER — Emergency Department (HOSPITAL_COMMUNITY): Payer: Medicare Other

## 2016-02-08 ENCOUNTER — Inpatient Hospital Stay (HOSPITAL_COMMUNITY)
Admission: EM | Admit: 2016-02-08 | Discharge: 2016-02-13 | DRG: 551 | Disposition: A | Discharge status: Home Health | Payer: Medicare Other | Attending: Internal Medicine | Admitting: Internal Medicine

## 2016-02-08 DIAGNOSIS — R5381 Other malaise: Secondary | ICD-10-CM | POA: Diagnosis present

## 2016-02-08 DIAGNOSIS — F0280 Dementia in other diseases classified elsewhere without behavioral disturbance: Secondary | ICD-10-CM | POA: Diagnosis present

## 2016-02-08 DIAGNOSIS — R0902 Hypoxemia: Secondary | ICD-10-CM | POA: Diagnosis not present

## 2016-02-08 DIAGNOSIS — E785 Hyperlipidemia, unspecified: Secondary | ICD-10-CM | POA: Diagnosis present

## 2016-02-08 DIAGNOSIS — R58 Hemorrhage, not elsewhere classified: Secondary | ICD-10-CM | POA: Diagnosis not present

## 2016-02-08 DIAGNOSIS — R410 Disorientation, unspecified: Secondary | ICD-10-CM | POA: Diagnosis present

## 2016-02-08 DIAGNOSIS — T402X5A Adverse effect of other opioids, initial encounter: Secondary | ICD-10-CM | POA: Diagnosis not present

## 2016-02-08 DIAGNOSIS — S3993XA Unspecified injury of pelvis, initial encounter: Secondary | ICD-10-CM | POA: Diagnosis not present

## 2016-02-08 DIAGNOSIS — N183 Chronic kidney disease, stage 3 (moderate): Secondary | ICD-10-CM | POA: Diagnosis not present

## 2016-02-08 DIAGNOSIS — R7989 Other specified abnormal findings of blood chemistry: Secondary | ICD-10-CM | POA: Diagnosis present

## 2016-02-08 DIAGNOSIS — G92 Toxic encephalopathy: Secondary | ICD-10-CM | POA: Diagnosis not present

## 2016-02-08 DIAGNOSIS — S300XXA Contusion of lower back and pelvis, initial encounter: Secondary | ICD-10-CM | POA: Diagnosis present

## 2016-02-08 DIAGNOSIS — E559 Vitamin D deficiency, unspecified: Secondary | ICD-10-CM | POA: Diagnosis not present

## 2016-02-08 DIAGNOSIS — S199XXA Unspecified injury of neck, initial encounter: Secondary | ICD-10-CM | POA: Diagnosis not present

## 2016-02-08 DIAGNOSIS — R402421 Glasgow coma scale score 9-12, in the field [EMT or ambulance]: Secondary | ICD-10-CM | POA: Diagnosis not present

## 2016-02-08 DIAGNOSIS — S3210XA Unspecified fracture of sacrum, initial encounter for closed fracture: Secondary | ICD-10-CM | POA: Diagnosis not present

## 2016-02-08 DIAGNOSIS — E039 Hypothyroidism, unspecified: Secondary | ICD-10-CM | POA: Diagnosis present

## 2016-02-08 DIAGNOSIS — W19XXXA Unspecified fall, initial encounter: Secondary | ICD-10-CM | POA: Diagnosis present

## 2016-02-08 DIAGNOSIS — Z87891 Personal history of nicotine dependence: Secondary | ICD-10-CM

## 2016-02-08 DIAGNOSIS — G309 Alzheimer's disease, unspecified: Secondary | ICD-10-CM | POA: Diagnosis present

## 2016-02-08 DIAGNOSIS — R739 Hyperglycemia, unspecified: Secondary | ICD-10-CM | POA: Diagnosis not present

## 2016-02-08 DIAGNOSIS — R55 Syncope and collapse: Secondary | ICD-10-CM

## 2016-02-08 DIAGNOSIS — I248 Other forms of acute ischemic heart disease: Secondary | ICD-10-CM | POA: Diagnosis present

## 2016-02-08 DIAGNOSIS — D62 Acute posthemorrhagic anemia: Secondary | ICD-10-CM | POA: Diagnosis not present

## 2016-02-08 DIAGNOSIS — S0990XA Unspecified injury of head, initial encounter: Secondary | ICD-10-CM | POA: Diagnosis not present

## 2016-02-08 DIAGNOSIS — Z043 Encounter for examination and observation following other accident: Secondary | ICD-10-CM | POA: Diagnosis not present

## 2016-02-08 DIAGNOSIS — Y92015 Private garage of single-family (private) house as the place of occurrence of the external cause: Secondary | ICD-10-CM

## 2016-02-08 DIAGNOSIS — R079 Chest pain, unspecified: Secondary | ICD-10-CM | POA: Diagnosis present

## 2016-02-08 DIAGNOSIS — F0390 Unspecified dementia without behavioral disturbance: Secondary | ICD-10-CM | POA: Diagnosis not present

## 2016-02-08 DIAGNOSIS — E876 Hypokalemia: Secondary | ICD-10-CM | POA: Diagnosis not present

## 2016-02-08 DIAGNOSIS — R000 Tachycardia, unspecified: Secondary | ICD-10-CM | POA: Diagnosis not present

## 2016-02-08 DIAGNOSIS — R778 Other specified abnormalities of plasma proteins: Secondary | ICD-10-CM | POA: Diagnosis present

## 2016-02-08 HISTORY — DX: Displaced intertrochanteric fracture of left femur, initial encounter for closed fracture: S72.142A

## 2016-02-08 LAB — HEPATIC FUNCTION PANEL
ALK PHOS: 181 U/L — AB (ref 38–126)
ALT: 45 U/L (ref 17–63)
AST: 50 U/L — AB (ref 15–41)
Albumin: 3.4 g/dL — ABNORMAL LOW (ref 3.5–5.0)
BILIRUBIN DIRECT: 0.1 mg/dL (ref 0.1–0.5)
BILIRUBIN INDIRECT: 0.9 mg/dL (ref 0.3–0.9)
Total Bilirubin: 1 mg/dL (ref 0.3–1.2)
Total Protein: 7 g/dL (ref 6.5–8.1)

## 2016-02-08 LAB — CBC WITH DIFFERENTIAL/PLATELET
Basophils Absolute: 0 10*3/uL (ref 0.0–0.1)
Basophils Relative: 0 %
EOS ABS: 0 10*3/uL (ref 0.0–0.7)
EOS PCT: 0 %
HCT: 39.1 % (ref 39.0–52.0)
Hemoglobin: 13.1 g/dL (ref 13.0–17.0)
LYMPHS ABS: 0.5 10*3/uL — AB (ref 0.7–4.0)
Lymphocytes Relative: 3 %
MCH: 32.2 pg (ref 26.0–34.0)
MCHC: 33.5 g/dL (ref 30.0–36.0)
MCV: 96.1 fL (ref 78.0–100.0)
MONO ABS: 1.5 10*3/uL — AB (ref 0.1–1.0)
Monocytes Relative: 8 %
Neutro Abs: 16.6 10*3/uL — ABNORMAL HIGH (ref 1.7–7.7)
Neutrophils Relative %: 89 %
PLATELETS: 158 10*3/uL (ref 150–400)
RBC: 4.07 MIL/uL — AB (ref 4.22–5.81)
RDW: 13.3 % (ref 11.5–15.5)
WBC: 18.6 10*3/uL — AB (ref 4.0–10.5)

## 2016-02-08 LAB — URINALYSIS, ROUTINE W REFLEX MICROSCOPIC
BILIRUBIN URINE: NEGATIVE
GLUCOSE, UA: NEGATIVE mg/dL
Ketones, ur: NEGATIVE mg/dL
Leukocytes, UA: NEGATIVE
Nitrite: NEGATIVE
Protein, ur: NEGATIVE mg/dL
SPECIFIC GRAVITY, URINE: 1.01 (ref 1.005–1.030)
pH: 7.5 (ref 5.0–8.0)

## 2016-02-08 LAB — BASIC METABOLIC PANEL
Anion gap: 11 (ref 5–15)
BUN: 20 mg/dL (ref 6–20)
CALCIUM: 8.8 mg/dL — AB (ref 8.9–10.3)
CO2: 26 mmol/L (ref 22–32)
CREATININE: 1.18 mg/dL (ref 0.61–1.24)
Chloride: 101 mmol/L (ref 101–111)
GFR calc Af Amer: >60 mL/min (ref >60)
GFR, EST NON AFRICAN AMERICAN: 53 mL/min — AB (ref >60)
GLUCOSE: 166 mg/dL — AB (ref 65–99)
Potassium: 4.5 mmol/L (ref 3.5–5.1)
SODIUM: 138 mmol/L (ref 135–145)

## 2016-02-08 LAB — URINE MICROSCOPIC-ADD ON
Bacteria, UA: NONE SEEN
SQUAMOUS EPITHELIAL / LPF: NONE SEEN
WBC, UA: NONE SEEN WBC/hpf (ref 0–5)

## 2016-02-08 LAB — LACTIC ACID, PLASMA: LACTIC ACID, VENOUS: 2.2 mmol/L — AB (ref 0.5–2.0)

## 2016-02-08 LAB — LIPASE, BLOOD: Lipase: 19 U/L (ref 11–51)

## 2016-02-08 LAB — TROPONIN I: Troponin I: 0.06 ng/mL — ABNORMAL HIGH (ref <0.031)

## 2016-02-08 MED ORDER — LORAZEPAM 2 MG/ML IJ SOLN
0.5000 mg | Freq: Once | INTRAMUSCULAR | Status: AC
  Administered 2016-02-08: 0.5 mg via INTRAVENOUS
  Filled 2016-02-08: qty 1

## 2016-02-08 MED ORDER — IOHEXOL 300 MG/ML  SOLN
25.0000 mL | Freq: Once | INTRAMUSCULAR | Status: AC | PRN
Start: 2016-02-08 — End: 2016-02-08
  Administered 2016-02-08: 25 mL via ORAL

## 2016-02-08 MED ORDER — SODIUM CHLORIDE 0.9 % IV BOLUS (SEPSIS)
1000.0000 mL | Freq: Once | INTRAVENOUS | Status: AC
  Administered 2016-02-08: 1000 mL via INTRAVENOUS

## 2016-02-08 MED ORDER — SODIUM CHLORIDE 0.9% FLUSH
INTRAVENOUS | Status: AC
Start: 2016-02-08 — End: 2016-02-09
  Administered 2016-02-09: 01:00:00
  Filled 2016-02-08: qty 100

## 2016-02-08 MED ORDER — SODIUM CHLORIDE 0.9 % IJ SOLN
INTRAMUSCULAR | Status: AC
  Filled 2016-02-08: qty 100

## 2016-02-08 MED ORDER — IOHEXOL 300 MG/ML  SOLN
100.0000 mL | Freq: Once | INTRAMUSCULAR | Status: AC | PRN
  Administered 2016-02-08: 100 mL via INTRAVENOUS

## 2016-02-08 NOTE — ED Provider Notes (Signed)
CSN: [id number]     Arrival date & time 02/08/16  1241 History   First MD Initiated Contact with Patient 02/08/16 1301     Chief Complaint  Patient presents with  . Fall     (Consider location/radiation/quality/duration/timing/severity/associated sxs/prior Treatment) Patient is a 82 y.o. male presenting with fall. The history is provided by the spouse (The patient was found on the floor in the garage today he has not ambulated for 9 months because of a broken hip. The patient is demented and must have gotten up in the middle the night.).  Fall This is a new problem. The current episode started 6 to 12 hours ago. The problem occurs constantly. The problem has not changed since onset.Pertinent negatives include no abdominal pain. Nothing aggravates the symptoms. Nothing relieves the symptoms.    Past Medical History  Diagnosis Date  . Alzheimer disease   . Hypothyroidism   . Hyperlipidemia   . Neuropathy of lower extremity   . Urinary incontinence due to cognitive impairment 06/08/2015  . Other symptoms involving cardiovascular system 04/23/2012  . CKD (chronic kidney disease)    Past Surgical History  Procedure Laterality Date  . Hernia repair    . Intramedullary (im) nail intertrochanteric Left 08/23/2015    Procedure: INTRAMEDULLARY (IM) NAIL INTERTROCHANTRIC;  Surgeon: Cammy Copa, MD;  Location: [facility];  Service: Orthopedics;  Laterality: Left;   No family history on file. Social History  Substance Use Topics  . Smoking status: Former Smoker -- 40 years    Types: Cigarettes, Pipe    Quit date: 04/23/1972  . Smokeless tobacco: Former Neurosurgeon    Types: Chew    Quit date: 04/23/1972  . Alcohol Use: No    Review of Systems  Unable to perform ROS: Dementia  Gastrointestinal: Negative for abdominal pain.      Allergies  Naproxen and Sulfa antibiotics  Home Medications   Prior to Admission medications   Medication Sig Start Date End Date Taking? Authorizing  Provider  Cholecalciferol (VITAMIN D3) 2000 UNITS capsule Take 2,000 Units by mouth daily.   Yes Historical Provider, MD  donepezil (ARICEPT) 10 MG tablet Take 1 tablet (10 mg total) by mouth at bedtime. Do not start until march 2017 11/16/15  Yes Ernestina Penna, MD  feeding supplement, ENSURE ENLIVE, (ENSURE ENLIVE) LIQD Take 237 mLs by mouth 3 (three) times daily between meals. 08/25/15  Yes Belkys A Regalado, MD  gabapentin (NEURONTIN) 100 MG capsule Take 2 capsules (200 mg total) by mouth at bedtime. 01/30/16  Yes Ernestina Penna, MD  levothyroxine (SYNTHROID, LEVOTHROID) 75 MCG tablet Take 1 tablet (75 mcg total) by mouth daily. 02/10/15  Yes Ernestina Penna, MD  memantine (NAMENDA) 10 MG tablet Take 1 tablet (10 mg total) by mouth 2 (two) times daily. 11/17/15  Yes Frederica Kuster, MD  omeprazole (PRILOSEC) 20 MG capsule Take 1 capsule (20 mg total) by mouth daily. 12/13/15  Yes Ernestina Penna, MD   BP 118/80 mmHg  Pulse 127  Temp(Src) 98.1 F (36.7 C) (Rectal)  Resp 16  Ht 6' (1.829 m)  Wt 150 lb (68.04 kg)  BMI 20.34 kg/m2  SpO2 96% Physical Exam  Constitutional: He appears well-developed.  HENT:  Head: Normocephalic.  Eyes: Conjunctivae and EOM are normal. No scleral icterus.  Neck: Neck supple. No thyromegaly present.  Cardiovascular: Regular rhythm.  Exam reveals no gallop and no friction rub.   No murmur heard. Tachycardic  Pulmonary/Chest: No stridor. He  has no wheezes. He has no rales. He exhibits no tenderness.  Abdominal: He exhibits no distension. There is no tenderness. There is no rebound.  Genitourinary:  Rectal exam normal tone heme-negative  Musculoskeletal: Normal range of motion. He exhibits no edema.  Lymphadenopathy:    He has no cervical adenopathy.  Neurological: He is alert. He exhibits normal muscle tone. Coordination normal.  Patient alert will not answer any questions appropriately. According to family this is normal  Skin: No rash noted. No erythema.     ED Course  Procedures (including critical care time) Labs Review Labs Reviewed  URINALYSIS, ROUTINE W REFLEX MICROSCOPIC (NOT AT [facility]) - Abnormal; Notable for the following:    Hgb urine dipstick MODERATE (*)    All other components within normal limits  CBC WITH DIFFERENTIAL/PLATELET - Abnormal; Notable for the following:    WBC 18.6 (*)    RBC 4.07 (*)    Neutro Abs 16.6 (*)    Lymphs Abs 0.5 (*)    Monocytes Absolute 1.5 (*)    All other components within normal limits  BASIC METABOLIC PANEL - Abnormal; Notable for the following:    Glucose, Bld 166 (*)    Calcium 8.8 (*)    GFR calc non Af Amer 53 (*)    All other components within normal limits  HEPATIC FUNCTION PANEL - Abnormal; Notable for the following:    Albumin 3.4 (*)    AST 50 (*)    Alkaline Phosphatase 181 (*)    All other components within normal limits  TROPONIN I - Abnormal; Notable for the following:    Troponin I 0.06 (*)    All other components within normal limits  LACTIC ACID, PLASMA - Abnormal; Notable for the following:    Lactic Acid, Venous 2.2 (*)    All other components within normal limits  URINE MICROSCOPIC-ADD ON  LIPASE, BLOOD    Imaging Review Dg Chest 2 View  02/08/2016  CLINICAL DATA:  Dementia, fall today EXAM: CHEST  2 VIEW COMPARISON:  Chest x-rays dated 08/23/2015 and 09/30/2013. FINDINGS: Lungs are at least mildly hyperexpanded, based on the lateral projection, suggesting COPD. Suspect associated chronic bronchitic changes centrally. Coarse interstitial markings within each lung are likely indicative of an associated chronic interstitial lung disease. No confluent airspace opacity to suggest a developing pneumonia. No pleural effusion or pneumothorax seen. Heart size is normal. Overall cardiomediastinal silhouette remains within normal limits in size and configuration. Osseous and soft tissue structures about the chest are unremarkable. No osseous fracture or dislocation seen.  IMPRESSION: No acute findings. No evidence of pneumonia or pleural effusion. No osseous fracture or dislocation seen. Chronic-appearing findings detailed above. Electronically Signed   By: Bary RichardStan  Maynard M.D.   On: 02/08/2016 16:25   Dg Pelvis 1-2 Views  02/08/2016  CLINICAL DATA:  Possible fall EXAM: PELVIS - 1-2 VIEW COMPARISON:  08/23/2015 FINDINGS: There is hardware in the proximal left femur, transfixing an healed intertrochanteric femur fracture. No breakage or loosening of the hardware. No acute fracture. Osteopenia. Degenerative changes in the lower lumbar spine. IMPRESSION: No acute bony pathology. Postoperative and chronic changes are noted. Electronically Signed   By: Jolaine ClickArthur  Hoss M.D.   On: 02/08/2016 14:07   Ct Head Wo Contrast  02/08/2016  CLINICAL DATA:  Patient found lying on floor at home. EXAM: CT HEAD WITHOUT CONTRAST CT CERVICAL SPINE WITHOUT CONTRAST TECHNIQUE: Multidetector CT imaging of the head and cervical spine was performed following the standard protocol  without intravenous contrast. Multiplanar CT image reconstructions of the cervical spine were also generated. COMPARISON:  CT scan of head of August 28, 2015. FINDINGS: CT HEAD FINDINGS Bony calvarium appears intact. Moderate diffuse cortical atrophy is noted. Mild chronic ischemic white matter disease is noted. No mass effect or midline shift is noted. Ventricular size is within normal limits. There is no evidence of mass lesion, hemorrhage or acute infarction. CT CERVICAL SPINE FINDINGS No fracture is noted. Grade 1 anterolisthesis of C3-4 is noted secondary to posterior facet joint hypertrophy. Moderate degenerative disc disease is noted at C4-5, C5-6 and C6-7. Grade 1 anterolisthesis of C7-T1 is noted secondary to posterior facet joint hypertrophy. Biapical scarring is noted in the visualized lung fields. IMPRESSION: Moderate diffuse cortical atrophy is noted. Mild chronic ischemic white matter disease is noted. No acute  intracranial abnormality seen. Multilevel degenerative disc disease is noted. No acute abnormality seen in the cervical spine. Electronically Signed   By: Lupita Raider, M.D.   On: 02/08/2016 14:47   Ct Cervical Spine Wo Contrast  02/08/2016  CLINICAL DATA:  Patient found lying on floor at home. EXAM: CT HEAD WITHOUT CONTRAST CT CERVICAL SPINE WITHOUT CONTRAST TECHNIQUE: Multidetector CT imaging of the head and cervical spine was performed following the standard protocol without intravenous contrast. Multiplanar CT image reconstructions of the cervical spine were also generated. COMPARISON:  CT scan of head of August 28, 2015. FINDINGS: CT HEAD FINDINGS Bony calvarium appears intact. Moderate diffuse cortical atrophy is noted. Mild chronic ischemic white matter disease is noted. No mass effect or midline shift is noted. Ventricular size is within normal limits. There is no evidence of mass lesion, hemorrhage or acute infarction. CT CERVICAL SPINE FINDINGS No fracture is noted. Grade 1 anterolisthesis of C3-4 is noted secondary to posterior facet joint hypertrophy. Moderate degenerative disc disease is noted at C4-5, C5-6 and C6-7. Grade 1 anterolisthesis of C7-T1 is noted secondary to posterior facet joint hypertrophy. Biapical scarring is noted in the visualized lung fields. IMPRESSION: Moderate diffuse cortical atrophy is noted. Mild chronic ischemic white matter disease is noted. No acute intracranial abnormality seen. Multilevel degenerative disc disease is noted. No acute abnormality seen in the cervical spine. Electronically Signed   By: Lupita Raider, M.D.   On: 02/08/2016 14:47   Ct Abdomen Pelvis W Contrast  02/08/2016  CLINICAL DATA:  Found on floor unresponsive EXAM: CT ABDOMEN AND PELVIS WITH CONTRAST TECHNIQUE: Multidetector CT imaging of the abdomen and pelvis was performed using the standard protocol following bolus administration of intravenous contrast. CONTRAST:  25mL OMNIPAQUE IOHEXOL  300 MG/ML SOLN, OMNIPAQUE IOHEXOL 300 MG/ML SOLN COMPARISON:  None. FINDINGS: Lung bases are free of acute infiltrate or sizable effusion. The liver, gallbladder, spleen, adrenal glands and pancreas are within normal limits. The kidneys are well visualized and demonstrates some cystic change. No renal calculi or urinary tract obstructive changes are noted. Aortoiliac calcifications are noted without aneurysmal dilatation. The appendix is not well visualized although no inflammatory changes to suggest appendicitis are noted. Diverticular change is seen without evidence of diverticulitis. The bladder is well distended. The prostate is within normal limits. Postsurgical changes are noted in the right hip. Some pre sacral soft tissue changes are noted related to sacral fractures. These are noted within the sacral body and also appear to involve the S2 vertebral body specifically with some anterior displacement of the fracture fragment causing the localized hemorrhage. No other acute fracture is seen. Degenerative changes of  the lumbar spine are noted as well. IMPRESSION: Bilateral sacral fractures as described with some presacral pelvic hemorrhage identified. No sizable hematoma is noted. Scattered chronic changes as described above. Electronically Signed   By: Alcide Clever M.D.   On: 02/08/2016 20:30   I have personally reviewed and evaluated these images and lab results as part of my medical decision-making.   EKG Interpretation   Date/Time:  Thursday February 08 2016 12:45:04 EDT Ventricular Rate:  115 PR Interval:  163 QRS Duration: 130 QT Interval:  352 QTC Calculation: 487 R Axis:   92 Text Interpretation:  Sinus tachycardia RBBB and LPFB Confirmed by Emerson Barretto   MD, Jomarie Longs [phone]) on 02/08/2016 5:33:51 PM Also confirmed by Abreanna Drawdy  MD,  Jomarie Longs [phone])  on 02/08/2016 8:39:33 PM      MDM   Final diagnoses:  None    Patient will be admitted for elevated troponin and sacral fractures. I spoke  with orthopedic surgeon Dr. Eulah Pont and he recommended ambulation as tolerated.    Bethann Berkshire, MD 02/09/16 2138

## 2016-02-08 NOTE — H&P (Signed)
[facility] History and Physical  Joseph [name] ZOX:[id number] DOB: 26-Nov-1925    PCP:   Rudi HeapMOORE, DONALD, MD   Chief Complaint: Fall  HPI: Joseph [name] is an 82 y.o. male   With a hx of Alzheimer's dementia, HLD, hypothyroidism, and CKD stage 3 who presents with complaints of a fall. The patient was found lying on his right side on the floor of his garage earlier today by his wife. The patient is demented, so the amount of time lying on the floor is unknown, and whether there was any LOC. He has not ambulated in 9 months due to a hip fracture that he sustained in October.   While in the ED, workup showed alkaline phosphatase 181, troponin 0.06, lactic acid 2.2. His WBC was elevated at 18.6 Glucose 166. UA was unremarkable. CXR, and x-ray pelvis showed no acute abnormalities. CT head and cervical spine showed no acute intracranial findings. CT abd showed evidence of bilateral sacral Fx. Hospitalist was asked to admit for pain control, physical therapy and SNF placement.   Rewiew of Systems: Unable to obtain due to patient's mental status   Past Medical History  Diagnosis Date  . Alzheimer disease   . Hypothyroidism   . Hyperlipidemia   . Neuropathy of lower extremity   . Urinary incontinence due to cognitive impairment 06/08/2015  . Other symptoms involving cardiovascular system 04/23/2012  . CKD (chronic kidney disease)     Past Surgical History  Procedure Laterality Date  . Hernia repair    . Intramedullary (im) nail intertrochanteric Left 08/23/2015    Procedure: INTRAMEDULLARY (IM) NAIL INTERTROCHANTRIC;  Surgeon: Cammy CopaScott Gregory Dean, MD;  Location: [facility];  Service: Orthopedics;  Laterality: Left;    Medications:  HOME MEDS: Prior to Admission medications   Medication Sig Start Date End Date Taking? Authorizing Provider  Cholecalciferol (VITAMIN D3) 2000 UNITS capsule Take 2,000 Units by mouth daily.   Yes Historical Provider, MD  donepezil (ARICEPT) 10 MG tablet Take 1  tablet (10 mg total) by mouth at bedtime. Do not start until march 2017 11/16/15  Yes Ernestina Pennaonald W Moore, MD  feeding supplement, ENSURE ENLIVE, (ENSURE ENLIVE) LIQD Take 237 mLs by mouth 3 (three) times daily between meals. 08/25/15  Yes Belkys A Regalado, MD  gabapentin (NEURONTIN) 100 MG capsule Take 2 capsules (200 mg total) by mouth at bedtime. 01/30/16  Yes Ernestina Pennaonald W Moore, MD  levothyroxine (SYNTHROID, LEVOTHROID) 75 MCG tablet Take 1 tablet (75 mcg total) by mouth daily. 02/10/15  Yes Ernestina Pennaonald W Moore, MD  memantine (NAMENDA) 10 MG tablet Take 1 tablet (10 mg total) by mouth 2 (two) times daily. 11/17/15  Yes Frederica KusterStephen M Miller, MD  omeprazole (PRILOSEC) 20 MG capsule Take 1 capsule (20 mg total) by mouth daily. 12/13/15  Yes Ernestina Pennaonald W Moore, MD     Allergies:  Allergies  Allergen Reactions  . Naproxen Other (See Comments)    unknown  . Sulfa Antibiotics Other (See Comments)    unknown    Social History:   reports that he quit smoking about 43 years ago. His smoking use included Cigarettes and Pipe. He quit after 40 years of use. He quit smokeless tobacco use about 43 years ago. His smokeless tobacco use included Chew. He reports that he does not drink alcohol or use illicit drugs.  Family History: No family history on file.   Physical Exam: Filed Vitals:   02/08/16 2042 02/08/16 2107 02/08/16 2129 02/08/16 2130  BP: 113/67 118/80  118/80 129/68  Pulse: 124  127   Temp:      TempSrc:      Resp: 18  16   Height:      Weight:      SpO2: 95%  96%    Blood pressure 129/68, pulse 127, temperature 98.1 F (36.7 C), temperature source Rectal, resp. rate 16, height 6' (1.829 m), weight 68.04 kg (150 lb), SpO2 96 %.  GEN:  Pleasant, patient lying in the stretcher in no acute distress; cooperative with exam. PSYCH:  alert and oriented x4; does not appear anxious or depressed; affect is appropriate. HEENT: Mucous membranes pink and anicteric; PERRLA; EOM intact; no cervical lymphadenopathy nor  thyromegaly or carotid bruit; no JVD; There were no stridor. Neck is very supple. Breasts:: Not examined CHEST WALL: No tenderness CHEST: Normal respiration, clear to auscultation bilaterally.  HEART: Regular rate and rhythm.  There are no murmur, rub, or gallops.   BACK: No kyphosis or scoliosis; no CVA tenderness ABDOMEN: soft and non-tender; no masses, no organomegaly, normal abdominal bowel sounds; no pannus; no intertriginous candida. There is no rebound and no distention. Rectal Exam: Not done EXTREMITIES: No bone or joint deformity; age-appropriate arthropathy of the hands and knees; no edema; no ulcerations.  There is no calf tenderness. Genitalia: not examined PULSES: 2+ and symmetric SKIN: Normal hydration no rash or ulceration CNS: Cranial nerves 2-12 grossly intact no focal lateralizing neurologic deficit.  Speech is fluent; uvula elevated with phonation, facial symmetry and tongue midline. DTR are normal bilaterally, cerebella exam is intact, barbinski is negative and strengths are equaled bilaterally.  No sensory loss.   Labs on Admission:  Basic Metabolic Panel:  Recent Labs Lab 02/08/16 1320  NA 138  K 4.5  CL 101  CO2 26  GLUCOSE 166*  BUN 20  CREATININE 1.18  CALCIUM 8.8*   Liver Function Tests:  Recent Labs Lab 02/08/16 1320  AST 50*  ALT 45  ALKPHOS 181*  BILITOT 1.0  PROT 7.0  ALBUMIN 3.4*    Recent Labs Lab 02/08/16 1320  LIPASE 19   No results for input(s): AMMONIA in the last 168 hours. CBC:  Recent Labs Lab 02/08/16 1320  WBC 18.6*  NEUTROABS 16.6*  HGB 13.1  HCT 39.1  MCV 96.1  PLT 158   Cardiac Enzymes:  Recent Labs Lab 02/08/16 1320  TROPONINI 0.06*    Radiological Exams on Admission: Dg Chest 2 View  02/08/2016  CLINICAL DATA:  Dementia, fall today EXAM: CHEST  2 VIEW COMPARISON:  Chest x-rays dated 08/23/2015 and 09/30/2013. FINDINGS: Lungs are at least mildly hyperexpanded, based on the lateral projection, suggesting  COPD. Suspect associated chronic bronchitic changes centrally. Coarse interstitial markings within each lung are likely indicative of an associated chronic interstitial lung disease. No confluent airspace opacity to suggest a developing pneumonia. No pleural effusion or pneumothorax seen. Heart size is normal. Overall cardiomediastinal silhouette remains within normal limits in size and configuration. Osseous and soft tissue structures about the chest are unremarkable. No osseous fracture or dislocation seen. IMPRESSION: No acute findings. No evidence of pneumonia or pleural effusion. No osseous fracture or dislocation seen. Chronic-appearing findings detailed above. Electronically Signed   By: Bary Richard M.D.   On: 02/08/2016 16:25   Dg Pelvis 1-2 Views  02/08/2016  CLINICAL DATA:  Possible fall EXAM: PELVIS - 1-2 VIEW COMPARISON:  08/23/2015 FINDINGS: There is hardware in the proximal left femur, transfixing an healed intertrochanteric femur fracture. No breakage or  loosening of the hardware. No acute fracture. Osteopenia. Degenerative changes in the lower lumbar spine. IMPRESSION: No acute bony pathology. Postoperative and chronic changes are noted. Electronically Signed   By: Jolaine Click M.D.   On: 02/08/2016 14:07   Ct Head Wo Contrast  02/08/2016  CLINICAL DATA:  Patient found lying on floor at home. EXAM: CT HEAD WITHOUT CONTRAST CT CERVICAL SPINE WITHOUT CONTRAST TECHNIQUE: Multidetector CT imaging of the head and cervical spine was performed following the standard protocol without intravenous contrast. Multiplanar CT image reconstructions of the cervical spine were also generated. COMPARISON:  CT scan of head of August 28, 2015. FINDINGS: CT HEAD FINDINGS Bony calvarium appears intact. Moderate diffuse cortical atrophy is noted. Mild chronic ischemic white matter disease is noted. No mass effect or midline shift is noted. Ventricular size is within normal limits. There is no evidence of mass  lesion, hemorrhage or acute infarction. CT CERVICAL SPINE FINDINGS No fracture is noted. Grade 1 anterolisthesis of C3-4 is noted secondary to posterior facet joint hypertrophy. Moderate degenerative disc disease is noted at C4-5, C5-6 and C6-7. Grade 1 anterolisthesis of C7-T1 is noted secondary to posterior facet joint hypertrophy. Biapical scarring is noted in the visualized lung fields. IMPRESSION: Moderate diffuse cortical atrophy is noted. Mild chronic ischemic white matter disease is noted. No acute intracranial abnormality seen. Multilevel degenerative disc disease is noted. No acute abnormality seen in the cervical spine. Electronically Signed   By: Lupita Raider, M.D.   On: 02/08/2016 14:47   Ct Cervical Spine Wo Contrast  02/08/2016  CLINICAL DATA:  Patient found lying on floor at home. EXAM: CT HEAD WITHOUT CONTRAST CT CERVICAL SPINE WITHOUT CONTRAST TECHNIQUE: Multidetector CT imaging of the head and cervical spine was performed following the standard protocol without intravenous contrast. Multiplanar CT image reconstructions of the cervical spine were also generated. COMPARISON:  CT scan of head of August 28, 2015. FINDINGS: CT HEAD FINDINGS Bony calvarium appears intact. Moderate diffuse cortical atrophy is noted. Mild chronic ischemic white matter disease is noted. No mass effect or midline shift is noted. Ventricular size is within normal limits. There is no evidence of mass lesion, hemorrhage or acute infarction. CT CERVICAL SPINE FINDINGS No fracture is noted. Grade 1 anterolisthesis of C3-4 is noted secondary to posterior facet joint hypertrophy. Moderate degenerative disc disease is noted at C4-5, C5-6 and C6-7. Grade 1 anterolisthesis of C7-T1 is noted secondary to posterior facet joint hypertrophy. Biapical scarring is noted in the visualized lung fields. IMPRESSION: Moderate diffuse cortical atrophy is noted. Mild chronic ischemic white matter disease is noted. No acute intracranial  abnormality seen. Multilevel degenerative disc disease is noted. No acute abnormality seen in the cervical spine. Electronically Signed   By: Lupita Raider, M.D.   On: 02/08/2016 14:47   Ct Abdomen Pelvis W Contrast  02/08/2016  CLINICAL DATA:  Found on floor unresponsive EXAM: CT ABDOMEN AND PELVIS WITH CONTRAST TECHNIQUE: Multidetector CT imaging of the abdomen and pelvis was performed using the standard protocol following bolus administration of intravenous contrast. CONTRAST:  25mL OMNIPAQUE IOHEXOL 300 MG/ML SOLN, OMNIPAQUE IOHEXOL 300 MG/ML SOLN COMPARISON:  None. FINDINGS: Lung bases are free of acute infiltrate or sizable effusion. The liver, gallbladder, spleen, adrenal glands and pancreas are within normal limits. The kidneys are well visualized and demonstrates some cystic change. No renal calculi or urinary tract obstructive changes are noted. Aortoiliac calcifications are noted without aneurysmal dilatation. The appendix is not well visualized although  no inflammatory changes to suggest appendicitis are noted. Diverticular change is seen without evidence of diverticulitis. The bladder is well distended. The prostate is within normal limits. Postsurgical changes are noted in the right hip. Some pre sacral soft tissue changes are noted related to sacral fractures. These are noted within the sacral body and also appear to involve the S2 vertebral body specifically with some anterior displacement of the fracture fragment causing the localized hemorrhage. No other acute fracture is seen. Degenerative changes of the lumbar spine are noted as well. IMPRESSION: Bilateral sacral fractures as described with some presacral pelvic hemorrhage identified. No sizable hematoma is noted. Scattered chronic changes as described above. Electronically Signed   By: Alcide Clever M.D.   On: 02/08/2016 20:30    EKG: Independently reviewed.    Assessment/Plan Present on Admission:  . Syncope and collapse .  Sacral fracture, closed (HCC) . Alzheimer's dementia without behavioral disturbance . Vitamin D deficiency   1. Syncope and collapse. CT cervical spine and head showed no acute intracranial abnormality. BP wnl. Troponin 0.06. Lactic acid 2.2. WBC 18.6. Will continue to monitor. 2. Sacral Fx, closed. CT abd showed bilateral sacral Fx's with some presacral pelvic hemorrhage. No sizable hematoma was noted. X-ray pelvis was non-acute. Will give meds for pain control. Will consult social service for rehab placement.  Consult physical therapy.  This is a non surgical Fx.  3. Alzheimer's dementia without behavioral distrubances 4. Vitamin D deficiency 5. Hypothyroidism. Will give synthroid. 6. HLD. Will give statins   Other plans as per orders. Code Status: Full  Houston Siren, MD. [facility] Pager [phone] 7pm to 7am.  02/08/2016, 10:19 PM  By signing my name below, I, Adron Bene, attest that this documentation has been prepared under the direction and in the presence of Houston Siren, MD. Electronically Signed: Adron Bene, Scribe 02/08/2016 10:25pm

## 2016-02-08 NOTE — ED Notes (Signed)
Pt found on floor by wheelchair in garage by daughter this am. Daughter reported to EMS pt was lying on right side. Unknown loc or time lying on floor. Per EMS pt daughter reports pt has not walked in years and has dementia. Pt alert and oriented to person upon arrival to ED.

## 2016-02-08 NOTE — ED Notes (Signed)
CRITICAL VALUE ALERT  Critical value received:  Lactic Acid 2.2  Date of notification:  02/08/16  Time of notification:  1940 hrs  Critical value read back:Yes.    Nurse who received alert:  Y. Kimara Bencomo, RN  Responding MD:  Dr. Estell HarpinZammit  Time MD responded:  2000 hrs

## 2016-02-09 ENCOUNTER — Encounter (INDEPENDENT_AMBULATORY_CARE_PROVIDER_SITE_OTHER): Payer: Medicare Other | Admitting: Family Medicine

## 2016-02-09 ENCOUNTER — Encounter (HOSPITAL_COMMUNITY): Payer: Self-pay | Admitting: *Deleted

## 2016-02-09 DIAGNOSIS — L89624 Pressure ulcer of left heel, stage 4: Secondary | ICD-10-CM | POA: Diagnosis not present

## 2016-02-09 DIAGNOSIS — I248 Other forms of acute ischemic heart disease: Secondary | ICD-10-CM | POA: Diagnosis not present

## 2016-02-09 DIAGNOSIS — D62 Acute posthemorrhagic anemia: Secondary | ICD-10-CM | POA: Diagnosis not present

## 2016-02-09 DIAGNOSIS — L89213 Pressure ulcer of right hip, stage 3: Secondary | ICD-10-CM

## 2016-02-09 DIAGNOSIS — R410 Disorientation, unspecified: Secondary | ICD-10-CM | POA: Diagnosis not present

## 2016-02-09 DIAGNOSIS — F0281 Dementia in other diseases classified elsewhere with behavioral disturbance: Secondary | ICD-10-CM

## 2016-02-09 DIAGNOSIS — F0280 Dementia in other diseases classified elsewhere without behavioral disturbance: Secondary | ICD-10-CM | POA: Diagnosis not present

## 2016-02-09 DIAGNOSIS — G92 Toxic encephalopathy: Secondary | ICD-10-CM | POA: Diagnosis not present

## 2016-02-09 DIAGNOSIS — S32111A Minimally displaced Zone I fracture of sacrum, initial encounter for closed fracture: Secondary | ICD-10-CM

## 2016-02-09 DIAGNOSIS — G309 Alzheimer's disease, unspecified: Secondary | ICD-10-CM | POA: Diagnosis not present

## 2016-02-09 DIAGNOSIS — S3210XA Unspecified fracture of sacrum, initial encounter for closed fracture: Secondary | ICD-10-CM | POA: Diagnosis not present

## 2016-02-09 LAB — MRSA PCR SCREENING: MRSA BY PCR: NEGATIVE

## 2016-02-09 LAB — TROPONIN I
TROPONIN I: 0.05 ng/mL — AB (ref <0.031)
TROPONIN I: 0.05 ng/mL — AB (ref <0.031)
Troponin I: 0.07 ng/mL — ABNORMAL HIGH (ref <0.031)

## 2016-02-09 MED ORDER — ONDANSETRON HCL 4 MG/2ML IJ SOLN: 4.0000 mg | Freq: Four times a day (QID) | INTRAMUSCULAR | Status: DC | PRN

## 2016-02-09 MED ORDER — VITAMIN D 1000 UNITS PO TABS
2000.0000 [IU] | ORAL_TABLET | Freq: Every day | ORAL | Status: DC
  Administered 2016-02-09 – 2016-02-13 (×5): 2000 [IU] via ORAL
  Filled 2016-02-09 (×6): qty 2

## 2016-02-09 MED ORDER — PANTOPRAZOLE SODIUM 40 MG PO TBEC
40.0000 mg | DELAYED_RELEASE_TABLET | Freq: Every day | ORAL | Status: DC
  Administered 2016-02-09 – 2016-02-13 (×5): 40 mg via ORAL
  Filled 2016-02-09 (×5): qty 1

## 2016-02-09 MED ORDER — LEVOTHYROXINE SODIUM 75 MCG PO TABS
75.0000 ug | ORAL_TABLET | Freq: Every day | ORAL | Status: DC
  Administered 2016-02-09 – 2016-02-13 (×5): 75 ug via ORAL
  Filled 2016-02-09 (×5): qty 1

## 2016-02-09 MED ORDER — GABAPENTIN 100 MG PO CAPS
200.0000 mg | ORAL_CAPSULE | Freq: Every day | ORAL | Status: DC
  Administered 2016-02-09 – 2016-02-11 (×4): 200 mg via ORAL
  Filled 2016-02-09 (×4): qty 2

## 2016-02-09 MED ORDER — ONDANSETRON HCL 4 MG PO TABS: 4.0000 mg | ORAL_TABLET | Freq: Four times a day (QID) | ORAL | Status: DC | PRN

## 2016-02-09 MED ORDER — MEMANTINE HCL 10 MG PO TABS
10.0000 mg | ORAL_TABLET | Freq: Two times a day (BID) | ORAL | Status: DC
  Administered 2016-02-09 – 2016-02-13 (×9): 10 mg via ORAL
  Filled 2016-02-09 (×10): qty 1

## 2016-02-09 MED ORDER — DONEPEZIL HCL 5 MG PO TABS
10.0000 mg | ORAL_TABLET | Freq: Every day | ORAL | Status: DC
  Administered 2016-02-09 – 2016-02-12 (×4): 10 mg via ORAL
  Filled 2016-02-09 (×4): qty 2

## 2016-02-09 MED ORDER — DEXTROSE-NACL 5-0.9 % IV SOLN
INTRAVENOUS | Status: DC
  Administered 2016-02-09 – 2016-02-12 (×4): via INTRAVENOUS

## 2016-02-09 MED ORDER — SODIUM CHLORIDE 0.9% FLUSH
3.0000 mL | Freq: Two times a day (BID) | INTRAVENOUS | Status: DC
  Administered 2016-02-09 – 2016-02-12 (×4): 3 mL via INTRAVENOUS

## 2016-02-09 MED ORDER — ENSURE ENLIVE PO LIQD
237.0000 mL | Freq: Three times a day (TID) | ORAL | Status: DC
  Administered 2016-02-09 – 2016-02-13 (×10): 237 mL via ORAL
  Filled 2016-02-09 (×2): qty 237

## 2016-02-09 MED ORDER — MORPHINE SULFATE (PF) 2 MG/ML IV SOLN
2.0000 mg | INTRAVENOUS | Status: DC | PRN
  Administered 2016-02-10 – 2016-02-12 (×2): 2 mg via INTRAVENOUS
  Filled 2016-02-09 (×2): qty 1

## 2016-02-09 NOTE — Clinical Social Work Note (Signed)
Clinical Social Work Assessment  Patient Details  Name: Joseph [name] MRN: [id number] Date of Birth: [birth year]  Date of referral:  02/09/16               Reason for consult:  Facility Placement, Discharge Planning                Permission sought to share information with:  Case Manager, Facility Sport and exercise psychologist, Family Supports Permission granted to share information::  Yes, Verbal Permission Granted  Name::        Agency::  [facility]: SNF  Relationship::  wife and daughter part of care.  Contact Information:     Housing/Transportation Living arrangements for the past 2 months:  [place] of Information:  Medical Team, Facility, Spouse Patient Interpreter Needed:  None Criminal Activity/Legal Involvement Pertinent to Current Situation/Hospitalization:  No - Comment as needed Significant Relationships:  Adult Children, Other Family Members, Spouse Lives with:  Spouse Do you feel safe going back to the place where you live?    Need for family participation in patient care:  Yes (Comment) (declined memory)  Care giving concerns:  LCSW met with patient daughter and wife at the bedside. Daughter reports patient was recently discharged from [facility] in January 2017 and has had 60 days of wellness meaning SNF "skilled" days have restarted.  Daughter reports she think patient is sleep walking as he is able to get up out of the bed, walk down the hall and walk out of the house which is how he fell.  Daughter reports patient primarily uses a wheelchair to transport and within the home.  She reports they have tried bed alarms and straps to keep patient in placed and not get up and walk. Daughter is the only other support for patient and her mother (pt wife).  Daughter reports she does not live with parents, [place] and helps on a daily basis with needs in the home. Daughter reports she has looked into LTC and was told by Medicaid that they would have to  spend down, wait for over 2 years for LTC medicaid and they have not pursued this as an option. There is some funding for sitters or private duty, but daughter reports she cannot spend all money on pt as her mother is also declining. Reports she is open to SNF if appropriate. She will not allow pt to return to [facility] as she was not happy with outcomes or care. She is working with brother and open to [facility] and [facility]. She has given permission to fax out and complete referrals. LCSW is also awaiting PT recommendations and if patient is capable of skilled care, ability to learn due to cognitive limitations.    Social Worker assessment / plan:  See above as part of assessment. Plan:  LCSW will follow up with pt and family regarding outcomes of PT consult as well as disposition. Currently, pt is being worked up for [facility] with family and daughters understanding barriers being memory limitations and if patient is skill able as he is walking at home (but safety concerns). FL2 will be updated and patient has an existing passar.  WIll follow up with disposition Monday.  Employment status:  Retired Health visitor, Managed Care PT Recommendations:  Not assessed at this time Information / Referral to community resources:  Other (Comment Required) (none reported needed at this time. Family understanding of SNF)  Patient/Family's Response  to care:  Agreeable to plan  Patient/Family's Understanding of and Emotional Response to Diagnosis, Current Treatment, and Prognosis:  Daughter voices frustration with insurance system, SNF level of care and overall her father having a fall. She reports exhaustion, but ability to continue providing assistance to parents. She is very aware of his prognosis, dx, and needs at this time.  Emotional Assessment Appearance:  Appears stated age Attitude/Demeanor/Rapport:  Crying, Screaming (in pain, pt did calm down during assessment) Affect  (typically observed):  Agitated, Irritable Orientation:  Oriented to Self Alcohol / Substance use:  Not Applicable Psych involvement (Current and /or in the community):  No (Comment)  Discharge Needs  Concerns to be addressed:  Adjustment to Illness, Discharge Planning Concerns Readmission within the last 30 days:  No Current discharge risk:  Cognitively Impaired, Physical Impairment Barriers to Discharge:  Continued Medical Work up   [place], LCSW 02/09/2016, 1:54 PM

## 2016-02-09 NOTE — Consult Note (Signed)
The following was obtained from the record   Violet Fulton [name] Glasser [id number] DOB: [birth year]    PCP:   Rudi HeapMOORE, DONALD, MD   Chief Complaint: Fall  HPI: Joseph [name] Throgmorton is an 82 y.o. male   With a hx of Alzheimer's dementia, HLD, hypothyroidism, and CKD stage 3 who presents with complaints of a fall. The patient was found lying on his right side on the floor of his garage earlier today by his wife. The patient is demented, so the amount of time lying on the floor is unknown, and whether there was any LOC. He has not ambulated in 9 months due to a hip fracture that he sustained in October.   While in the ED, workup showed alkaline phosphatase 181, troponin 0.06, lactic acid 2.2. His WBC was elevated at 18.6 Glucose 166. UA was unremarkable. CXR, and x-ray pelvis showed no acute abnormalities. CT head and cervical spine showed no acute intracranial findings. CT abd showed evidence of bilateral sacral Fx. Hospitalist was asked to admit for pain control, physical therapy and SNF placement.   Rewiew of Systems: Unable to obtain due to patient's mental status  Past Medical History  Diagnosis Date  . Alzheimer disease   . Hypothyroidism   . Hyperlipidemia   . Neuropathy of lower extremity   . Urinary incontinence due to cognitive impairment 06/08/2015  . Other symptoms involving cardiovascular system 04/23/2012  . CKD (chronic kidney disease)    Past Surgical History  Procedure Laterality Date  . Hernia repair    . Intramedullary (im) nail intertrochanteric Left 08/23/2015    Procedure: INTRAMEDULLARY (IM) NAIL INTERTROCHANTRIC;  Surgeon: Cammy CopaScott Gregory Dean, MD;  Location: [facility];  Service: Orthopedics;  Laterality: Left;   Social History  Substance Use Topics  . Smoking status: Former Smoker -- 40 years    Types: Cigarettes, Pipe    Quit date: 04/23/1972  . Smokeless tobacco: Former NeurosurgeonUser    Types: Chew    Quit date: 04/23/1972  . Alcohol Use: No   BP 122/68 mmHg  Pulse 109  Temp(Src) 97.9 F  (36.6 C) (Axillary)  Resp 20  Ht 6' (1.829 m)  Wt 142 lb 13.7 oz (64.8 kg)  BMI 19.37 kg/m2  SpO2 95%  Physical Exam  Constitutional: He appears well-developed and well-nourished. No distress.  HENT:  Head: Normocephalic.  Eyes: Conjunctivae are normal. Right eye exhibits no discharge. Left eye exhibits no discharge.  Neck: No tracheal deviation present. No thyromegaly present.  Cardiovascular: Intact distal pulses.   Pulmonary/Chest: No respiratory distress.  Abdominal: He exhibits no distension.  Musculoskeletal:  Leg lengths are equal . He has mild pain with rotation of both hips   muscle tone is normal in each leg  Alignment is normal   Knees hips and ankle are reduced and stable   Lymphadenopathy:    He has no cervical adenopathy.  Neurological: He is alert.  Skin: Skin is warm and dry. He is not diaphoretic.  Psychiatric: He has a normal mood and affect. His speech is tangential. He is slowed.   I interpret his xrays as no fracture on plain film And bilateral non displaced sacral fractures on CT   This is in agreement with the dictated reports that I read   Sacral fractures bilateral  Non surgical   wbat with assist

## 2016-02-09 NOTE — ED Notes (Signed)
Informed Dr. Nedra HaiLee, patient's troponin lab increased to 0.07 from 0.06 last draw.

## 2016-02-09 NOTE — [place] FL2 (Signed)
[place] MEDICAID FL2 LEVEL OF CARE SCREENING TOOL     IDENTIFICATION  Patient Name: Joseph [name] Birthdate: 1926/07/02 Sex: male Admission Date (Current Location): 02/08/2016  [facility] and [place] Number:  [employer] and Address:  [facility],  [address] [place] [address]      Provider Number: [id number]  Attending Physician Name and Address:  Houston Siren, MD  Relative Name and Phone Number:       Current Level of Care: Hospital Recommended Level of Care: Nursing Facility, Skilled Nursing Facility Prior Approval Number:    Date Approved/Denied:   PASRR Number: [id number]   SS#   [id number]  Discharge Plan: SNF    Current Diagnoses: Patient Active Problem List   Diagnosis Date Noted  . Syncope and collapse 02/08/2016  . Sacral fracture, closed (HCC) 02/08/2016  . Syncope 08/29/2015  . Elevated d-dimer 08/29/2015  . Closed left hip fracture (HCC)   . Hip fracture (HCC) 08/23/2015  . Intertrochanteric fracture of left hip (HCC) 08/23/2015  . Chronic kidney disease, stage III (moderate) 08/23/2015  . Lower extremity neuropathy 08/23/2015  . Leukocytosis 08/23/2015  . Arthropathy of cervical spine 08/23/2015  . Hematuria 08/23/2015  . Urinary incontinence due to cognitive impairment 06/08/2015  . Alzheimer's dementia without behavioral disturbance 02/01/2014  . BPH (benign prostatic hyperplasia) 02/01/2014  . Right hydrocele 02/01/2014  . Memory disturbance 04/14/2013  . Vitamin D deficiency 04/14/2013  . Elevated serum creatinine 04/14/2013  . Hyperlipemia 04/14/2013  . Hypothyroid 04/14/2013  . Fatigue 04/14/2013  . Leg weakness, bilateral 04/14/2013  . Other symptoms involving cardiovascular system 04/23/2012    Orientation RESPIRATION BLADDER Height & Weight     Self  Normal Incontinent Weight: 142 lb 13.7 oz (64.8 kg) Height:  6' (182.9 cm)  BEHAVIORAL SYMPTOMS/MOOD NEUROLOGICAL BOWEL NUTRITION STATUS  Other (Comment)  (confused)   Incontinent Diet (regular)  AMBULATORY STATUS COMMUNICATION OF NEEDS Skin   Extensive Assist Verbally Bruising                       Personal Care Assistance Level of Assistance  Bathing, Feeding, Dressing Bathing Assistance: Limited assistance Feeding assistance: Limited assistance Dressing Assistance: Limited assistance     Functional Limitations Info  Sight, Hearing, Speech Sight Info: Adequate Hearing Info: Impaired Speech Info: Adequate    SPECIAL CARE FACTORS FREQUENCY  PT (By licensed PT), OT (By licensed OT)     PT Frequency: 5 OT Frequency: 5            Contractures Contractures Info: Not present    Additional Factors Info  Code Status, Allergies, Psychotropic, Insulin Sliding Scale, Isolation Precautions Code Status Info: Full Allergies Info: Naproxen, Sulfa Antibiotics Psychotropic Info: none Insulin Sliding Scale Info: none Isolation Precautions Info: none     Current Medications (02/09/2016):  This is the current hospital active medication list Current Facility-Administered Medications  Medication Dose Route Frequency Provider Last Rate Last Dose  . cholecalciferol (VITAMIN D) tablet 2,000 Units  2,000 Units Oral Daily Houston Siren, MD   2,000 Units at 02/09/16 1100  . dextrose 5 %-0.9 % sodium chloride infusion   Intravenous Continuous Houston Siren, MD 50 mL/hr at 02/09/16 0112    . donepezil (ARICEPT) tablet 10 mg  10 mg Oral QHS Houston Siren, MD   10 mg at 02/09/16 0116  . feeding supplement (ENSURE ENLIVE) (ENSURE ENLIVE) liquid 237 mL  237 mL Oral TID BM Houston Siren, MD  237 mL at 02/09/16 1300  . gabapentin (NEURONTIN) capsule 200 mg  200 mg Oral QHS Houston SirenPeter Le, MD   200 mg at 02/09/16 0114  . levothyroxine (SYNTHROID, LEVOTHROID) tablet 75 mcg  75 mcg Oral Daily Houston SirenPeter Le, MD   75 mcg at 02/09/16 1100  . memantine (NAMENDA) tablet 10 mg  10 mg Oral BID Houston SirenPeter Le, MD   10 mg at 02/09/16 1100  . morphine 2 MG/ML injection 2 mg  2 mg Intravenous Q2H  PRN Houston SirenPeter Le, MD      . ondansetron [facility](ZOFRAN) tablet 4 mg  4 mg Oral Q6H PRN Houston SirenPeter Le, MD       Or  . ondansetron [facility](ZOFRAN) injection 4 mg  4 mg Intravenous Q6H PRN Houston SirenPeter Le, MD      . pantoprazole (PROTONIX) EC tablet 40 mg  40 mg Oral Daily Houston SirenPeter Le, MD   40 mg at 02/09/16 1100  . sodium chloride flush (NS) 0.9 % injection 3 mL  3 mL Intravenous Q12H Houston SirenPeter Le, MD   3 mL at 02/09/16 0113     Discharge Medications: Please see discharge summary for a list of discharge medications.  Relevant Imaging Results:  Relevant Lab Results:   Additional Information    Raye SorrowCoble, Hannah N, LCSW

## 2016-02-09 NOTE — Clinical Social Work Note (Addendum)
CSW left a voicemail message for patient's wife, Tyler AasDoris, to discuss SNF recommendation.  It should be noted that patient has not ambulated in several months which reduces rehab potential. SNF would likely be long term, private pay if patient's family chose to do so. CSW did not find family at patient's bedside to further discuss.    Annice NeedySettle, Kaelem Brach D, [place] [phone]

## 2016-02-09 NOTE — Clinical Social Work Placement (Signed)
   CLINICAL SOCIAL WORK PLACEMENT  NOTE  Date:  02/09/2016  Patient Details  Name: Joseph [name] P Kathman MRN: [id number] Date of Birth: Sep 19, 1926  Clinical Social Work is seeking post-discharge placement for this patient at the Skilled  Nursing Facility level of care (*CSW will initial, date and re-position this form in  chart as items are completed):  Yes   Patient/family provided with [facility] Clinical Social Work Department's list of facilities offering this level of care within the geographic area requested by the patient (or if unable, by the patient's family).  Yes   Patient/family informed of their freedom to choose among providers that offer the needed level of care, that participate in Medicare, Medicaid or managed care program needed by the patient, have an available bed and are willing to accept the patient.  Yes   Patient/family informed of [facility]'s ownership interest [facility] and [facility], as well as of the fact that they are under no obligation to receive care at these facilities.  PASRR submitted to EDS on       PASRR number received on       Existing PASRR number confirmed on 02/09/16     FL2 transmitted to all facilities in geographic area requested by pt/family on 02/09/16     FL2 transmitted to all facilities within larger geographic area on       Patient informed that his/her managed care company has contracts with or will negotiate with certain facilities, including the following:            Patient/family informed of bed offers received.  Patient chooses bed at       Physician recommends and patient chooses bed at      Patient to be transferred to   on  .  Patient to be transferred to facility by       Patient family notified on   of transfer.  Name of family member notified:        PHYSICIAN Please sign FL2     Additional Comment:    _______________________________________________ Raye Sorrowoble, Solaris Kram N, LCSW 02/09/2016, 2:02 PM

## 2016-02-09 NOTE — Progress Notes (Signed)
Patient resting in bed with no signs of distress noted.  Report given to 300, family informed about patient moving. Transferred to floor via bed.

## 2016-02-10 DIAGNOSIS — F0280 Dementia in other diseases classified elsewhere without behavioral disturbance: Secondary | ICD-10-CM | POA: Diagnosis not present

## 2016-02-10 DIAGNOSIS — G92 Toxic encephalopathy: Secondary | ICD-10-CM | POA: Diagnosis not present

## 2016-02-10 DIAGNOSIS — S3210XA Unspecified fracture of sacrum, initial encounter for closed fracture: Secondary | ICD-10-CM | POA: Diagnosis not present

## 2016-02-10 DIAGNOSIS — G309 Alzheimer's disease, unspecified: Secondary | ICD-10-CM | POA: Diagnosis not present

## 2016-02-10 DIAGNOSIS — R410 Disorientation, unspecified: Secondary | ICD-10-CM | POA: Diagnosis not present

## 2016-02-10 DIAGNOSIS — D62 Acute posthemorrhagic anemia: Secondary | ICD-10-CM | POA: Diagnosis not present

## 2016-02-10 DIAGNOSIS — I248 Other forms of acute ischemic heart disease: Secondary | ICD-10-CM | POA: Diagnosis not present

## 2016-02-11 DIAGNOSIS — G309 Alzheimer's disease, unspecified: Secondary | ICD-10-CM | POA: Diagnosis not present

## 2016-02-11 DIAGNOSIS — I248 Other forms of acute ischemic heart disease: Secondary | ICD-10-CM | POA: Diagnosis not present

## 2016-02-11 DIAGNOSIS — S3210XA Unspecified fracture of sacrum, initial encounter for closed fracture: Secondary | ICD-10-CM | POA: Diagnosis not present

## 2016-02-11 DIAGNOSIS — F0280 Dementia in other diseases classified elsewhere without behavioral disturbance: Secondary | ICD-10-CM | POA: Diagnosis not present

## 2016-02-11 DIAGNOSIS — G92 Toxic encephalopathy: Secondary | ICD-10-CM | POA: Diagnosis not present

## 2016-02-11 DIAGNOSIS — D62 Acute posthemorrhagic anemia: Secondary | ICD-10-CM | POA: Diagnosis not present

## 2016-02-11 DIAGNOSIS — R410 Disorientation, unspecified: Secondary | ICD-10-CM | POA: Diagnosis not present

## 2016-02-12 ENCOUNTER — Encounter (HOSPITAL_COMMUNITY): Payer: Self-pay | Admitting: Internal Medicine

## 2016-02-12 DIAGNOSIS — D62 Acute posthemorrhagic anemia: Secondary | ICD-10-CM | POA: Diagnosis not present

## 2016-02-12 DIAGNOSIS — G309 Alzheimer's disease, unspecified: Secondary | ICD-10-CM | POA: Diagnosis not present

## 2016-02-12 DIAGNOSIS — R410 Disorientation, unspecified: Secondary | ICD-10-CM | POA: Diagnosis not present

## 2016-02-12 DIAGNOSIS — R7989 Other specified abnormal findings of blood chemistry: Secondary | ICD-10-CM | POA: Diagnosis present

## 2016-02-12 DIAGNOSIS — G92 Toxic encephalopathy: Secondary | ICD-10-CM | POA: Diagnosis not present

## 2016-02-12 DIAGNOSIS — I248 Other forms of acute ischemic heart disease: Secondary | ICD-10-CM | POA: Diagnosis not present

## 2016-02-12 DIAGNOSIS — F0280 Dementia in other diseases classified elsewhere without behavioral disturbance: Secondary | ICD-10-CM | POA: Diagnosis not present

## 2016-02-12 DIAGNOSIS — R778 Other specified abnormalities of plasma proteins: Secondary | ICD-10-CM | POA: Diagnosis present

## 2016-02-12 DIAGNOSIS — S3210XA Unspecified fracture of sacrum, initial encounter for closed fracture: Principal | ICD-10-CM

## 2016-02-12 LAB — COMPREHENSIVE METABOLIC PANEL
ALBUMIN: 2.8 g/dL — AB (ref 3.5–5.0)
ALK PHOS: 117 U/L (ref 38–126)
ALT: 22 U/L (ref 17–63)
ANION GAP: 7 (ref 5–15)
AST: 25 U/L (ref 15–41)
BILIRUBIN TOTAL: 1.2 mg/dL (ref 0.3–1.2)
BUN: 14 mg/dL (ref 6–20)
CALCIUM: 8 mg/dL — AB (ref 8.9–10.3)
CO2: 24 mmol/L (ref 22–32)
CREATININE: 0.96 mg/dL (ref 0.61–1.24)
Chloride: 105 mmol/L (ref 101–111)
GFR calc Af Amer: >60 mL/min (ref >60)
GFR calc non Af Amer: >60 mL/min (ref >60)
GLUCOSE: 145 mg/dL — AB (ref 65–99)
Potassium: 3.5 mmol/L (ref 3.5–5.1)
SODIUM: 136 mmol/L (ref 135–145)
TOTAL PROTEIN: 6.2 g/dL — AB (ref 6.5–8.1)

## 2016-02-12 LAB — CBC
HCT: 34.4 % — ABNORMAL LOW (ref 39.0–52.0)
HEMOGLOBIN: 11.7 g/dL — AB (ref 13.0–17.0)
MCH: 32.1 pg (ref 26.0–34.0)
MCHC: 34 g/dL (ref 30.0–36.0)
MCV: 94.5 fL (ref 78.0–100.0)
PLATELETS: 169 10*3/uL (ref 150–400)
RBC: 3.64 MIL/uL — ABNORMAL LOW (ref 4.22–5.81)
RDW: 13.2 % (ref 11.5–15.5)
WBC: 10.9 10*3/uL — ABNORMAL HIGH (ref 4.0–10.5)

## 2016-02-12 LAB — T4, FREE: Free T4: 0.99 ng/dL (ref 0.61–1.12)

## 2016-02-12 LAB — CK: Total CK: 122 U/L (ref 49–397)

## 2016-02-12 LAB — TSH: TSH: 3.059 u[IU]/mL (ref 0.350–4.500)

## 2016-02-12 LAB — TROPONIN I

## 2016-02-12 MED ORDER — GABAPENTIN 100 MG PO CAPS
100.0000 mg | ORAL_CAPSULE | Freq: Every day | ORAL | Status: DC
  Administered 2016-02-12: 100 mg via ORAL
  Filled 2016-02-12: qty 1

## 2016-02-12 MED ORDER — ACETAMINOPHEN 160 MG/5ML PO SOLN
500.0000 mg | Freq: Four times a day (QID) | ORAL | Status: DC
  Administered 2016-02-12 – 2016-02-13 (×3): 500 mg via ORAL
  Filled 2016-02-12 (×3): qty 20.3

## 2016-02-12 NOTE — Evaluation (Signed)
Physical Therapy Evaluation Patient Details Name: Joseph [name] MRN: [id number] DOB: 1926/01/07 Today's Date: 02/12/2016   History of Present Illness  Joseph [name] is an 82yo white male who comes to [facility] on 3/30 after sustaining a fall at home. Upon arrival pt found to have bilateral sacral fractures, which ortho has consulted on and described as non-operative, recommending 'WBAT with assistance.' The patient sustained a fall back in [place] 2016 requriing hip ORIF, and was at [facility] from October to January. At baseline, pt has moderate-severe cognitive deficits due to alzheimer's dementia, which are interpreted by this PT as described by daughter as moderate language deficits (requiring gestures to best follow commands) and not recognizing family for some time now. Pt requires  total care for ADL, except for self feeding. Daughter tells this therapist that since pt retruned home from SNF she has been ambulating with her father up and down the hall with a walker and that he assists in pivoting to the commode. This is different from what daughter verbalized to the first therapist.  There is a aide who comes to the home twice a week for two hours at a time.  Clinical Impression  Pt was very lethargic while therapist was attempting to work with patient; he would not open his eyes or follow commands..  Family feels this is due to pain medication more than dementia.  Therapist from previous SNF state that patient Alzheimer's was a factor in  Rehab potential.  At this time I would assume that rehabilitation potential is low secondary to inability to follow commands and although he is in need of 24 hour care he most likely will not have skilled potential.  We will attempt to see this patient to satisfy the family in a day or two when pt is more alert to make a more accurate assessment.   Follow Up Recommendations Supervision/Assistance - 24 hour;Home health PT (possibly)    Equipment Recommendations  None recommended by  PT    Recommendations for Other Services   none    Precautions / Restrictions Precautions Precautions: Fall Precaution Comments: Big time falls risk. No score available.  Restrictions Weight Bearing Restrictions: Yes Other Position/Activity Restrictions: WBAT      Mobility  Bed Mobility Overal bed mobility: Needs Assistance Bed Mobility: Supine to Sit;Sit to Supine     Supine to sit: Total assist Sit to supine: Total assist   General bed mobility comments: Requires help coming to seated EOB with retropulsion constant, requrining total assist to avoid posterior collapse; daughter assists with repositioning of patient after returned to supine. Sccoting requires +2.   Transfers Overall transfer level:  (unable pt pushes against therapist )                        Balance Overall balance assessment: Needs assistance;History of Falls   Sitting balance-Leahy Scale: Zero   Postural control: Posterior lean                                   Pertinent Vitals/Pain Pain Assessment: Faces Faces Pain Scale: Hurts whole lot Pain Intervention(s): Limited activity within patient's tolerance    Home Living Family/patient expects to be discharged to::  (Family expects SNF but this is not approprate at this time.  Recommend nursing home with  home health therapy. ) Living Arrangements: Spouse/significant other Available Help at Discharge: Family;Personal care attendant;Available PRN/intermittently  Type of Home: House         Home Equipment: Dan HumphreysWalker - 2 wheels;Bedside commode;Wheelchair - manual;Hospital bed      Prior Function Level of Independence: Needs assistance   Gait / Transfers Assistance Needed: conflicting reports.  Daughter has stated stand pivot only to one therapist; to the other daughter states that she walks with her father up and down the halls with a rolling walker.    ADL's / Homemaking Assistance Needed: Total care except for feeding.          Hand Dominance        Extremity/Trunk Assessment   Upper Extremity Assessment: Generalized weakness;Difficult to assess due to impaired cognition           Lower Extremity Assessment: Difficult to assess due to impaired cognition         Communication   Communication: Expressive difficulties;Receptive difficulties;HOH  Cognition Arousal/Alertness: Lethargic Behavior During Therapy: Restless;Agitated;Impulsive;Anxious Overall Cognitive Status: History of cognitive impairments - at baseline       Memory: Decreased short-term memory;Decreased recall of precautions                       Assessment/Plan    PT Assessment  (Will attempt therapy one more time for family sake. )  PT Diagnosis Difficulty walking;Acute pain;Generalized weakness (decreased mobility)   PT Problem List    PT Treatment Interventions     PT Goals (Current goals can be found in the Care Plan section) Acute Rehab PT Goals PT Goal Formulation: Patient unable to participate in goal setting Time For Goal Achievement: 02/14/16 Potential to Achieve Goals:  (unknown)    Frequency  2x a week    Barriers to discharge  low rehab potential          End of Session   Activity Tolerance: Patient limited by lethargy;Patient limited by pain;Treatment limited secondary to agitation Patient left: in bed;with family/visitor present;with call bell/phone within reach      Functional Limitation: Changing and maintaining body position Changing and Maintaining Body Position Current Status ([id number](G8981): At least 80 percent but less than 100 percent impaired, limited or restricted Changing and Maintaining Body Position Goal Status (F6213(G8982): At least 80 percent but less than 100 percent impaired, limited or restricted Changing and Maintaining Body Position Discharge Status [phone]([id number]): At least 80 percent but less than 100 percent impaired, limited or restricted    Time: 1440-1515 PT Time Calculation (min)  (ACUTE ONLY): 35 min   Charges:   PT Evaluation $PT Eval High Complexity: 1 Procedure PT Treatments $Therapeutic Activity: 8-22 mins        Virgina OrganCynthia Russell, PT CLT [phone] 02/12/2016, 3:26 PM

## 2016-02-12 NOTE — Telephone Encounter (Signed)
Spoke to patients daughter and she is upset and believes her father isn't getting good care and she wanted DWM to be aware and know if there is anything he can do to help. Pt's daughter is aware that DWM is out of the office until Wednesday.

## 2016-02-12 NOTE — Telephone Encounter (Signed)
Please discuss this patient with me on Wednesday

## 2016-02-12 NOTE — Evaluation (Signed)
Physical Therapy Evaluation Patient Details Name: Joseph [name] MRN: [id number] DOB: Jun 22, 1926 Today's Date: 02/12/2016   History of Present Illness  Joseph [name] is an 82yo white male who comes to [facility] on 3/30 after sustaining a fall at home. Upon arrival pt found to have bilateral sacral fractures, which ortho has consulted on and described as non-operative, recommending 'WBAT with assistance.' The patient sustained a fall back in [place] 2016 requriing hip ORIF, and was at [facility] from October to January. At baseline, pt has moderate-severe cognitive deficits due to alzheimer's dementia, which are interpreted by this PT as described by daughter as moderate language deficits (requiring gestures to best follow commands) and not recognizing family for some time now. Pt requires  total care for ADL, except for self feeding. Since DC from [facility], pt has required [place]-total assistance for supine <-> sitting, and been able to perform standing pivot transfers only with supervision due to safety concerns.  Daughter reports that HHPT was able to do some walking with the patient s/p DC from [facility], but pt does not ambulated regularly. The pt lives at home with wife, who is physically unable to care for the patient , with most physical assistance from daughter who checks in from time to time, and in home aide who is there 2d/week for 1-2hours.   Clinical Impression  At evaluation, pt is received semirecumbent in bed upon entry, daughter and wife present. The pt appears to be asleep, although is intermittently thrashing around, moaning, and throwing hands in air. Pt is aroused with great effort, requiring combined firm tactile stimulation with loud verbal stimulation: daughter appears to be recording video of session on a mobile device. No acute distress noted at this time, however after total assist transition to sitting EOB, pt becomes increasingly agitated with noted grimacing. The patient maintains eyes closed throughout, not  responding to repeated verbal and physical stimulation from PT and/or daughter at bedside. Pt is returned to supine with total assistance +2, and appears to be resting with a higher degree of comfort. Pt global strength as screened during functional mobility assessment presents with moderate-severe impairment, the pt requiring heavy physical assistance for bed mobility, however this is the patient's baseline functional level. Unable to attempt transfers at this time due to the above.     When daughter is asked what DC setting would be ideal independent of financial restrictions, she reports long-term care with specialized attention to pts c dementia: this PT agrees. Given the limited availability to additional caregiver support/assistance, and physical limitations of the patient's spouse, I am concerned with DC to home. I will personally contact the staff at [facility] rehab to determine the patient's ability to participate in PT, versus current AMS being more related to medications as daughter suggests.   I do not believe this patient to be a good candidate for PT services at this time for the following reasons: 1. Pt is unable to participate in PT and will gain equal functional benefit from mobility with caregivers during assisted care.  2. Pt is has cognitive deficits that complicate the role of pain as a limiting factor in mobility/exercise.  3. Pt was previously non-ambulatory at baseline, and presents at this functional level currently.  4. All acute mobility needs can be met with equipment or additional personal assistance.     Follow Up Recommendations No PT follow up;Supervision/Assistance - 24 hour;SNF;Other (comment) (Pt would be ideally situated in a memory care unit where he can have 24/7 supervision  for safety. )    Equipment Recommendations  None recommended by PT (FAmily many benefit from use of a lift at home if pt returns to home. )    Recommendations for Other Services        Precautions / Restrictions Precautions Precautions: None Precaution Comments: Big time falls risk. No score available.  Restrictions Weight Bearing Restrictions: Yes Other Position/Activity Restrictions: WBAT      Mobility  Bed Mobility Overal bed mobility: +2 for physical assistance;Needs Assistance Bed Mobility: Supine to Sit;Sit to Supine     Supine to sit: Total assist Sit to supine: Total assist   General bed mobility comments: Requires help coming to seated EOB with retropulsion constant, requrining total assist to avoid posterior collapse; daughter assists with repositioning of patient after returned to supine. Sccoting requires +2.   Transfers Overall transfer level:  (Pt is not agreeable, not following commands, does not tolerate sitting EOB for more than 90s. )                  Ambulation/Gait                Stairs            Wheelchair Mobility    Modified Rankin (Stroke Patients Only)       Balance Overall balance assessment: Needs assistance;History of Falls   Sitting balance-Leahy Scale: Zero   Postural control: Posterior lean                                   Pertinent Vitals/Pain Pain Assessment: 0-10 (patient is unable to verbalize pain report due to dementia. ) Faces Pain Scale: Hurts whole lot Pain Intervention(s): Limited activity within patient's tolerance;Monitored during session;Premedicated before session;Repositioned    Home Living Family/patient expects to be discharged to:: Private residence Living Arrangements: Spouse/significant other Available Help at Discharge: Family;Personal care attendant;Available PRN/intermittently Type of Home: House         Home Equipment: Walker - 2 wheels;Bedside commode;Wheelchair - manual;Hospital bed      Prior Function Level of Independence: Needs assistance   Gait / Transfers Assistance Needed: standing pivot transfers with supervision only, does not ambulate  regularly. suspected of performing some wandering at night, found on the floor several times.   ADL's / Homemaking Assistance Needed: Total care except for feeding.         Hand Dominance        Extremity/Trunk Assessment   Upper Extremity Assessment: Generalized weakness;Difficult to assess due to impaired cognition           Lower Extremity Assessment: Generalized weakness;Difficult to assess due to impaired cognition         Communication   Communication: Expressive difficulties;Receptive difficulties;HOH (dementia related language deficits. )  Cognition Arousal/Alertness: Lethargic Behavior During Therapy: Restless;Agitated;Impulsive;Anxious Overall Cognitive Status: History of cognitive impairments - at baseline       Memory: Decreased short-term memory;Decreased recall of precautions              General Comments      Exercises        Assessment/Plan    PT Assessment Patent does not need any further PT services  PT Diagnosis Acute pain;Generalized weakness;Altered mental status   PT Problem List    PT Treatment Interventions     PT Goals (Current goals can be found in the Care Plan section) Acute Rehab PT Goals  PT Goal Formulation: All assessment and education complete, DC therapy    Frequency     Barriers to discharge        Co-evaluation               End of Session   Activity Tolerance: Patient limited by lethargy;Patient limited by pain;Treatment limited secondary to agitation Patient left: in bed;with family/visitor present;with call bell/phone within reach      Functional Limitation: Changing and maintaining body position Changing and Maintaining Body Position Current Status (E7209): At least 80 percent but less than 100 percent impaired, limited or restricted Changing and Maintaining Body Position Goal Status (O7096): At least 80 percent but less than 100 percent impaired, limited or restricted Changing and Maintaining  Body Position Discharge Status [phone]): At least 80 percent but less than 100 percent impaired, limited or restricted    Time: 1031-1058 PT Time Calculation (min) (ACUTE ONLY): 27 min   Charges:   PT Evaluation $PT Eval High Complexity: 1 Procedure     PT G Codes:   PT G-Codes **NOT FOR INPATIENT CLASS** Functional Limitation: Changing and maintaining body position Changing and Maintaining Body Position Current Status (Q9476): At least 80 percent but less than 100 percent impaired, limited or restricted Changing and Maintaining Body Position Goal Status (L4650): At least 80 percent but less than 100 percent impaired, limited or restricted Changing and Maintaining Body Position Discharge Status [phone]): At least 80 percent but less than 100 percent impaired, limited or restricted    1:31 PM, 02/12/2016 Etta Grandchild, PT, DPT PRN Physical Therapist at [place] License # [id number] [phone] (wireless)  [phone] (mobile)

## 2016-02-12 NOTE — Clinical Social Work Note (Signed)
CSW and CM met with patient, spouse, Ms. Justin Mend, and daughter, Demetrios Loll, both were at bedside.  Ms. Jimmye Norman, was very upset related to the PT evaluation. She continued to repeat that patient should be given a chance to go to a SNF to attempt rehab again. She advised that his care was too much for her and her mother.  CM and CSW attempted multiple times to explain that patient's rehab potential was significantly limited by his cognitive functioning and abilities.  Ms. Jimmye Norman requested that another physical therapist come and evaluate patient.    Ihor Gully, [place] [phone]

## 2016-02-12 NOTE — Progress Notes (Addendum)
[facility] PROGRESS NOTE  Joseph [name] DOB: [birth year] DOA: 02/08/2016 PCP: Rudi HeapMOORE, DONALD, MD    Code Status: Full code Family Communication: Discussed with wife and daughter Disposition Plan: Discharge to home with PT versus skilled nursing facility placement.   Consultants:  Orthopedic, Dr. Romeo AppleHarrison  Procedures:  None  Antibiotics:  None  HPI/Subjective: Patient is in bed and appears to be in no acute distress. Family is at the bedside. He is demented and chronically confused.  Objective: Filed Vitals:   02/12/16 0416 02/12/16 1041  BP: 131/68   Pulse: 100 108  Temp: 98.9 F (37.2 C)   Resp: 18   Oxygen saturation 95% on room air.  Intake/Output Summary (Last 24 hours) at 02/12/16 1306 Last data filed at 02/12/16 0700  Gross per 24 hour  Intake 2006.33 ml  Output    500 ml  Net 1506.33 ml   Filed Weights   02/08/16 1231 02/09/16 1037 02/09/16 2213  Weight: 68.04 kg (150 lb) 64.8 kg (142 lb 13.7 oz) 64.2 kg (141 lb 8.6 oz)    Exam:   General: Debilitated appearing elderly 82 year old Caucasian man in no acute distress.  Cardiovascular: S1, S2, with a soft systolic murmur and borderline tachycardia.  Respiratory: Clear anteriorly with decreased breath sounds in the bases.  Abdomen: Positive bowel sounds, soft, nontender, nondistended.   Musculoskeletal/extremities: Unable to palpate sacrum, but pelvis appear to be nontender; no acute hot red joints of his lower extremities; no pedal edema.  Neurologic: The patient is asleep, and arousable, he has evidence of periodic jerking of his extremities; responsive to provocation, but he does not answer to his name and occasionally grunts.   Data Reviewed: Basic Metabolic Panel:  Recent Labs Lab 02/08/16 1320 02/12/16 0920  NA 138 136  K 4.5 3.5  CL 101 105  CO2 26 24  GLUCOSE 166* 145*  BUN 20 14  CREATININE 1.18 0.96  CALCIUM 8.8* 8.0*   Liver Function Tests:  Recent  Labs Lab 02/08/16 1320 02/12/16 0920  AST 50* 25  ALT 45 22  ALKPHOS 181* 117  BILITOT 1.0 1.2  PROT 7.0 6.2*  ALBUMIN 3.4* 2.8*    Recent Labs Lab 02/08/16 1320  LIPASE 19   No results for input(s): AMMONIA in the last 168 hours. CBC:  Recent Labs Lab 02/08/16 1320 02/12/16 0920  WBC 18.6* 10.9*  NEUTROABS 16.6*  --   HGB 13.1 11.7*  HCT 39.1 34.4*  MCV 96.1 94.5  PLT 158 169   Cardiac Enzymes:  Recent Labs Lab 02/08/16 1320 02/09/16 0114 02/09/16 0716 02/09/16 1358 02/12/16 0920  CKTOTAL  --   --   --   --  122  TROPONINI 0.06* 0.07* 0.05* 0.05* <0.03   BNP (last 3 results) No results for input(s): BNP in the last 8760 hours.  ProBNP (last 3 results) No results for input(s): PROBNP in the last 8760 hours.  CBG: No results for input(s): GLUCAP in the last 168 hours.  Recent Results (from the past 240 hour(s))  MRSA PCR Screening     Status: None   Collection Time: 02/09/16  8:59 AM  Result Value Ref Range Status   MRSA by PCR NEGATIVE NEGATIVE Final    Comment:        The GeneXpert MRSA Assay (FDA approved for NASAL specimens only), is one component of a comprehensive MRSA colonization surveillance program. It is not intended to diagnose MRSA infection nor to guide or monitor treatment for  MRSA infections.      Studies: No results found.  Scheduled Meds: . cholecalciferol  2,000 Units Oral Daily  . donepezil  10 mg Oral QHS  . feeding supplement (ENSURE ENLIVE)  237 mL Oral TID BM  . gabapentin  200 mg Oral QHS  . levothyroxine  75 mcg Oral Daily  . memantine  10 mg Oral BID  . pantoprazole  40 mg Oral Daily  . sodium chloride flush  3 mL Intravenous Q12H   Continuous Infusions: . dextrose 5 % and 0.9% NaCl 50 mL/hr at 02/10/16 1539   Brief history: 82 year old man with advanced dementia, hypothyroidism, chronic kidney disease, and left hip fracture October 2016, who was admitted to the hospital on 02/08/2016 after his family  found him down at home in the garage. The family believes that he was sleepwalking. He was not found until the following morning. There was no known loss of consciousness. X-rays and CT scans were nonacute with exception of the CT of the abdomen and pelvis which revealed bilateral sacral fractures with some presacral pelvic hemorrhage, but no sizable hematoma. Orthopedic surgeon, Dr. Romeo Apple evaluated the patient-the sacral fractures were nonsurgical and he recommended weightbearing as tolerated with assistance. Patient is being reevaluated by PT.  Principal Problem:   Sacral fracture, closed (HCC) Active Problems:   Vitamin D deficiency   Hypothyroid   Alzheimer's dementia without behavioral disturbance   Elevated troponin I level   Acute blood loss anemia    1. Sacral fracture with associated presacral pelvic hemorrhage, status post fall at home. The fall was unwitnessed, so syncope was not ruled in. -Patient was started on gentle IV fluids and as needed IV morphine for pain. He was continued on gabapentin. -Await follow-up PT evaluation for need for short-term skilled nursing facility placement. -Will discontinue IV morphine and try oral analgesics in anticipation that the patient will be discharged soon.  Acute blood loss anemia. Patient's hemoglobin was 13.1 on admission. It has drifted down to 11.7, likely from equilibration from the pelvic hemorrhage and IV fluids. No indication for packed red blood cell transfusions.  Elevated troponin I. Patient's troponin I was elevated minimally at 0.06. Elevation was likely secondary to demand ischemia. His troponin I has normalized.  Hypothyroidism. Patient was continued on Synthroid. His TSH was within normal limits.  Hyperglycemia. Patient's blood glucose has been modestly elevated. This may be secondary to dextrose and IV fluids. Will order a hemoglobin A1c. We'll hold off on insulin for now.  Advanced Alzheimer's dementia. He was  continued on Aricept and Namenda. Patient is noted to have some jerking motions occasionally while he is asleep. I do not believe he is having seizures. It may be from opiate analgesics or he may be dreaming. Will discontinue IV morphine and try oral hydrocodone or tylenol as needed for pain.  Time spent: 40 minutes including time for discussion with family.    [facility]  [facility] Pager [phone]. If 7PM-7AM, please contact night-coverage at [email or web address], password [facility] 02/12/2016, 1:06 PM  LOS: 4 days

## 2016-02-13 DIAGNOSIS — G92 Toxic encephalopathy: Secondary | ICD-10-CM | POA: Diagnosis not present

## 2016-02-13 DIAGNOSIS — I248 Other forms of acute ischemic heart disease: Secondary | ICD-10-CM | POA: Diagnosis not present

## 2016-02-13 DIAGNOSIS — G309 Alzheimer's disease, unspecified: Secondary | ICD-10-CM | POA: Diagnosis not present

## 2016-02-13 DIAGNOSIS — D62 Acute posthemorrhagic anemia: Secondary | ICD-10-CM | POA: Diagnosis not present

## 2016-02-13 DIAGNOSIS — R410 Disorientation, unspecified: Secondary | ICD-10-CM | POA: Diagnosis not present

## 2016-02-13 DIAGNOSIS — R279 Unspecified lack of coordination: Secondary | ICD-10-CM | POA: Diagnosis not present

## 2016-02-13 DIAGNOSIS — W19XXXA Unspecified fall, initial encounter: Secondary | ICD-10-CM | POA: Diagnosis not present

## 2016-02-13 DIAGNOSIS — F0280 Dementia in other diseases classified elsewhere without behavioral disturbance: Secondary | ICD-10-CM | POA: Diagnosis not present

## 2016-02-13 DIAGNOSIS — Z7401 Bed confinement status: Secondary | ICD-10-CM | POA: Diagnosis not present

## 2016-02-13 DIAGNOSIS — S3210XA Unspecified fracture of sacrum, initial encounter for closed fracture: Secondary | ICD-10-CM | POA: Diagnosis not present

## 2016-02-13 LAB — BASIC METABOLIC PANEL
ANION GAP: 7 (ref 5–15)
BUN: 15 mg/dL (ref 6–20)
CHLORIDE: 104 mmol/L (ref 101–111)
CO2: 27 mmol/L (ref 22–32)
Calcium: 8.2 mg/dL — ABNORMAL LOW (ref 8.9–10.3)
Creatinine, Ser: 0.98 mg/dL (ref 0.61–1.24)
Glucose, Bld: 110 mg/dL — ABNORMAL HIGH (ref 65–99)
POTASSIUM: 3.4 mmol/L — AB (ref 3.5–5.1)
SODIUM: 138 mmol/L (ref 135–145)

## 2016-02-13 LAB — CBC
HEMATOCRIT: 35.2 % — AB (ref 39.0–52.0)
HEMOGLOBIN: 11.7 g/dL — AB (ref 13.0–17.0)
MCH: 31.8 pg (ref 26.0–34.0)
MCHC: 33.2 g/dL (ref 30.0–36.0)
MCV: 95.7 fL (ref 78.0–100.0)
PLATELETS: 193 10*3/uL (ref 150–400)
RBC: 3.68 MIL/uL — AB (ref 4.22–5.81)
RDW: 13.4 % (ref 11.5–15.5)
WBC: 8.3 10*3/uL (ref 4.0–10.5)

## 2016-02-13 LAB — HEMOGLOBIN A1C
HEMOGLOBIN A1C: 5.4 % (ref 4.8–5.6)
MEAN PLASMA GLUCOSE: 108 mg/dL

## 2016-02-13 MED ORDER — ACETAMINOPHEN 160 MG/5ML PO SOLN: 500.0000 mg | Freq: Four times a day (QID) | ORAL | Status: DC | PRN

## 2016-02-13 MED ORDER — POTASSIUM CHLORIDE ER 10 MEQ PO TBCR: 10.0000 meq | EXTENDED_RELEASE_TABLET | Freq: Every day | ORAL | Status: DC

## 2016-02-13 MED ORDER — HYDROCODONE-ACETAMINOPHEN 5-325 MG PO TABS: 1.0000 | ORAL_TABLET | Freq: Four times a day (QID) | ORAL | Status: DC | PRN

## 2016-02-13 MED ORDER — GABAPENTIN 100 MG PO CAPS: 100.0000 mg | ORAL_CAPSULE | Freq: Every day | ORAL | Status: DC

## 2016-02-13 MED ORDER — ACETAMINOPHEN 160 MG/5ML PO SOLN: 500.0000 mg | Freq: Four times a day (QID) | ORAL | Status: DC

## 2016-02-13 NOTE — Progress Notes (Signed)
EMS here to transport the patient home. IV and condom catheter has been removed. Patient stable at this time.

## 2016-02-13 NOTE — Care Management (Signed)
CM contacted by pt's daughter. Family is unable to come back to hospital to pick up Rx and AVS today, pt will be discharged home via EMS. Family will come to hospital on 4/4 and pick up AVS and prescriptions.

## 2016-02-13 NOTE — Care Management Important Message (Signed)
Important Message  Patient Details  Name: Joseph [name] MRN: [id number] Date of Birth: 01-08-1926   Medicare Important Message Given:  Yes    Malcolm MetroChildress, Ruthanna Macchia Demske, RN 02/13/2016, 2:02 PM

## 2016-02-13 NOTE — Discharge Summary (Signed)
Physician Discharge Summary  Joseph [name] VHQ:[id number] DOB: 1926/05/05 DOA: 02/08/2016  PCP: Rudi Heap, MD  Admit date: 02/08/2016 Discharge date: 02/13/2016  Time spent: Greater than 30 minutes  Recommendations for Outpatient Follow-up:  1. Home health physical therapy ordered at discharge.     Discharge Diagnoses:  1. Status post fall at home resulting in a bilateral sacral fracture. 2. Mild presacral pelvic hemorrhage without a sizable hematoma per CT. 3. Mild acute blood loss anemia. 4. Mildly elevated troponin I, secondary to fall and demand ischemia. 5. Hypothyroidism. 6. Alzheimer's dementia. 7. Vitamin D deficiency. 8. Mild hypokalemia. 9. Acute encephalopathy, secondary to IV morphine.  Discharge Condition: Improved.  Diet recommendation: Dysphagia 3.  Filed Weights   02/08/16 1231 02/09/16 1037 02/09/16 2213  Weight: 68.04 kg (150 lb) 64.8 kg (142 lb 13.7 oz) 64.2 kg (141 lb 8.6 oz)    History of present illness:  The patient is an 82 year old man with a history of Alzheimer's dementia, left hip fracture status post repair in October 2016, and hypothyroidism, who presented to the emergency department on 02/08/2016 after being found down at home lying on the floor in the garage. In the ED, CT of his abdomen and pelvis revealed bilateral sacral fracture and presacral pelvic hemorrhage. He was admitted for further evaluation and management.  Hospital Course:  1. Sacral fracture with associated presacral pelvic hemorrhage, status post fall at home. The fall was unwitnessed, so syncope was not determined. -Patient was started on gentle IV fluids and as needed IV morphine for pain. He was continued on gabapentin. Physical therapist initially evaluated him, but the patient could not dissipate due to his lethargy. -Due to his lethargy thought to be secondary to IV morphine, morphine was discontinued. When he became more alert, the physical therapist evaluated him again.  Patient was not felt to be a rehabilitation candidate due to his advanced dementia. However, the PT did recommend home health PT which was ordered. Per family request, he was prescribed Vicodin as needed for pain at the time of discharge.  Acute blood loss anemia. Patient's hemoglobin was 13.1 on admission. It has drifted down to 11.7, likely from equilibration from the pelvic hemorrhage and IV fluids. There was no indication for packed red blood cell transfusions.  Elevated troponin I. Patient's troponin I was elevated minimally at 0.06. Elevation was likely secondary to demand ischemia. His troponin I did normalized.  Hypothyroidism. Patient was continued on Synthroid. His TSH was within normal limits.  Hyperglycemia. Patient's blood glucose was modestly elevated. The mild elevation was likely secondary to dextrose in the IV fluids. His hemoglobin A1c was within normal limits.   Advanced Alzheimer's dementia. He was continued on Aricept and Namenda. Patient was noted to have some jerking motions occasionally while he was asleep. I did not  believe he was having seizures.  The jerking while he was asleep was likely from IV morphine. The jerking subsided when morphine was discontinued.   Hypokalemia.  Patient's serum potassium fell slightly to 3.4. He was discharged with oral potassium chloride supplementation for 2 weeks.    Procedures:  None  Consultations:  Orthopedic, Fuller Canada M.D.  Discharge Exam: Filed Vitals:   02/12/16 2200 02/13/16 0537  BP: 128/64 132/78  Pulse: 100 101  Temp: 98.1 F (36.7 C) 98.1 F (36.7 C)  Resp: 18 18    General: Elderly 82 year old man who is much more alert today. No acute distress. Cardiovascular: S1, S2, with a soft systolic murmur and  borderline tachycardia. Respiratory: Clear anteriorly with decreased breath sounds in the bases. Musculoskeletal: Mild tenderness over sacrum and pelvis. Patient was able to lift each leg against  gravity minimally, but with some discomfort.  Discharge Instructions   Discharge Instructions    Diet - low sodium heart healthy    Complete by:  As directed      Increase activity slowly    Complete by:  As directed           Current Discharge Medication List    START taking these medications   Details  acetaminophen (TYLENOL) 160 MG/5ML solution Take 15.6 mLs (500 mg total) by mouth every 6 (six) hours as needed for mild pain.    HYDROcodone-acetaminophen (NORCO/VICODIN) 5-325 MG tablet Take 1 tablet by mouth every 6 (six) hours as needed for moderate pain. Qty: 30 tablet, Refills: 0    potassium chloride (K-DUR) 10 MEQ tablet Take 1 tablet (10 mEq total) by mouth daily. Qty: 14 tablet, Refills: 0      CONTINUE these medications which have CHANGED   Details  gabapentin (NEURONTIN) 100 MG capsule Take 1 capsule (100 mg total) by mouth at bedtime.      CONTINUE these medications which have NOT CHANGED   Details  Cholecalciferol (VITAMIN D3) 2000 UNITS capsule Take 2,000 Units by mouth daily.    donepezil (ARICEPT) 10 MG tablet Take 1 tablet (10 mg total) by mouth at bedtime. Do not start until march 2017 Qty: 30 tablet, Refills: 2    feeding supplement, ENSURE ENLIVE, (ENSURE ENLIVE) LIQD Take 237 mLs by mouth 3 (three) times daily between meals. Qty: 237 mL, Refills: 12    levothyroxine (SYNTHROID, LEVOTHROID) 75 MCG tablet Take 1 tablet (75 mcg total) by mouth daily. Qty: 90 tablet, Refills: 3    memantine (NAMENDA) 10 MG tablet Take 1 tablet (10 mg total) by mouth 2 (two) times daily. Qty: 60 tablet, Refills: 2    omeprazole (PRILOSEC) 20 MG capsule Take 1 capsule (20 mg total) by mouth daily. Qty: 30 capsule, Refills: 3       Allergies  Allergen Reactions  . Naproxen Other (See Comments)    unknown  . Sulfa Antibiotics Other (See Comments)    unknown   Follow-up Information    Follow up with Rudi Heap, MD. Schedule an appointment as soon as possible  for a visit in 1 week.   Specialty:  Family Medicine   Contact information:   [address] [place] [address] [phone]        The results of significant diagnostics from this hospitalization (including imaging, microbiology, ancillary and laboratory) are listed below for reference.    Significant Diagnostic Studies: Dg Chest 2 View  02/08/2016  CLINICAL DATA:  Dementia, fall today EXAM: CHEST  2 VIEW COMPARISON:  Chest x-rays dated 08/23/2015 and 09/30/2013. FINDINGS: Lungs are at least mildly hyperexpanded, based on the lateral projection, suggesting COPD. Suspect associated chronic bronchitic changes centrally. Coarse interstitial markings within each lung are likely indicative of an associated chronic interstitial lung disease. No confluent airspace opacity to suggest a developing pneumonia. No pleural effusion or pneumothorax seen. Heart size is normal. Overall cardiomediastinal silhouette remains within normal limits in size and configuration. Osseous and soft tissue structures about the chest are unremarkable. No osseous fracture or dislocation seen. IMPRESSION: No acute findings. No evidence of pneumonia or pleural effusion. No osseous fracture or dislocation seen. Chronic-appearing findings detailed above. Electronically Signed   By: Weyman Croon  Linde Gillis M.D.   On: 02/08/2016 16:25   Dg Pelvis 1-2 Views  02/08/2016  CLINICAL DATA:  Possible fall EXAM: PELVIS - 1-2 VIEW COMPARISON:  08/23/2015 FINDINGS: There is hardware in the proximal left femur, transfixing an healed intertrochanteric femur fracture. No breakage or loosening of the hardware. No acute fracture. Osteopenia. Degenerative changes in the lower lumbar spine. IMPRESSION: No acute bony pathology. Postoperative and chronic changes are noted. Electronically Signed   By: Jolaine Click M.D.   On: 02/08/2016 14:07   Ct Head Wo Contrast  02/08/2016  CLINICAL DATA:  Patient found lying on floor at home. EXAM: CT HEAD WITHOUT  CONTRAST CT CERVICAL SPINE WITHOUT CONTRAST TECHNIQUE: Multidetector CT imaging of the head and cervical spine was performed following the standard protocol without intravenous contrast. Multiplanar CT image reconstructions of the cervical spine were also generated. COMPARISON:  CT scan of head of August 28, 2015. FINDINGS: CT HEAD FINDINGS Bony calvarium appears intact. Moderate diffuse cortical atrophy is noted. Mild chronic ischemic white matter disease is noted. No mass effect or midline shift is noted. Ventricular size is within normal limits. There is no evidence of mass lesion, hemorrhage or acute infarction. CT CERVICAL SPINE FINDINGS No fracture is noted. Grade 1 anterolisthesis of C3-4 is noted secondary to posterior facet joint hypertrophy. Moderate degenerative disc disease is noted at C4-5, C5-6 and C6-7. Grade 1 anterolisthesis of C7-T1 is noted secondary to posterior facet joint hypertrophy. Biapical scarring is noted in the visualized lung fields. IMPRESSION: Moderate diffuse cortical atrophy is noted. Mild chronic ischemic white matter disease is noted. No acute intracranial abnormality seen. Multilevel degenerative disc disease is noted. No acute abnormality seen in the cervical spine. Electronically Signed   By: Lupita Raider, M.D.   On: 02/08/2016 14:47   Ct Cervical Spine Wo Contrast  02/08/2016  CLINICAL DATA:  Patient found lying on floor at home. EXAM: CT HEAD WITHOUT CONTRAST CT CERVICAL SPINE WITHOUT CONTRAST TECHNIQUE: Multidetector CT imaging of the head and cervical spine was performed following the standard protocol without intravenous contrast. Multiplanar CT image reconstructions of the cervical spine were also generated. COMPARISON:  CT scan of head of August 28, 2015. FINDINGS: CT HEAD FINDINGS Bony calvarium appears intact. Moderate diffuse cortical atrophy is noted. Mild chronic ischemic white matter disease is noted. No mass effect or midline shift is noted. Ventricular  size is within normal limits. There is no evidence of mass lesion, hemorrhage or acute infarction. CT CERVICAL SPINE FINDINGS No fracture is noted. Grade 1 anterolisthesis of C3-4 is noted secondary to posterior facet joint hypertrophy. Moderate degenerative disc disease is noted at C4-5, C5-6 and C6-7. Grade 1 anterolisthesis of C7-T1 is noted secondary to posterior facet joint hypertrophy. Biapical scarring is noted in the visualized lung fields. IMPRESSION: Moderate diffuse cortical atrophy is noted. Mild chronic ischemic white matter disease is noted. No acute intracranial abnormality seen. Multilevel degenerative disc disease is noted. No acute abnormality seen in the cervical spine. Electronically Signed   By: Lupita Raider, M.D.   On: 02/08/2016 14:47   Ct Abdomen Pelvis W Contrast  02/08/2016  CLINICAL DATA:  Found on floor unresponsive EXAM: CT ABDOMEN AND PELVIS WITH CONTRAST TECHNIQUE: Multidetector CT imaging of the abdomen and pelvis was performed using the standard protocol following bolus administration of intravenous contrast. CONTRAST:  25mL OMNIPAQUE IOHEXOL 300 MG/ML SOLN, OMNIPAQUE IOHEXOL 300 MG/ML SOLN COMPARISON:  None. FINDINGS: Lung bases are free of acute infiltrate or sizable  effusion. The liver, gallbladder, spleen, adrenal glands and pancreas are within normal limits. The kidneys are well visualized and demonstrates some cystic change. No renal calculi or urinary tract obstructive changes are noted. Aortoiliac calcifications are noted without aneurysmal dilatation. The appendix is not well visualized although no inflammatory changes to suggest appendicitis are noted. Diverticular change is seen without evidence of diverticulitis. The bladder is well distended. The prostate is within normal limits. Postsurgical changes are noted in the right hip. Some pre sacral soft tissue changes are noted related to sacral fractures. These are noted within the sacral body and also appear to  involve the S2 vertebral body specifically with some anterior displacement of the fracture fragment causing the localized hemorrhage. No other acute fracture is seen. Degenerative changes of the lumbar spine are noted as well. IMPRESSION: Bilateral sacral fractures as described with some presacral pelvic hemorrhage identified. No sizable hematoma is noted. Scattered chronic changes as described above. Electronically Signed   By: Alcide CleverMark  Lukens M.D.   On: 02/08/2016 20:30    Microbiology: Recent Results (from the past 240 hour(s))  MRSA PCR Screening     Status: None   Collection Time: 02/09/16  8:59 AM  Result Value Ref Range Status   MRSA by PCR NEGATIVE NEGATIVE Final    Comment:        The GeneXpert MRSA Assay (FDA approved for NASAL specimens only), is one component of a comprehensive MRSA colonization surveillance program. It is not intended to diagnose MRSA infection nor to guide or monitor treatment for MRSA infections.      Labs: Basic Metabolic Panel:  Recent Labs Lab 02/08/16 1320 02/12/16 0920 02/13/16 0607  NA 138 136 138  K 4.5 3.5 3.4*  CL 101 105 104  CO2 26 24 27   GLUCOSE 166* 145* 110*  BUN 20 14 15   CREATININE 1.18 0.96 0.98  CALCIUM 8.8* 8.0* 8.2*   Liver Function Tests:  Recent Labs Lab 02/08/16 1320 02/12/16 0920  AST 50* 25  ALT 45 22  ALKPHOS 181* 117  BILITOT 1.0 1.2  PROT 7.0 6.2*  ALBUMIN 3.4* 2.8*    Recent Labs Lab 02/08/16 1320  LIPASE 19   No results for input(s): AMMONIA in the last 168 hours. CBC:  Recent Labs Lab 02/08/16 1320 02/12/16 0920 02/13/16 0607  WBC 18.6* 10.9* 8.3  NEUTROABS 16.6*  --   --   HGB 13.1 11.7* 11.7*  HCT 39.1 34.4* 35.2*  MCV 96.1 94.5 95.7  PLT 158 169 193   Cardiac Enzymes:  Recent Labs Lab 02/08/16 1320 02/09/16 0114 02/09/16 0716 02/09/16 1358 02/12/16 0920  CKTOTAL  --   --   --   --  122  TROPONINI 0.06* 0.07* 0.05* 0.05* <0.03   BNP: BNP (last 3 results) No results for  input(s): BNP in the last 8760 hours.  ProBNP (last 3 results) No results for input(s): PROBNP in the last 8760 hours.  CBG: No results for input(s): GLUCAP in the last 168 hours.     Signed:  Lyriq Finerty MD.  [facility] 02/13/2016, 12:17 PM

## 2016-02-13 NOTE — Progress Notes (Signed)
Physical Therapy Treatment Patient Details Name: Joseph [name] MRN: [id number] DOB: 1926/09/28 Today's Date: 02/13/2016    History of Present Illness Joseph [name] is an 82yo white male who comes to [facility] on 3/30 after sustaining a fall at home. Upon arrival pt found to have bilateral sacral fractures, which ortho has consulted on and described as non-operative, recommending 'WBAT with assistance.' The patient sustained a fall back in [place] 2016 requriing hip ORIF, and was at [facility] from October to January. At baseline, pt has moderate-severe cognitive deficits due to alzheimer's dementia, which are interpreted by this PT as described by daughter as moderate language deficits (requiring gestures to best follow commands) and not recognizing family for some time now. Pt requires  total care for ADL, except for self feeding. Daughter tells this therapist that since pt retruned home from SNF she has been ambulating with her father up and down the hall with a walker and that he assists in pivoting to the commode. This is different from what daughter verbalized to the first therapist.  There is a aide who comes to the home twice a week for two hours at a time.    PT Comments    Pt awake and wearing hand mittens upon arrival. He was AxO to person only, but was aware that he is not currently at home. Pt was willing to cooperate with therapist and his wife and daughter were supportive and eager for him to participate in therapy. Daughter began recording session, however she was unable to continue throughout the session as her assistance was needed during the session. Pt was unable to rate his pain, however he would occasionally verbalize pain responses throughout the session. Therapist monitored this for the remainder of the session. His bed mobility is improved from his initial eval, requiring ModA to MaxA to move supine to/from sit. He continues to demonstrate posterior lean during sitting EOB, however he does not require  as much assistance compare to his previous evaluation. Pt requesting to toilet during session, requiring ModA to MaxA to stand pivot transfer to/from [facility]. Therapist noting pt able to assist more with transfers when using HHA as opposed to his RW with improved ability to follow directions. Overall, pt is somewhat inconsistent at following simple and complex commands, however he responded fairly well to hand assist with tasks performed. Discussed session with pt's wife and daughter, voicing concern about his ability to meet the demands of high intensity skilled rehab at a SNF and recommended HHPT to address his rehab needs in a more familiar environment to decrease caregiver burden. Also noted his need for supervision/assist throughout the day if he were to return home as he is unsafe to get up on his own. Family voiced understanding at this time and appreciative of his progress so far. Pt will continue to benefit from skilled PT while at [facility] to address his limitations in strength, mobility and transfers. G-codes and PT frequency were updated this session to reflect current progress.   Follow Up Recommendations  Supervision/Assistance - 24 hour;Home health PT     Equipment Recommendations  None recommended by PT    Recommendations for Other Services       Precautions / Restrictions Precautions Precautions: Fall Precaution Comments: Big time falls risk. No score available.  Restrictions Weight Bearing Restrictions: Yes Other Position/Activity Restrictions: WBAT    Mobility  Bed Mobility Overal bed mobility: Needs Assistance Bed Mobility: Supine to Sit;Sit to Supine     Supine to sit:  Mod assist Sit to supine: Leeam assist   General bed mobility comments: Pt requiring assistance with moving sit to supine from EOB, therapist noting retropulsion and requiring maxA to prevent posteior collapse; daughter assisting with gettting pt comfortable after return to supine position.   Transfers Overall  transfer level: Needs assistance Equipment used: Rolling walker (2 wheeled);1 person hand held assist (initially attempted RW, however pt with increased difficulty with UE placement and therapist adjusting with HHA ) Transfers: Sit to/from Stand;Stand Pivot Transfers Sit to Stand: Zamauri assist;Mod assist (MaxA from EOB, [facility] from [facility]) Stand pivot transfers: Elija assist;Mod assist       General transfer comment: Pt demonstrating improved sit to stand from hard surface; Performed sit/stand x3 with ModA from [facility] to allow his daughter to clean him  Ambulation/Gait Ambulation/Gait assistance:  (not attempted this session due to pt fatigue and pain)               Stairs            Wheelchair Mobility    Modified Rankin (Stroke Patients Only)       Balance Overall balance assessment: Needs assistance Sitting-balance support: Bilateral upper extremity supported;Feet supported Sitting balance-Leahy Scale: Poor Sitting balance - Comments: Excessive retropulsion noted initially with sitting EOB, however this improved some with therapist only providing MinA to ModA to prevent posterior collapse Postural control: Posterior lean Standing balance support: Bilateral upper extremity supported Standing balance-Leahy Scale: Zero Standing balance comment: Pt with increased trunk flexion, knee flexion; unable to maneuver walker during transfer from bed to [facility], but able to follow commands for HHA transfer                    Cognition Arousal/Alertness: Awake/alert Behavior During Therapy: [facility]FL for tasks assessed/performed;Impulsive Overall Cognitive Status: History of cognitive impairments - at baseline       Memory: Decreased short-term memory;Decreased recall of precautions              Exercises General Exercises - Lower Extremity Ankle Circles/Pumps: Other (comment) (provided demonstration to pt's daughter for him to perform throughout the day.) Heel Slides: Other  (comment) (provided demonstration to pt's daughter for him to perform throughout the day.)    General Comments        Pertinent Vitals/Pain Pain Assessment: Faces Faces Pain Scale: Hurts even more (during activity) Pain Intervention(s): Limited activity within patient's tolerance    Home Living                      Prior Function            PT Goals (current goals can now be found in the care plan section)      Frequency  Min 3X/week    PT Plan Frequency needs to be updated (D/C plan remains appropriate)    Co-evaluation             End of Session Equipment Utilized During Treatment: Gait belt Activity Tolerance: Patient limited by pain;Patient limited by fatigue Patient left: in bed;with family/visitor present;with call bell/phone within reach;with bed alarm set     Time: 0950-1030 PT Time Calculation (min) (ACUTE ONLY): 40 min  Charges:  $Therapeutic Activity: 23-37 mins $Self Care/Home Management: 8-22                    G Codes:  Functional Assessment Tool Used: Clinical judgement based on assessment of mobility and strength Functional Limitation: Mobility:  Walking and moving around;Changing and maintaining body position Mobility: Walking and Moving Around Current Status [phone]): At least 80 percent but less than 100 percent impaired, limited or restricted Mobility: Walking and Moving Around Goal Status [phone]): At least 60 percent but less than 80 percent impaired, limited or restricted Changing and Maintaining Body Position Current Status (U9811): At least 80 percent but less than 100 percent impaired, limited or restricted Changing and Maintaining Body Position Goal Status (B1478): At least 60 percent but less than 80 percent impaired, limited or restricted   11:49 AM,02/13/2016 Marylyn Ishihara PT, DPT [facility] [phone]

## 2016-02-13 NOTE — Care Management Note (Signed)
Case Management Note  Patient Details  Name: Joseph GaudierMax P Bench MRN: [id number] Date of Birth: November 29, 1925  Subjective/Objective:                  Pt is from home, lives with his wife and requires assistance with ADL's. Per pt's daughter pt is able to transfer but spends most of his time in his wheelchair. Pt has hospital bed and BSC. Pt has fallen, suffered a fx and pt's daughter would like for him to go to STR. PT has recommended HH PT, pt's daughter is agreeable and understands her father is appropriate for LTC but not STR. [facility]'s [facility] Daughter is aware HH has 48 hours to resume services.  Action/Plan: Pt discharging home today with resumption of HH services. Alroy BailiffLinda Lothian, of [facility], made aware of resumption and will obtain pt info from chart. EMS will be arranged for transport home. No further CM needs.   Expected Discharge Date:    02/13/2016              Expected Discharge Plan:  Home w Home Health Services  In-House Referral:  Clinical Social Work  Discharge planning Services  CM Consult  Post Acute Care Choice:  Home Health, Resumption of Svcs/PTA Provider Choice offered to:  Adult Children  DME Arranged:    DME Agency:     HH Arranged:  RN, PT, Nurse's Aide, Social Work [facility] Agency:  [facility]  Status of Service:  Completed, signed off  Medicare Important Message Given:  Yes Date Medicare IM Given:    Medicare IM give by:    Date Additional Medicare IM Given:    Additional Medicare Important Message give by:     If discussed at Long Length of Stay Meetings, dates discussed:  02/13/2016  Additional Comments:  Malcolm MetroChildress, Shaketha Jeon Demske, RN 02/13/2016, 2:16 PM

## 2016-02-13 NOTE — Consult Note (Addendum)
   [facility]   02/13/2016  Taequan Fulton Mole Hoen 1926/05/14 [id number]  Patient screened for potential [facility] Care Management services. Patient is eligible for [facility] Services. EPIC reviewed and case discussed with hospital Case Manager there were no identifiable [facility] care [facility] needs at this time. [facility] services not appropriate at this time. If patient's post hospital needs change please place a [facility] consult.  For questions please contact:   Alben SpittleMary E. Albertha GheeNiemczura, RN, BSN, [facility] ManagementCCM   Hospital Liaison [facility] [phone]) Business Cell  [phone]) Toll Free Office

## 2016-02-14 ENCOUNTER — Other Ambulatory Visit: Payer: Self-pay | Admitting: Family Medicine

## 2016-02-14 DIAGNOSIS — G629 Polyneuropathy, unspecified: Secondary | ICD-10-CM | POA: Diagnosis not present

## 2016-02-14 DIAGNOSIS — L89213 Pressure ulcer of right hip, stage 3: Secondary | ICD-10-CM | POA: Diagnosis not present

## 2016-02-14 DIAGNOSIS — F0281 Dementia in other diseases classified elsewhere with behavioral disturbance: Secondary | ICD-10-CM | POA: Diagnosis not present

## 2016-02-14 DIAGNOSIS — N183 Chronic kidney disease, stage 3 (moderate): Secondary | ICD-10-CM | POA: Diagnosis not present

## 2016-02-14 DIAGNOSIS — L89624 Pressure ulcer of left heel, stage 4: Secondary | ICD-10-CM | POA: Diagnosis not present

## 2016-02-14 DIAGNOSIS — G309 Alzheimer's disease, unspecified: Secondary | ICD-10-CM | POA: Diagnosis not present

## 2016-02-15 DIAGNOSIS — N183 Chronic kidney disease, stage 3 (moderate): Secondary | ICD-10-CM | POA: Diagnosis not present

## 2016-02-15 DIAGNOSIS — G629 Polyneuropathy, unspecified: Secondary | ICD-10-CM | POA: Diagnosis not present

## 2016-02-15 DIAGNOSIS — L89624 Pressure ulcer of left heel, stage 4: Secondary | ICD-10-CM | POA: Diagnosis not present

## 2016-02-15 DIAGNOSIS — G309 Alzheimer's disease, unspecified: Secondary | ICD-10-CM | POA: Diagnosis not present

## 2016-02-15 DIAGNOSIS — L89213 Pressure ulcer of right hip, stage 3: Secondary | ICD-10-CM | POA: Diagnosis not present

## 2016-02-15 DIAGNOSIS — F0281 Dementia in other diseases classified elsewhere with behavioral disturbance: Secondary | ICD-10-CM | POA: Diagnosis not present

## 2016-02-16 DIAGNOSIS — G309 Alzheimer's disease, unspecified: Secondary | ICD-10-CM | POA: Diagnosis not present

## 2016-02-16 DIAGNOSIS — F0281 Dementia in other diseases classified elsewhere with behavioral disturbance: Secondary | ICD-10-CM | POA: Diagnosis not present

## 2016-02-16 DIAGNOSIS — N183 Chronic kidney disease, stage 3 (moderate): Secondary | ICD-10-CM | POA: Diagnosis not present

## 2016-02-16 DIAGNOSIS — G629 Polyneuropathy, unspecified: Secondary | ICD-10-CM | POA: Diagnosis not present

## 2016-02-16 DIAGNOSIS — L89213 Pressure ulcer of right hip, stage 3: Secondary | ICD-10-CM | POA: Diagnosis not present

## 2016-02-16 DIAGNOSIS — L89624 Pressure ulcer of left heel, stage 4: Secondary | ICD-10-CM | POA: Diagnosis not present

## 2016-02-19 DIAGNOSIS — R739 Hyperglycemia, unspecified: Secondary | ICD-10-CM | POA: Diagnosis not present

## 2016-02-19 DIAGNOSIS — E162 Hypoglycemia, unspecified: Secondary | ICD-10-CM | POA: Diagnosis not present

## 2016-02-19 DIAGNOSIS — Z9181 History of falling: Secondary | ICD-10-CM | POA: Diagnosis not present

## 2016-02-19 DIAGNOSIS — R531 Weakness: Secondary | ICD-10-CM | POA: Diagnosis not present

## 2016-02-19 DIAGNOSIS — R7989 Other specified abnormal findings of blood chemistry: Secondary | ICD-10-CM | POA: Diagnosis not present

## 2016-02-19 DIAGNOSIS — Z8781 Personal history of (healed) traumatic fracture: Secondary | ICD-10-CM | POA: Diagnosis not present

## 2016-02-19 DIAGNOSIS — R269 Unspecified abnormalities of gait and mobility: Secondary | ICD-10-CM | POA: Diagnosis not present

## 2016-02-19 DIAGNOSIS — E039 Hypothyroidism, unspecified: Secondary | ICD-10-CM | POA: Diagnosis not present

## 2016-02-19 DIAGNOSIS — L89213 Pressure ulcer of right hip, stage 3: Secondary | ICD-10-CM | POA: Diagnosis not present

## 2016-02-19 DIAGNOSIS — D631 Anemia in chronic kidney disease: Secondary | ICD-10-CM | POA: Diagnosis not present

## 2016-02-19 DIAGNOSIS — S3210XB Unspecified fracture of sacrum, initial encounter for open fracture: Secondary | ICD-10-CM | POA: Diagnosis not present

## 2016-02-19 DIAGNOSIS — M6281 Muscle weakness (generalized): Secondary | ICD-10-CM | POA: Diagnosis not present

## 2016-02-19 DIAGNOSIS — K219 Gastro-esophageal reflux disease without esophagitis: Secondary | ICD-10-CM | POA: Diagnosis not present

## 2016-02-19 DIAGNOSIS — E785 Hyperlipidemia, unspecified: Secondary | ICD-10-CM | POA: Diagnosis not present

## 2016-02-19 DIAGNOSIS — E559 Vitamin D deficiency, unspecified: Secondary | ICD-10-CM | POA: Diagnosis not present

## 2016-02-19 DIAGNOSIS — R41841 Cognitive communication deficit: Secondary | ICD-10-CM | POA: Diagnosis not present

## 2016-02-19 DIAGNOSIS — G309 Alzheimer's disease, unspecified: Secondary | ICD-10-CM | POA: Diagnosis not present

## 2016-02-19 DIAGNOSIS — D649 Anemia, unspecified: Secondary | ICD-10-CM | POA: Diagnosis not present

## 2016-02-19 DIAGNOSIS — F0390 Unspecified dementia without behavioral disturbance: Secondary | ICD-10-CM | POA: Diagnosis not present

## 2016-02-19 DIAGNOSIS — R1311 Dysphagia, oral phase: Secondary | ICD-10-CM | POA: Diagnosis not present

## 2016-02-19 DIAGNOSIS — Z79899 Other long term (current) drug therapy: Secondary | ICD-10-CM | POA: Diagnosis not present

## 2016-02-19 DIAGNOSIS — E876 Hypokalemia: Secondary | ICD-10-CM | POA: Diagnosis not present

## 2016-02-19 DIAGNOSIS — S3210XD Unspecified fracture of sacrum, subsequent encounter for fracture with routine healing: Secondary | ICD-10-CM | POA: Diagnosis not present

## 2016-02-19 DIAGNOSIS — E0590 Thyrotoxicosis, unspecified without thyrotoxic crisis or storm: Secondary | ICD-10-CM | POA: Diagnosis not present

## 2016-02-19 DIAGNOSIS — N183 Chronic kidney disease, stage 3 (moderate): Secondary | ICD-10-CM | POA: Diagnosis not present

## 2016-02-19 DIAGNOSIS — D62 Acute posthemorrhagic anemia: Secondary | ICD-10-CM | POA: Diagnosis not present

## 2016-02-19 DIAGNOSIS — N401 Enlarged prostate with lower urinary tract symptoms: Secondary | ICD-10-CM | POA: Diagnosis not present

## 2016-02-19 DIAGNOSIS — F0280 Dementia in other diseases classified elsewhere without behavioral disturbance: Secondary | ICD-10-CM | POA: Diagnosis not present

## 2016-02-19 DIAGNOSIS — M7981 Nontraumatic hematoma of soft tissue: Secondary | ICD-10-CM | POA: Diagnosis not present

## 2016-02-19 DIAGNOSIS — G629 Polyneuropathy, unspecified: Secondary | ICD-10-CM | POA: Diagnosis not present

## 2016-02-19 DIAGNOSIS — F0281 Dementia in other diseases classified elsewhere with behavioral disturbance: Secondary | ICD-10-CM | POA: Diagnosis not present

## 2016-02-19 DIAGNOSIS — L89624 Pressure ulcer of left heel, stage 4: Secondary | ICD-10-CM | POA: Diagnosis not present

## 2016-02-19 DIAGNOSIS — G9340 Encephalopathy, unspecified: Secondary | ICD-10-CM | POA: Diagnosis not present

## 2016-02-20 NOTE — Telephone Encounter (Signed)
Pt in nursing facility now

## 2016-02-21 DIAGNOSIS — R531 Weakness: Secondary | ICD-10-CM | POA: Diagnosis not present

## 2016-02-21 DIAGNOSIS — E039 Hypothyroidism, unspecified: Secondary | ICD-10-CM | POA: Diagnosis not present

## 2016-02-21 DIAGNOSIS — D631 Anemia in chronic kidney disease: Secondary | ICD-10-CM | POA: Diagnosis not present

## 2016-03-05 ENCOUNTER — Ambulatory Visit: Payer: Medicare Other | Admitting: Family Medicine

## 2016-03-18 ENCOUNTER — Ambulatory Visit: Payer: Medicare Other | Admitting: Family Medicine

## 2016-03-18 DIAGNOSIS — E876 Hypokalemia: Secondary | ICD-10-CM | POA: Diagnosis not present

## 2016-03-18 DIAGNOSIS — E039 Hypothyroidism, unspecified: Secondary | ICD-10-CM | POA: Diagnosis not present

## 2016-03-18 DIAGNOSIS — E162 Hypoglycemia, unspecified: Secondary | ICD-10-CM | POA: Diagnosis not present

## 2016-04-15 DIAGNOSIS — R531 Weakness: Secondary | ICD-10-CM | POA: Diagnosis not present

## 2016-04-15 DIAGNOSIS — E039 Hypothyroidism, unspecified: Secondary | ICD-10-CM | POA: Diagnosis not present

## 2016-04-15 DIAGNOSIS — R269 Unspecified abnormalities of gait and mobility: Secondary | ICD-10-CM | POA: Diagnosis not present

## 2016-04-15 DIAGNOSIS — G9340 Encephalopathy, unspecified: Secondary | ICD-10-CM | POA: Diagnosis not present

## 2016-04-29 ENCOUNTER — Ambulatory Visit: Payer: Medicare Other | Admitting: Family Medicine

## 2016-05-15 ENCOUNTER — Ambulatory Visit: Payer: Medicare Other | Admitting: Family Medicine

## 2016-05-27 DIAGNOSIS — S3210XB Unspecified fracture of sacrum, initial encounter for open fracture: Secondary | ICD-10-CM | POA: Diagnosis not present

## 2016-05-27 DIAGNOSIS — F0280 Dementia in other diseases classified elsewhere without behavioral disturbance: Secondary | ICD-10-CM | POA: Diagnosis not present

## 2016-05-27 DIAGNOSIS — R269 Unspecified abnormalities of gait and mobility: Secondary | ICD-10-CM | POA: Diagnosis not present

## 2016-05-27 DIAGNOSIS — E039 Hypothyroidism, unspecified: Secondary | ICD-10-CM | POA: Diagnosis not present

## 2016-06-12 ENCOUNTER — Ambulatory Visit: Payer: Medicare Other | Admitting: Family Medicine

## 2016-06-12 DIAGNOSIS — G309 Alzheimer's disease, unspecified: Secondary | ICD-10-CM | POA: Diagnosis not present

## 2016-06-12 DIAGNOSIS — E559 Vitamin D deficiency, unspecified: Secondary | ICD-10-CM | POA: Diagnosis not present

## 2016-06-12 DIAGNOSIS — R2681 Unsteadiness on feet: Secondary | ICD-10-CM | POA: Diagnosis not present

## 2016-06-12 DIAGNOSIS — F329 Major depressive disorder, single episode, unspecified: Secondary | ICD-10-CM | POA: Diagnosis not present

## 2016-06-12 DIAGNOSIS — E039 Hypothyroidism, unspecified: Secondary | ICD-10-CM | POA: Diagnosis not present

## 2016-06-12 DIAGNOSIS — L89624 Pressure ulcer of left heel, stage 4: Secondary | ICD-10-CM | POA: Diagnosis not present

## 2016-06-12 DIAGNOSIS — R1310 Dysphagia, unspecified: Secondary | ICD-10-CM | POA: Diagnosis not present

## 2016-06-12 DIAGNOSIS — N183 Chronic kidney disease, stage 3 (moderate): Secondary | ICD-10-CM | POA: Diagnosis not present

## 2016-06-12 DIAGNOSIS — Z9181 History of falling: Secondary | ICD-10-CM | POA: Diagnosis not present

## 2016-06-12 DIAGNOSIS — G629 Polyneuropathy, unspecified: Secondary | ICD-10-CM | POA: Diagnosis not present

## 2016-06-12 DIAGNOSIS — N400 Enlarged prostate without lower urinary tract symptoms: Secondary | ICD-10-CM | POA: Diagnosis not present

## 2016-06-12 DIAGNOSIS — K219 Gastro-esophageal reflux disease without esophagitis: Secondary | ICD-10-CM | POA: Diagnosis not present

## 2016-06-12 DIAGNOSIS — F0281 Dementia in other diseases classified elsewhere with behavioral disturbance: Secondary | ICD-10-CM | POA: Diagnosis not present

## 2016-06-13 ENCOUNTER — Encounter: Payer: Self-pay | Admitting: Family Medicine

## 2016-06-13 DIAGNOSIS — R2681 Unsteadiness on feet: Secondary | ICD-10-CM | POA: Diagnosis not present

## 2016-06-13 DIAGNOSIS — F0281 Dementia in other diseases classified elsewhere with behavioral disturbance: Secondary | ICD-10-CM | POA: Diagnosis not present

## 2016-06-13 DIAGNOSIS — R1310 Dysphagia, unspecified: Secondary | ICD-10-CM | POA: Diagnosis not present

## 2016-06-13 DIAGNOSIS — G629 Polyneuropathy, unspecified: Secondary | ICD-10-CM | POA: Diagnosis not present

## 2016-06-13 DIAGNOSIS — N183 Chronic kidney disease, stage 3 (moderate): Secondary | ICD-10-CM | POA: Diagnosis not present

## 2016-06-13 DIAGNOSIS — G309 Alzheimer's disease, unspecified: Secondary | ICD-10-CM | POA: Diagnosis not present

## 2016-06-14 DIAGNOSIS — F0281 Dementia in other diseases classified elsewhere with behavioral disturbance: Secondary | ICD-10-CM | POA: Diagnosis not present

## 2016-06-14 DIAGNOSIS — R2681 Unsteadiness on feet: Secondary | ICD-10-CM | POA: Diagnosis not present

## 2016-06-14 DIAGNOSIS — R1310 Dysphagia, unspecified: Secondary | ICD-10-CM | POA: Diagnosis not present

## 2016-06-14 DIAGNOSIS — G629 Polyneuropathy, unspecified: Secondary | ICD-10-CM | POA: Diagnosis not present

## 2016-06-14 DIAGNOSIS — G309 Alzheimer's disease, unspecified: Secondary | ICD-10-CM | POA: Diagnosis not present

## 2016-06-14 DIAGNOSIS — N183 Chronic kidney disease, stage 3 (moderate): Secondary | ICD-10-CM | POA: Diagnosis not present

## 2016-06-17 DIAGNOSIS — G309 Alzheimer's disease, unspecified: Secondary | ICD-10-CM | POA: Diagnosis not present

## 2016-06-17 DIAGNOSIS — R1310 Dysphagia, unspecified: Secondary | ICD-10-CM | POA: Diagnosis not present

## 2016-06-17 DIAGNOSIS — F0281 Dementia in other diseases classified elsewhere with behavioral disturbance: Secondary | ICD-10-CM | POA: Diagnosis not present

## 2016-06-17 DIAGNOSIS — G629 Polyneuropathy, unspecified: Secondary | ICD-10-CM | POA: Diagnosis not present

## 2016-06-17 DIAGNOSIS — N183 Chronic kidney disease, stage 3 (moderate): Secondary | ICD-10-CM | POA: Diagnosis not present

## 2016-06-17 DIAGNOSIS — R2681 Unsteadiness on feet: Secondary | ICD-10-CM | POA: Diagnosis not present

## 2016-06-18 DIAGNOSIS — N183 Chronic kidney disease, stage 3 (moderate): Secondary | ICD-10-CM | POA: Diagnosis not present

## 2016-06-18 DIAGNOSIS — R2681 Unsteadiness on feet: Secondary | ICD-10-CM | POA: Diagnosis not present

## 2016-06-18 DIAGNOSIS — F0281 Dementia in other diseases classified elsewhere with behavioral disturbance: Secondary | ICD-10-CM | POA: Diagnosis not present

## 2016-06-18 DIAGNOSIS — G629 Polyneuropathy, unspecified: Secondary | ICD-10-CM | POA: Diagnosis not present

## 2016-06-18 DIAGNOSIS — R1310 Dysphagia, unspecified: Secondary | ICD-10-CM | POA: Diagnosis not present

## 2016-06-18 DIAGNOSIS — G309 Alzheimer's disease, unspecified: Secondary | ICD-10-CM | POA: Diagnosis not present

## 2016-06-20 ENCOUNTER — Telehealth: Payer: Self-pay | Admitting: Family Medicine

## 2016-06-20 DIAGNOSIS — R1310 Dysphagia, unspecified: Secondary | ICD-10-CM | POA: Diagnosis not present

## 2016-06-20 DIAGNOSIS — F0281 Dementia in other diseases classified elsewhere with behavioral disturbance: Secondary | ICD-10-CM | POA: Diagnosis not present

## 2016-06-20 DIAGNOSIS — G309 Alzheimer's disease, unspecified: Secondary | ICD-10-CM | POA: Diagnosis not present

## 2016-06-20 DIAGNOSIS — N183 Chronic kidney disease, stage 3 (moderate): Secondary | ICD-10-CM | POA: Diagnosis not present

## 2016-06-20 DIAGNOSIS — R2681 Unsteadiness on feet: Secondary | ICD-10-CM | POA: Diagnosis not present

## 2016-06-20 DIAGNOSIS — G629 Polyneuropathy, unspecified: Secondary | ICD-10-CM | POA: Diagnosis not present

## 2016-06-20 NOTE — Telephone Encounter (Signed)
Please review

## 2016-06-21 DIAGNOSIS — G629 Polyneuropathy, unspecified: Secondary | ICD-10-CM | POA: Diagnosis not present

## 2016-06-21 DIAGNOSIS — R2681 Unsteadiness on feet: Secondary | ICD-10-CM | POA: Diagnosis not present

## 2016-06-21 DIAGNOSIS — F0281 Dementia in other diseases classified elsewhere with behavioral disturbance: Secondary | ICD-10-CM | POA: Diagnosis not present

## 2016-06-21 DIAGNOSIS — R1310 Dysphagia, unspecified: Secondary | ICD-10-CM | POA: Diagnosis not present

## 2016-06-21 DIAGNOSIS — G309 Alzheimer's disease, unspecified: Secondary | ICD-10-CM | POA: Diagnosis not present

## 2016-06-21 DIAGNOSIS — N183 Chronic kidney disease, stage 3 (moderate): Secondary | ICD-10-CM | POA: Diagnosis not present

## 2016-06-21 NOTE — Telephone Encounter (Signed)
Pt daughter called and home visit appt - set up

## 2016-06-24 DIAGNOSIS — R1310 Dysphagia, unspecified: Secondary | ICD-10-CM | POA: Diagnosis not present

## 2016-06-24 DIAGNOSIS — G629 Polyneuropathy, unspecified: Secondary | ICD-10-CM | POA: Diagnosis not present

## 2016-06-24 DIAGNOSIS — N183 Chronic kidney disease, stage 3 (moderate): Secondary | ICD-10-CM | POA: Diagnosis not present

## 2016-06-24 DIAGNOSIS — G309 Alzheimer's disease, unspecified: Secondary | ICD-10-CM | POA: Diagnosis not present

## 2016-06-24 DIAGNOSIS — R2681 Unsteadiness on feet: Secondary | ICD-10-CM | POA: Diagnosis not present

## 2016-06-24 DIAGNOSIS — F0281 Dementia in other diseases classified elsewhere with behavioral disturbance: Secondary | ICD-10-CM | POA: Diagnosis not present

## 2016-06-25 DIAGNOSIS — G629 Polyneuropathy, unspecified: Secondary | ICD-10-CM | POA: Diagnosis not present

## 2016-06-25 DIAGNOSIS — R2681 Unsteadiness on feet: Secondary | ICD-10-CM | POA: Diagnosis not present

## 2016-06-25 DIAGNOSIS — G309 Alzheimer's disease, unspecified: Secondary | ICD-10-CM | POA: Diagnosis not present

## 2016-06-25 DIAGNOSIS — F0281 Dementia in other diseases classified elsewhere with behavioral disturbance: Secondary | ICD-10-CM | POA: Diagnosis not present

## 2016-06-25 DIAGNOSIS — R1310 Dysphagia, unspecified: Secondary | ICD-10-CM | POA: Diagnosis not present

## 2016-06-25 DIAGNOSIS — N183 Chronic kidney disease, stage 3 (moderate): Secondary | ICD-10-CM | POA: Diagnosis not present

## 2016-06-27 DIAGNOSIS — G309 Alzheimer's disease, unspecified: Secondary | ICD-10-CM | POA: Diagnosis not present

## 2016-06-27 DIAGNOSIS — N183 Chronic kidney disease, stage 3 (moderate): Secondary | ICD-10-CM | POA: Diagnosis not present

## 2016-06-27 DIAGNOSIS — F0281 Dementia in other diseases classified elsewhere with behavioral disturbance: Secondary | ICD-10-CM | POA: Diagnosis not present

## 2016-06-27 DIAGNOSIS — G629 Polyneuropathy, unspecified: Secondary | ICD-10-CM | POA: Diagnosis not present

## 2016-06-27 DIAGNOSIS — R1310 Dysphagia, unspecified: Secondary | ICD-10-CM | POA: Diagnosis not present

## 2016-06-27 DIAGNOSIS — R2681 Unsteadiness on feet: Secondary | ICD-10-CM | POA: Diagnosis not present

## 2016-06-28 DIAGNOSIS — R1310 Dysphagia, unspecified: Secondary | ICD-10-CM | POA: Diagnosis not present

## 2016-06-28 DIAGNOSIS — R2681 Unsteadiness on feet: Secondary | ICD-10-CM | POA: Diagnosis not present

## 2016-06-28 DIAGNOSIS — F0281 Dementia in other diseases classified elsewhere with behavioral disturbance: Secondary | ICD-10-CM | POA: Diagnosis not present

## 2016-06-28 DIAGNOSIS — G309 Alzheimer's disease, unspecified: Secondary | ICD-10-CM | POA: Diagnosis not present

## 2016-06-28 DIAGNOSIS — G629 Polyneuropathy, unspecified: Secondary | ICD-10-CM | POA: Diagnosis not present

## 2016-06-28 DIAGNOSIS — N183 Chronic kidney disease, stage 3 (moderate): Secondary | ICD-10-CM | POA: Diagnosis not present

## 2016-07-01 DIAGNOSIS — R1310 Dysphagia, unspecified: Secondary | ICD-10-CM | POA: Diagnosis not present

## 2016-07-01 DIAGNOSIS — G309 Alzheimer's disease, unspecified: Secondary | ICD-10-CM | POA: Diagnosis not present

## 2016-07-01 DIAGNOSIS — G629 Polyneuropathy, unspecified: Secondary | ICD-10-CM | POA: Diagnosis not present

## 2016-07-01 DIAGNOSIS — F0281 Dementia in other diseases classified elsewhere with behavioral disturbance: Secondary | ICD-10-CM | POA: Diagnosis not present

## 2016-07-01 DIAGNOSIS — R2681 Unsteadiness on feet: Secondary | ICD-10-CM | POA: Diagnosis not present

## 2016-07-01 DIAGNOSIS — N183 Chronic kidney disease, stage 3 (moderate): Secondary | ICD-10-CM | POA: Diagnosis not present

## 2016-07-02 DIAGNOSIS — F0281 Dementia in other diseases classified elsewhere with behavioral disturbance: Secondary | ICD-10-CM | POA: Diagnosis not present

## 2016-07-02 DIAGNOSIS — R1310 Dysphagia, unspecified: Secondary | ICD-10-CM | POA: Diagnosis not present

## 2016-07-02 DIAGNOSIS — G629 Polyneuropathy, unspecified: Secondary | ICD-10-CM | POA: Diagnosis not present

## 2016-07-02 DIAGNOSIS — R2681 Unsteadiness on feet: Secondary | ICD-10-CM | POA: Diagnosis not present

## 2016-07-02 DIAGNOSIS — G309 Alzheimer's disease, unspecified: Secondary | ICD-10-CM | POA: Diagnosis not present

## 2016-07-02 DIAGNOSIS — N183 Chronic kidney disease, stage 3 (moderate): Secondary | ICD-10-CM | POA: Diagnosis not present

## 2016-07-04 DIAGNOSIS — G629 Polyneuropathy, unspecified: Secondary | ICD-10-CM | POA: Diagnosis not present

## 2016-07-04 DIAGNOSIS — G309 Alzheimer's disease, unspecified: Secondary | ICD-10-CM | POA: Diagnosis not present

## 2016-07-04 DIAGNOSIS — R2681 Unsteadiness on feet: Secondary | ICD-10-CM | POA: Diagnosis not present

## 2016-07-04 DIAGNOSIS — N183 Chronic kidney disease, stage 3 (moderate): Secondary | ICD-10-CM | POA: Diagnosis not present

## 2016-07-04 DIAGNOSIS — R1310 Dysphagia, unspecified: Secondary | ICD-10-CM | POA: Diagnosis not present

## 2016-07-04 DIAGNOSIS — F0281 Dementia in other diseases classified elsewhere with behavioral disturbance: Secondary | ICD-10-CM | POA: Diagnosis not present

## 2016-07-09 DIAGNOSIS — F0281 Dementia in other diseases classified elsewhere with behavioral disturbance: Secondary | ICD-10-CM | POA: Diagnosis not present

## 2016-07-09 DIAGNOSIS — G629 Polyneuropathy, unspecified: Secondary | ICD-10-CM | POA: Diagnosis not present

## 2016-07-09 DIAGNOSIS — N183 Chronic kidney disease, stage 3 (moderate): Secondary | ICD-10-CM | POA: Diagnosis not present

## 2016-07-09 DIAGNOSIS — R2681 Unsteadiness on feet: Secondary | ICD-10-CM | POA: Diagnosis not present

## 2016-07-09 DIAGNOSIS — G309 Alzheimer's disease, unspecified: Secondary | ICD-10-CM | POA: Diagnosis not present

## 2016-07-09 DIAGNOSIS — R1310 Dysphagia, unspecified: Secondary | ICD-10-CM | POA: Diagnosis not present

## 2016-07-10 DIAGNOSIS — R2681 Unsteadiness on feet: Secondary | ICD-10-CM | POA: Diagnosis not present

## 2016-07-10 DIAGNOSIS — N183 Chronic kidney disease, stage 3 (moderate): Secondary | ICD-10-CM | POA: Diagnosis not present

## 2016-07-10 DIAGNOSIS — R1310 Dysphagia, unspecified: Secondary | ICD-10-CM | POA: Diagnosis not present

## 2016-07-10 DIAGNOSIS — G629 Polyneuropathy, unspecified: Secondary | ICD-10-CM | POA: Diagnosis not present

## 2016-07-10 DIAGNOSIS — G309 Alzheimer's disease, unspecified: Secondary | ICD-10-CM | POA: Diagnosis not present

## 2016-07-10 DIAGNOSIS — F0281 Dementia in other diseases classified elsewhere with behavioral disturbance: Secondary | ICD-10-CM | POA: Diagnosis not present

## 2016-07-11 DIAGNOSIS — G629 Polyneuropathy, unspecified: Secondary | ICD-10-CM | POA: Diagnosis not present

## 2016-07-11 DIAGNOSIS — R2681 Unsteadiness on feet: Secondary | ICD-10-CM | POA: Diagnosis not present

## 2016-07-11 DIAGNOSIS — F0281 Dementia in other diseases classified elsewhere with behavioral disturbance: Secondary | ICD-10-CM | POA: Diagnosis not present

## 2016-07-11 DIAGNOSIS — N183 Chronic kidney disease, stage 3 (moderate): Secondary | ICD-10-CM | POA: Diagnosis not present

## 2016-07-11 DIAGNOSIS — R1310 Dysphagia, unspecified: Secondary | ICD-10-CM | POA: Diagnosis not present

## 2016-07-11 DIAGNOSIS — G309 Alzheimer's disease, unspecified: Secondary | ICD-10-CM | POA: Diagnosis not present

## 2016-07-17 DIAGNOSIS — F0281 Dementia in other diseases classified elsewhere with behavioral disturbance: Secondary | ICD-10-CM | POA: Diagnosis not present

## 2016-07-17 DIAGNOSIS — G309 Alzheimer's disease, unspecified: Secondary | ICD-10-CM | POA: Diagnosis not present

## 2016-07-17 DIAGNOSIS — N183 Chronic kidney disease, stage 3 (moderate): Secondary | ICD-10-CM | POA: Diagnosis not present

## 2016-07-17 DIAGNOSIS — R1310 Dysphagia, unspecified: Secondary | ICD-10-CM | POA: Diagnosis not present

## 2016-07-17 DIAGNOSIS — G629 Polyneuropathy, unspecified: Secondary | ICD-10-CM | POA: Diagnosis not present

## 2016-07-17 DIAGNOSIS — R2681 Unsteadiness on feet: Secondary | ICD-10-CM | POA: Diagnosis not present

## 2016-07-19 DIAGNOSIS — G629 Polyneuropathy, unspecified: Secondary | ICD-10-CM | POA: Diagnosis not present

## 2016-07-19 DIAGNOSIS — G309 Alzheimer's disease, unspecified: Secondary | ICD-10-CM | POA: Diagnosis not present

## 2016-07-19 DIAGNOSIS — R2681 Unsteadiness on feet: Secondary | ICD-10-CM | POA: Diagnosis not present

## 2016-07-19 DIAGNOSIS — F0281 Dementia in other diseases classified elsewhere with behavioral disturbance: Secondary | ICD-10-CM | POA: Diagnosis not present

## 2016-07-19 DIAGNOSIS — N183 Chronic kidney disease, stage 3 (moderate): Secondary | ICD-10-CM | POA: Diagnosis not present

## 2016-07-19 DIAGNOSIS — R1310 Dysphagia, unspecified: Secondary | ICD-10-CM | POA: Diagnosis not present

## 2016-07-22 DIAGNOSIS — G309 Alzheimer's disease, unspecified: Secondary | ICD-10-CM | POA: Diagnosis not present

## 2016-07-22 DIAGNOSIS — R2681 Unsteadiness on feet: Secondary | ICD-10-CM | POA: Diagnosis not present

## 2016-07-22 DIAGNOSIS — N183 Chronic kidney disease, stage 3 (moderate): Secondary | ICD-10-CM | POA: Diagnosis not present

## 2016-07-22 DIAGNOSIS — R1310 Dysphagia, unspecified: Secondary | ICD-10-CM | POA: Diagnosis not present

## 2016-07-22 DIAGNOSIS — G629 Polyneuropathy, unspecified: Secondary | ICD-10-CM | POA: Diagnosis not present

## 2016-07-22 DIAGNOSIS — F0281 Dementia in other diseases classified elsewhere with behavioral disturbance: Secondary | ICD-10-CM | POA: Diagnosis not present

## 2016-07-24 DIAGNOSIS — F0281 Dementia in other diseases classified elsewhere with behavioral disturbance: Secondary | ICD-10-CM | POA: Diagnosis not present

## 2016-07-24 DIAGNOSIS — G629 Polyneuropathy, unspecified: Secondary | ICD-10-CM | POA: Diagnosis not present

## 2016-07-24 DIAGNOSIS — N183 Chronic kidney disease, stage 3 (moderate): Secondary | ICD-10-CM | POA: Diagnosis not present

## 2016-07-24 DIAGNOSIS — R2681 Unsteadiness on feet: Secondary | ICD-10-CM | POA: Diagnosis not present

## 2016-07-24 DIAGNOSIS — G309 Alzheimer's disease, unspecified: Secondary | ICD-10-CM | POA: Diagnosis not present

## 2016-07-24 DIAGNOSIS — R1310 Dysphagia, unspecified: Secondary | ICD-10-CM | POA: Diagnosis not present

## 2016-07-25 DIAGNOSIS — G629 Polyneuropathy, unspecified: Secondary | ICD-10-CM | POA: Diagnosis not present

## 2016-07-25 DIAGNOSIS — R1310 Dysphagia, unspecified: Secondary | ICD-10-CM | POA: Diagnosis not present

## 2016-07-25 DIAGNOSIS — G309 Alzheimer's disease, unspecified: Secondary | ICD-10-CM | POA: Diagnosis not present

## 2016-07-25 DIAGNOSIS — R2681 Unsteadiness on feet: Secondary | ICD-10-CM | POA: Diagnosis not present

## 2016-07-25 DIAGNOSIS — N183 Chronic kidney disease, stage 3 (moderate): Secondary | ICD-10-CM | POA: Diagnosis not present

## 2016-07-25 DIAGNOSIS — F0281 Dementia in other diseases classified elsewhere with behavioral disturbance: Secondary | ICD-10-CM | POA: Diagnosis not present

## 2016-07-29 DIAGNOSIS — R1310 Dysphagia, unspecified: Secondary | ICD-10-CM | POA: Diagnosis not present

## 2016-07-29 DIAGNOSIS — G309 Alzheimer's disease, unspecified: Secondary | ICD-10-CM | POA: Diagnosis not present

## 2016-07-29 DIAGNOSIS — N183 Chronic kidney disease, stage 3 (moderate): Secondary | ICD-10-CM | POA: Diagnosis not present

## 2016-07-29 DIAGNOSIS — F0281 Dementia in other diseases classified elsewhere with behavioral disturbance: Secondary | ICD-10-CM | POA: Diagnosis not present

## 2016-07-29 DIAGNOSIS — G629 Polyneuropathy, unspecified: Secondary | ICD-10-CM | POA: Diagnosis not present

## 2016-07-29 DIAGNOSIS — R2681 Unsteadiness on feet: Secondary | ICD-10-CM | POA: Diagnosis not present

## 2016-07-31 ENCOUNTER — Other Ambulatory Visit: Payer: Self-pay | Admitting: Family Medicine

## 2016-08-02 DIAGNOSIS — R1310 Dysphagia, unspecified: Secondary | ICD-10-CM | POA: Diagnosis not present

## 2016-08-02 DIAGNOSIS — N183 Chronic kidney disease, stage 3 (moderate): Secondary | ICD-10-CM | POA: Diagnosis not present

## 2016-08-02 DIAGNOSIS — F0281 Dementia in other diseases classified elsewhere with behavioral disturbance: Secondary | ICD-10-CM | POA: Diagnosis not present

## 2016-08-02 DIAGNOSIS — R2681 Unsteadiness on feet: Secondary | ICD-10-CM | POA: Diagnosis not present

## 2016-08-02 DIAGNOSIS — G309 Alzheimer's disease, unspecified: Secondary | ICD-10-CM | POA: Diagnosis not present

## 2016-08-02 DIAGNOSIS — G629 Polyneuropathy, unspecified: Secondary | ICD-10-CM | POA: Diagnosis not present

## 2016-08-05 ENCOUNTER — Telehealth: Payer: Self-pay | Admitting: Family Medicine

## 2016-08-05 DIAGNOSIS — F0281 Dementia in other diseases classified elsewhere with behavioral disturbance: Secondary | ICD-10-CM | POA: Diagnosis not present

## 2016-08-05 DIAGNOSIS — G309 Alzheimer's disease, unspecified: Secondary | ICD-10-CM | POA: Diagnosis not present

## 2016-08-05 DIAGNOSIS — R1310 Dysphagia, unspecified: Secondary | ICD-10-CM | POA: Diagnosis not present

## 2016-08-05 DIAGNOSIS — G629 Polyneuropathy, unspecified: Secondary | ICD-10-CM | POA: Diagnosis not present

## 2016-08-05 DIAGNOSIS — N183 Chronic kidney disease, stage 3 (moderate): Secondary | ICD-10-CM | POA: Diagnosis not present

## 2016-08-05 DIAGNOSIS — R2681 Unsteadiness on feet: Secondary | ICD-10-CM | POA: Diagnosis not present

## 2016-08-05 NOTE — Telephone Encounter (Signed)
Patient's daughter called asking if Dr. Christell ConstantMoore will still be doing Home visit tomorrow 08/06/16

## 2016-08-05 NOTE — Telephone Encounter (Signed)
noted 

## 2016-08-05 NOTE — Telephone Encounter (Signed)
Spoke with daughter

## 2016-08-06 ENCOUNTER — Ambulatory Visit: Payer: Medicare Other | Admitting: Family Medicine

## 2016-08-06 ENCOUNTER — Encounter: Payer: Self-pay | Admitting: Family Medicine

## 2016-08-06 VITALS — BP 111/62 | HR 72 | Temp 97.4°F | Ht 72.0 in | Wt 141.0 lb

## 2016-08-06 DIAGNOSIS — R2681 Unsteadiness on feet: Secondary | ICD-10-CM | POA: Diagnosis not present

## 2016-08-06 DIAGNOSIS — E038 Other specified hypothyroidism: Secondary | ICD-10-CM

## 2016-08-06 DIAGNOSIS — R159 Full incontinence of feces: Secondary | ICD-10-CM | POA: Diagnosis not present

## 2016-08-06 DIAGNOSIS — R531 Weakness: Secondary | ICD-10-CM

## 2016-08-06 DIAGNOSIS — E559 Vitamin D deficiency, unspecified: Secondary | ICD-10-CM | POA: Diagnosis not present

## 2016-08-06 DIAGNOSIS — R3981 Functional urinary incontinence: Secondary | ICD-10-CM | POA: Diagnosis not present

## 2016-08-06 DIAGNOSIS — R413 Other amnesia: Secondary | ICD-10-CM

## 2016-08-06 DIAGNOSIS — N183 Chronic kidney disease, stage 3 unspecified: Secondary | ICD-10-CM

## 2016-08-06 DIAGNOSIS — S3210XS Unspecified fracture of sacrum, sequela: Secondary | ICD-10-CM | POA: Diagnosis not present

## 2016-08-06 DIAGNOSIS — E034 Atrophy of thyroid (acquired): Secondary | ICD-10-CM | POA: Diagnosis not present

## 2016-08-06 MED ORDER — OMEPRAZOLE 20 MG PO CPDR: 20.0000 mg | DELAYED_RELEASE_CAPSULE | Freq: Every day | ORAL | 3 refills | Status: DC

## 2016-08-06 MED ORDER — DONEPEZIL HCL 10 MG PO TABS: 10.0000 mg | ORAL_TABLET | Freq: Every day | ORAL | 3 refills | Status: AC

## 2016-08-06 MED ORDER — LEVOTHYROXINE SODIUM 75 MCG PO TABS: 75.0000 ug | ORAL_TABLET | Freq: Every day | ORAL | 3 refills | Status: AC

## 2016-08-06 MED ORDER — GABAPENTIN 100 MG PO CAPS: 100.0000 mg | ORAL_CAPSULE | Freq: Every day | ORAL | 3 refills | Status: DC

## 2016-08-06 NOTE — Patient Instructions (Addendum)
Medicare Annual Wellness Visit  [place] and the medical providers at [facility] strive to bring you the best medical care.  In doing so we not only want to address your current medical conditions and concerns but also to detect new conditions early and prevent illness, disease and health-related problems.    Medicare offers a yearly Wellness Visit which allows our clinical staff to assess your need for preventative services including immunizations, lifestyle education, counseling to decrease risk of preventable diseases and screening for fall risk and other medical concerns.    This visit is provided free of charge (no copay) for all Medicare recipients. The clinical pharmacists at [facility] have begun to conduct these Wellness Visits which will also include a thorough review of all your medications.    As you primary medical provider recommend that you make an appointment for your Annual Wellness Visit if you have not done so already this year.  You may set up this appointment before you leave today or you may call back [phone]) and schedule an appointment.  Please make sure when you call that you mention that you are scheduling your Annual Wellness Visit with the clinical pharmacist so that the appointment may be made for the proper length of time.     Continue current medications. Continue good therapeutic lifestyle changes which include good diet and exercise. Fall precautions discussed with patient. If an FOBT was given today- please return it to our front desk. If you are over 80 years old - you may need Prevnar 13 or the adult Pneumonia vaccine.  **Flu shots are available--- please call and schedule a FLU-CLINIC appointment**  After your visit with us today you will receive a survey in the mail or online from [employer] regarding your care with us. Please take a moment to fill this out. Your feedback is very  important to us as you can help us better understand your patient needs as well as improve your experience and satisfaction. WE CARE ABOUT YOU!!!     We will arrange home health to come back out and continue seeing the pt.  The patient did receive his annual flu shot today. We are pleased to see that he has been stable for several months and the family seems to be of provider care that he needs with the support of home health We will send someone to draw a thyroid panel BMP liver function test and CBC

## 2016-08-06 NOTE — Progress Notes (Signed)
Subjective:    Patient ID: Joseph [name], male    DOB: 06-22-1926, 82 y.o.   MRN: [id number]  HPI This patient was admitted to the hospital following a fall at home and was found to have a sacral fracture with hematoma. He subsequently was discharged and is now back at home being taken care of by his family including his wife and daughter. He is been getting home health care. He is unable to stand. He does wear a depends constantly. He is in a wheelchair during the day and is transferred back to the bed at night. He is able to use the bathroom once he is on the toilet at appropriate times during the day. He still has accidents but does have some control. The family indicates he has no complaints or problems and he is not agitated. His appetite is good and he eats 3 meals daily. He does not recognize any of his family members and did not recognize me when I made a home visit. Overall he appears to be in good physical shape just not able to communicate. The family seems to be supportive and working out a situation of taking care of him along with home health support with bathing. Overall the patient seems to be content and in no distress. Today is a face to face visit for home health to be continued.Most of the history was obtained from the daughter and wife.   Patient Active Problem List   Diagnosis Date Noted  . Fall   . Elevated troponin I level 02/12/2016  . Acute blood loss anemia 02/12/2016  . Syncope and collapse 02/08/2016  . Sacral fracture, closed (HCC) 02/08/2016  . Syncope 08/29/2015  . Elevated d-dimer 08/29/2015  . Closed left hip fracture (HCC)   . Hip fracture (HCC) 08/23/2015  . Intertrochanteric fracture of left hip (HCC) 08/23/2015  . Chronic kidney disease, stage III (moderate) 08/23/2015  . Lower extremity neuropathy 08/23/2015  . Leukocytosis 08/23/2015  . Arthropathy of cervical spine 08/23/2015  . Hematuria 08/23/2015  . Urinary incontinence due to cognitive impairment  06/08/2015  . Alzheimer's dementia without behavioral disturbance 02/01/2014  . BPH (benign prostatic hyperplasia) 02/01/2014  . Right hydrocele 02/01/2014  . Memory disturbance 04/14/2013  . Vitamin D deficiency 04/14/2013  . Elevated serum creatinine 04/14/2013  . Hyperlipemia 04/14/2013  . Hypothyroid 04/14/2013  . Fatigue 04/14/2013  . Leg weakness, bilateral 04/14/2013  . Other symptoms involving cardiovascular system 04/23/2012   Outpatient Encounter Prescriptions as of 08/06/2016  Medication Sig  . acetaminophen (TYLENOL) 160 MG/5ML solution Take 15.6 mLs (500 mg total) by mouth every 6 (six) hours as needed for mild pain.  . Cholecalciferol (VITAMIN D3) 2000 UNITS capsule Take 2,000 Units by mouth daily.  Marland Kitchen. donepezil (ARICEPT) 10 MG tablet Take 1 tablet (10 mg total) by mouth at bedtime. Do not start until march 2017  . feeding supplement, ENSURE ENLIVE, (ENSURE ENLIVE) LIQD Take 237 mLs by mouth 3 (three) times daily between meals.  . gabapentin (NEURONTIN) 100 MG capsule Take 1 capsule (100 mg total) by mouth at bedtime.  Marland Kitchen. HYDROcodone-acetaminophen (NORCO/VICODIN) 5-325 MG tablet Take 1 tablet by mouth every 6 (six) hours as needed for moderate pain.  Marland Kitchen. levothyroxine (SYNTHROID, LEVOTHROID) 75 MCG tablet TAKE 1 TABLET DAILY  . memantine (NAMENDA) 10 MG tablet TAKE  (1)  TABLET TWICE A DAY.  Marland Kitchen. omeprazole (PRILOSEC) 20 MG capsule Take 1 capsule (20 mg total) by mouth daily.  . potassium chloride (  K-DUR) 10 MEQ tablet Take 1 tablet (10 mEq total) by mouth daily.   No facility-administered encounter medications on file as of 08/06/2016.      Review of Systems  Constitutional: Negative.   HENT: Negative.   Eyes: Negative.   Respiratory: Negative.   Cardiovascular: Negative.   Gastrointestinal: Negative.   Endocrine: Negative.   Genitourinary: Negative.   Musculoskeletal: Negative.   Skin: Negative.   Allergic/Immunologic: Negative.   Neurological: Negative.     Hematological: Negative.   Psychiatric/Behavioral: Negative.        Objective:   Physical Exam  Constitutional: He is oriented to person, place, and time. He appears well-developed and well-nourished. No distress.  HENT:  Head: Normocephalic and atraumatic.  Nose: Nose normal.  Mouth/Throat: Oropharynx is clear and moist. No oropharyngeal exudate.  Eyes: Conjunctivae and EOM are normal. Pupils are equal, round, and reactive to light. Right eye exhibits no discharge. Left eye exhibits no discharge. No scleral icterus.  Neck: Normal range of motion. Neck supple. No thyromegaly present.  Cardiovascular: Normal rate and regular rhythm.   Murmur heard. The heart was regular at 74/m with a grade 3/6 systolic ejection murmur.  Pulmonary/Chest: Effort normal and breath sounds normal. No respiratory distress. He has no wheezes. He has no rales. He exhibits no tenderness.  Clear anteriorly and posteriorly  Abdominal: Soft. Bowel sounds are normal. He exhibits no mass. There is no tenderness. There is no rebound and no guarding.  Musculoskeletal: He exhibits no edema.  The patient is confined to wheelchair and needs assistance with transfer to the bed or to the commode.  Lymphadenopathy:    He has no cervical adenopathy.  Neurological: He is alert and oriented to person, place, and time.  Skin: Skin is warm and dry. No rash noted.  The skin appeared to be in good condition and intact without any  breakdown.  Psychiatric: He has a normal mood and affect. His behavior is normal. Judgment and thought content normal.  Nursing note and vitals reviewed.  BP 111/62 (BP Location: Left Arm)   Pulse 72   Temp 97.4 F (36.3 C) (Oral)   Ht 6' (1.829 m)   Wt 141 lb (64 kg)   BMI 19.12 kg/m       Assessment & Plan:  1. Vitamin D deficiency -And 10 you current treatment  2. Memory disturbance -Continue with his much stimulation as possible through visual experience and auditory experience -  Home Health - Face-to-face encounter (required for Medicare/Medicaid patients)  3. Urinary incontinence due to cognitive impairment -Continue with depends. The patient is having no symptomatology and no abdominal pain. - Home Health - Face-to-face encounter (required for Medicare/Medicaid patients)  4. Hypothyroidism due to acquired atrophy of thyroid -Continue with current treatment pending results of lab work  5. Chronic kidney disease, stage III (moderate) -Check BMP  6. Gait instability -The patient is unable to bear weight and unable to stand without assistance or transfer from 1 position to another position without assistance. - Home Health - Face-to-face encounter (required for Medicare/Medicaid patients)  7. Incontinence of bowel -The patient is incontinent but seems to be able to use the commode when seated on this at the same time every day. - Home Health - Face-to-face encounter (required for Medicare/Medicaid patients)  8. Weakness generalized -His generalized weakness appears to be stable with no worsening and no improvement - Home Health - Face-to-face encounter (required for Medicare/Medicaid patients)  9. Closed fracture of sacrum, unspecified  fracture morphology, sequela (HCC) -Today there appeared to be no consequences from the fracture. - Home Health - Face-to-face encounter (required for Medicare/Medicaid patients)  About 30 minutes of time was spent with the patient examining him and talking with the family  Patient Instructions                       Medicare Annual Wellness Visit  [place] and the medical providers at [facility] strive to bring you the best medical care.  In doing so we not only want to address your current medical conditions and concerns but also to detect new conditions early and prevent illness, disease and health-related problems.    Medicare offers a yearly Wellness Visit which allows our clinical staff to  assess your need for preventative services including immunizations, lifestyle education, counseling to decrease risk of preventable diseases and screening for fall risk and other medical concerns.    This visit is provided free of charge (no copay) for all Medicare recipients. The clinical pharmacists at [facility] have begun to conduct these Wellness Visits which will also include a thorough review of all your medications.    As you primary medical provider recommend that you make an appointment for your Annual Wellness Visit if you have not done so already this year.  You may set up this appointment before you leave today or you may call back [phone]) and schedule an appointment.  Please make sure when you call that you mention that you are scheduling your Annual Wellness Visit with the clinical pharmacist so that the appointment may be made for the proper length of time.     Continue current medications. Continue good therapeutic lifestyle changes which include good diet and exercise. Fall precautions discussed with patient. If an FOBT was given today- please return it to our front desk. If you are over 53 years old - you may need Prevnar 13 or the adult Pneumonia vaccine.  **Flu shots are available--- please call and schedule a FLU-CLINIC appointment**  After your visit with [place] today you will receive a survey in the mail or online from [employer] regarding your care with [place]. Please take a moment to fill this out. Your feedback is very important to [place] as you can help [place] better understand your patient needs as well as improve your experience and satisfaction. WE CARE ABOUT YOU!!!     We will arrange home health to come back out and continue seeing the pt.  The patient did receive his annual flu shot today. We are pleased to see that he has been stable for several months and the family seems to be of provider care that he needs with the support of home health We will  send someone to draw a thyroid panel BMP liver function test and CBC  A lift chair was recommended to assist the family and help the patient to be able to [place] get up from a sitting position in order to transfer to a different position.   Nyra Capes MD

## 2016-08-06 NOTE — Addendum Note (Signed)
Addended by: Magdalene RiverBULLINS, JAMIE H on: 08/06/2016 03:12 PM   Modules accepted: Orders

## 2016-08-07 ENCOUNTER — Telehealth: Payer: Self-pay | Admitting: Family Medicine

## 2016-08-07 NOTE — Telephone Encounter (Signed)
[facility] will no longer see the pt - we will turn it over to [facility] - [facility] Ke[facility]ory [facility] be sent a message

## 2016-08-08 ENCOUNTER — Telehealth: Payer: Self-pay | Admitting: *Deleted

## 2016-08-08 ENCOUNTER — Other Ambulatory Visit: Payer: Medicare Other

## 2016-08-08 DIAGNOSIS — R531 Weakness: Secondary | ICD-10-CM | POA: Diagnosis not present

## 2016-08-08 DIAGNOSIS — R413 Other amnesia: Secondary | ICD-10-CM | POA: Diagnosis not present

## 2016-08-08 DIAGNOSIS — E034 Atrophy of thyroid (acquired): Secondary | ICD-10-CM

## 2016-08-08 DIAGNOSIS — N183 Chronic kidney disease, stage 3 unspecified: Secondary | ICD-10-CM

## 2016-08-08 DIAGNOSIS — E038 Other specified hypothyroidism: Secondary | ICD-10-CM | POA: Diagnosis not present

## 2016-08-08 DIAGNOSIS — E559 Vitamin D deficiency, unspecified: Secondary | ICD-10-CM

## 2016-08-08 NOTE — Telephone Encounter (Signed)
Per Anderson Malta at [facility] patient is not eligible for there services. He has met the maximum potential for both of there services Nursing/physical Therapy.

## 2016-08-08 NOTE — Telephone Encounter (Signed)
Patient's family aware.

## 2016-08-08 NOTE — Telephone Encounter (Signed)
Please let family know this. Also make sure that April goes and draws lab work and the order has been put in for that.

## 2016-08-09 DIAGNOSIS — R2689 Other abnormalities of gait and mobility: Secondary | ICD-10-CM | POA: Diagnosis not present

## 2016-08-09 DIAGNOSIS — R531 Weakness: Secondary | ICD-10-CM | POA: Diagnosis not present

## 2016-08-09 LAB — BMP8+EGFR
BUN/Creatinine Ratio: 15 (ref 10–24)
BUN: 18 mg/dL (ref 8–27)
CHLORIDE: 100 mmol/L (ref 96–106)
CO2: 27 mmol/L (ref 18–29)
Calcium: 9.1 mg/dL (ref 8.6–10.2)
Creatinine, Ser: 1.22 mg/dL (ref 0.76–1.27)
GFR calc Af Amer: 61 mL/min/{1.73_m2} (ref >59)
GFR calc non Af Amer: 53 mL/min/{1.73_m2} — ABNORMAL LOW (ref >59)
GLUCOSE: 116 mg/dL — AB (ref 65–99)
POTASSIUM: 4.3 mmol/L (ref 3.5–5.2)
Sodium: 141 mmol/L (ref 134–144)

## 2016-08-09 LAB — HEPATIC FUNCTION PANEL
ALBUMIN: 3.6 g/dL (ref 3.5–4.7)
ALT: 8 IU/L (ref 0–44)
AST: 13 IU/L (ref 0–40)
Alkaline Phosphatase: 116 IU/L (ref 39–117)
BILIRUBIN, DIRECT: 0.13 mg/dL (ref 0.00–0.40)
Bilirubin Total: 0.4 mg/dL (ref 0.0–1.2)
TOTAL PROTEIN: 6.7 g/dL (ref 6.0–8.5)

## 2016-08-09 LAB — CBC WITH DIFFERENTIAL/PLATELET
BASOS ABS: 0.1 10*3/uL (ref 0.0–0.2)
Basos: 1 %
EOS (ABSOLUTE): 0.2 10*3/uL (ref 0.0–0.4)
Eos: 3 %
HEMOGLOBIN: 13.9 g/dL (ref 12.6–17.7)
Hematocrit: 43 % (ref 37.5–51.0)
Immature Grans (Abs): 0 10*3/uL (ref 0.0–0.1)
Immature Granulocytes: 0 %
LYMPHS: 20 %
Lymphocytes Absolute: 1.7 10*3/uL (ref 0.7–3.1)
MCH: 31.8 pg (ref 26.6–33.0)
MCHC: 32.3 g/dL (ref 31.5–35.7)
MCV: 98 fL — ABNORMAL HIGH (ref 79–97)
MONOCYTES: 13 %
Monocytes Absolute: 1.1 10*3/uL — ABNORMAL HIGH (ref 0.1–0.9)
NEUTROS ABS: 5.4 10*3/uL (ref 1.4–7.0)
NEUTROS PCT: 63 %
PLATELETS: 231 10*3/uL (ref 150–379)
RBC: 4.37 x10E6/uL (ref 4.14–5.80)
RDW: 13.1 % (ref 12.3–15.4)
WBC: 8.5 10*3/uL (ref 3.4–10.8)

## 2016-08-09 LAB — THYROID PANEL WITH TSH
FREE THYROXINE INDEX: 2.2 (ref 1.2–4.9)
T3 Uptake Ratio: 28 % (ref 24–39)
T4 TOTAL: 7.7 ug/dL (ref 4.5–12.0)
TSH: 1.46 u[IU]/mL (ref 0.450–4.500)

## 2016-08-12 DIAGNOSIS — R2689 Other abnormalities of gait and mobility: Secondary | ICD-10-CM | POA: Diagnosis not present

## 2016-08-12 DIAGNOSIS — R531 Weakness: Secondary | ICD-10-CM | POA: Diagnosis not present

## 2016-08-13 ENCOUNTER — Telehealth: Payer: Self-pay | Admitting: *Deleted

## 2016-08-13 DIAGNOSIS — R29898 Other symptoms and signs involving the musculoskeletal system: Secondary | ICD-10-CM

## 2016-08-13 DIAGNOSIS — F0280 Dementia in other diseases classified elsewhere without behavioral disturbance: Secondary | ICD-10-CM

## 2016-08-13 DIAGNOSIS — Z8781 Personal history of (healed) traumatic fracture: Secondary | ICD-10-CM

## 2016-08-13 DIAGNOSIS — R2689 Other abnormalities of gait and mobility: Secondary | ICD-10-CM | POA: Diagnosis not present

## 2016-08-13 DIAGNOSIS — G309 Alzheimer's disease, unspecified: Secondary | ICD-10-CM

## 2016-08-13 DIAGNOSIS — R55 Syncope and collapse: Secondary | ICD-10-CM

## 2016-08-13 DIAGNOSIS — R531 Weakness: Secondary | ICD-10-CM | POA: Diagnosis not present

## 2016-08-13 DIAGNOSIS — G5793 Unspecified mononeuropathy of bilateral lower limbs: Secondary | ICD-10-CM

## 2016-08-13 NOTE — Telephone Encounter (Signed)
[name](Don) calls to report that Joseph [name] had a fall this morning around 3 am - he was found on the floor - no injuries.  OT recommended a video monitor, and a wired alarm put in the wifes bedroom - because she cant hear his alarm.   His Plan of care is that he will come in once a week for 2-3 weeks and work on things around the house. Again this is just OT order --- not PT or nursing.  This is FYI.

## 2016-08-13 NOTE — Telephone Encounter (Signed)
Daughter called to remind us to print order for reclining lift chair

## 2016-08-16 DIAGNOSIS — R531 Weakness: Secondary | ICD-10-CM | POA: Diagnosis not present

## 2016-08-16 DIAGNOSIS — R2689 Other abnormalities of gait and mobility: Secondary | ICD-10-CM | POA: Diagnosis not present

## 2016-08-19 DIAGNOSIS — R531 Weakness: Secondary | ICD-10-CM | POA: Diagnosis not present

## 2016-08-19 DIAGNOSIS — R2689 Other abnormalities of gait and mobility: Secondary | ICD-10-CM | POA: Diagnosis not present

## 2016-08-20 DIAGNOSIS — R531 Weakness: Secondary | ICD-10-CM | POA: Diagnosis not present

## 2016-08-20 DIAGNOSIS — R2689 Other abnormalities of gait and mobility: Secondary | ICD-10-CM | POA: Diagnosis not present

## 2016-08-21 DIAGNOSIS — R2689 Other abnormalities of gait and mobility: Secondary | ICD-10-CM | POA: Diagnosis not present

## 2016-08-21 DIAGNOSIS — R531 Weakness: Secondary | ICD-10-CM | POA: Diagnosis not present

## 2016-08-26 DIAGNOSIS — R2689 Other abnormalities of gait and mobility: Secondary | ICD-10-CM | POA: Diagnosis not present

## 2016-08-26 DIAGNOSIS — R531 Weakness: Secondary | ICD-10-CM | POA: Diagnosis not present

## 2016-08-27 DIAGNOSIS — R531 Weakness: Secondary | ICD-10-CM | POA: Diagnosis not present

## 2016-08-27 DIAGNOSIS — R2689 Other abnormalities of gait and mobility: Secondary | ICD-10-CM | POA: Diagnosis not present

## 2016-09-10 ENCOUNTER — Ambulatory Visit (INDEPENDENT_AMBULATORY_CARE_PROVIDER_SITE_OTHER): Payer: Medicare Other | Admitting: Family Medicine

## 2016-09-10 DIAGNOSIS — N183 Chronic kidney disease, stage 3 (moderate): Secondary | ICD-10-CM | POA: Diagnosis not present

## 2016-09-10 DIAGNOSIS — Z9181 History of falling: Secondary | ICD-10-CM | POA: Diagnosis not present

## 2016-09-10 DIAGNOSIS — F0280 Dementia in other diseases classified elsewhere without behavioral disturbance: Secondary | ICD-10-CM

## 2016-09-10 DIAGNOSIS — S3210XS Unspecified fracture of sacrum, sequela: Secondary | ICD-10-CM | POA: Diagnosis not present

## 2016-09-10 DIAGNOSIS — G308 Other Alzheimer's disease: Secondary | ICD-10-CM

## 2016-09-10 DIAGNOSIS — R531 Weakness: Secondary | ICD-10-CM

## 2016-09-10 DIAGNOSIS — R2689 Other abnormalities of gait and mobility: Secondary | ICD-10-CM | POA: Diagnosis not present

## 2016-10-02 ENCOUNTER — Telehealth: Payer: Self-pay | Admitting: Family Medicine

## 2016-10-02 MED ORDER — GABAPENTIN 100 MG PO CAPS: 100.0000 mg | ORAL_CAPSULE | Freq: Two times a day (BID) | ORAL | 3 refills | Status: AC

## 2016-10-02 NOTE — Telephone Encounter (Signed)
Fixed and daughter aware

## 2016-10-31 ENCOUNTER — Other Ambulatory Visit: Payer: Self-pay | Admitting: Family Medicine

## 2017-01-07 DIAGNOSIS — M79676 Pain in unspecified toe(s): Secondary | ICD-10-CM | POA: Diagnosis not present

## 2017-01-07 DIAGNOSIS — B351 Tinea unguium: Secondary | ICD-10-CM | POA: Diagnosis not present

## 2017-01-27 ENCOUNTER — Other Ambulatory Visit: Payer: Self-pay | Admitting: Family Medicine

## 2017-04-17 IMAGING — DX DG HIP (WITH OR WITHOUT PELVIS) 2-3V*L*
3 series · 3 of 3 positions shown · non-contrast
Comparison: None.

CLINICAL DATA: Patient fell on the left side.  Left hip pain.

EXAM:
DG HIP (WITH OR WITHOUT PELVIS) 2-3V LEFT

[pelvis ap]
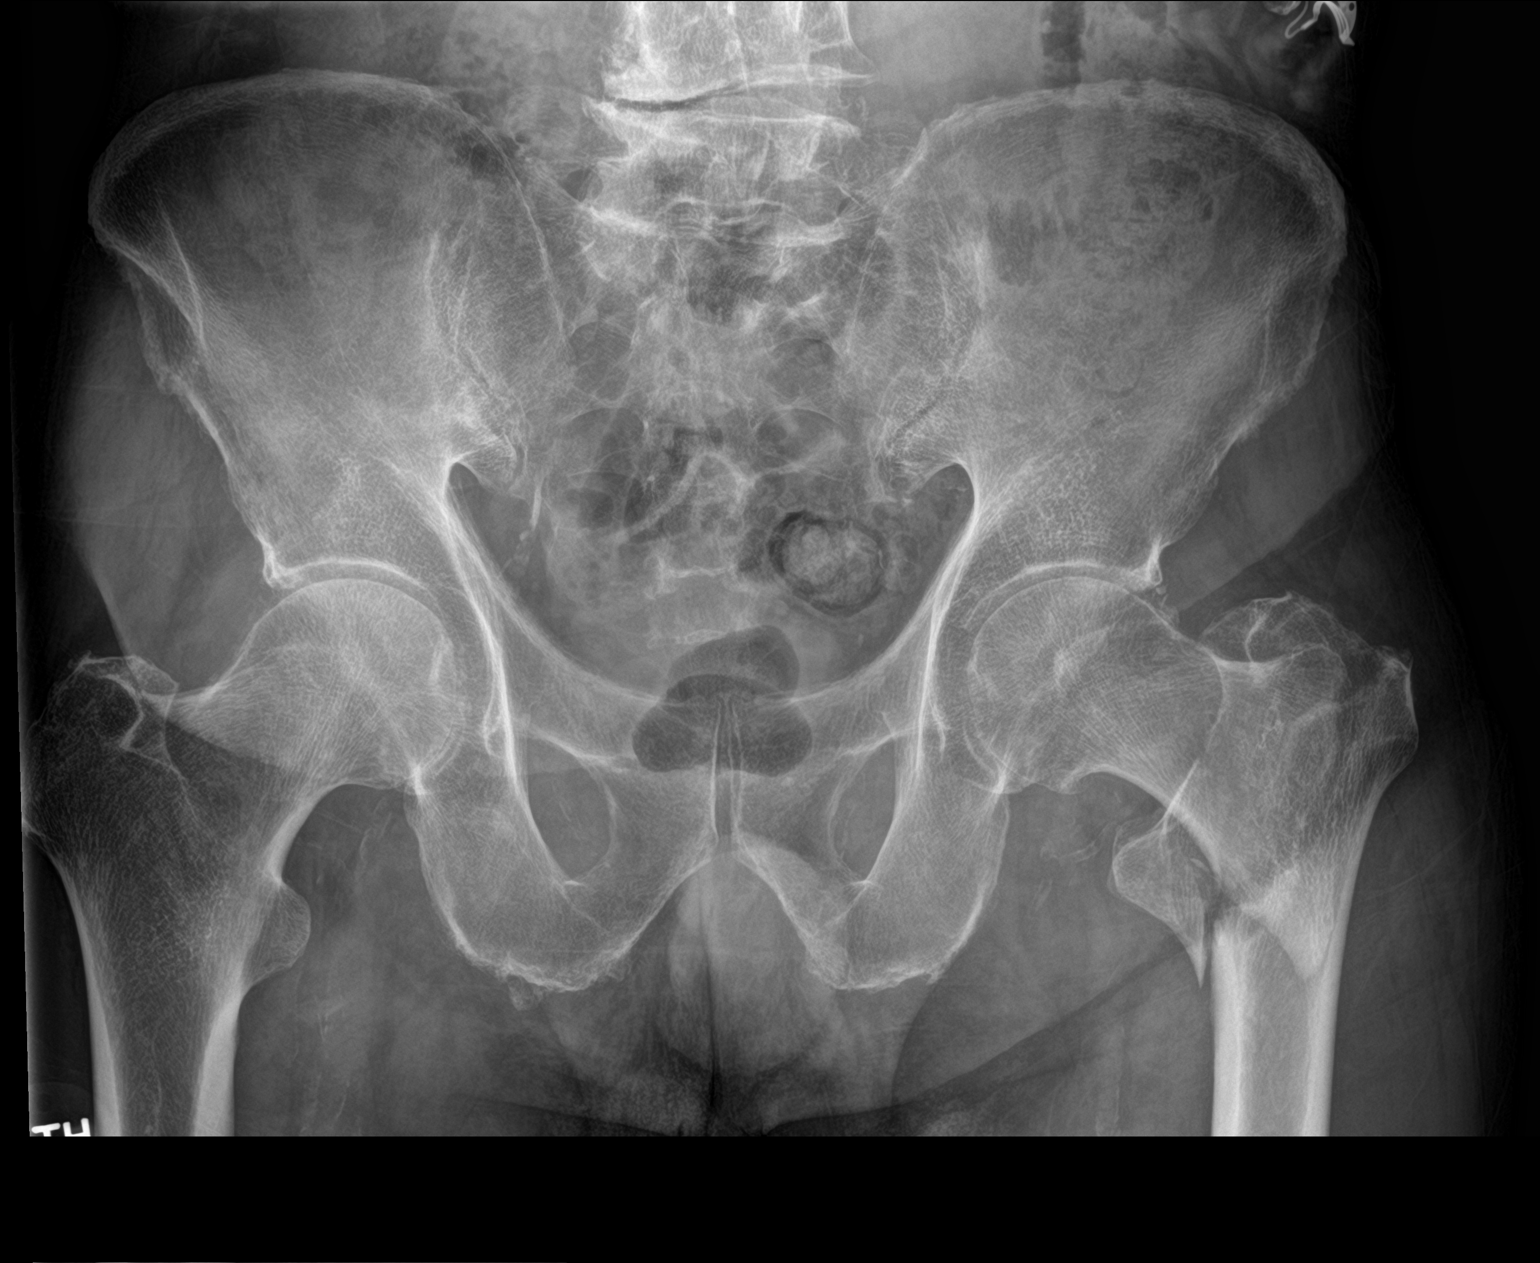

[hip ap]
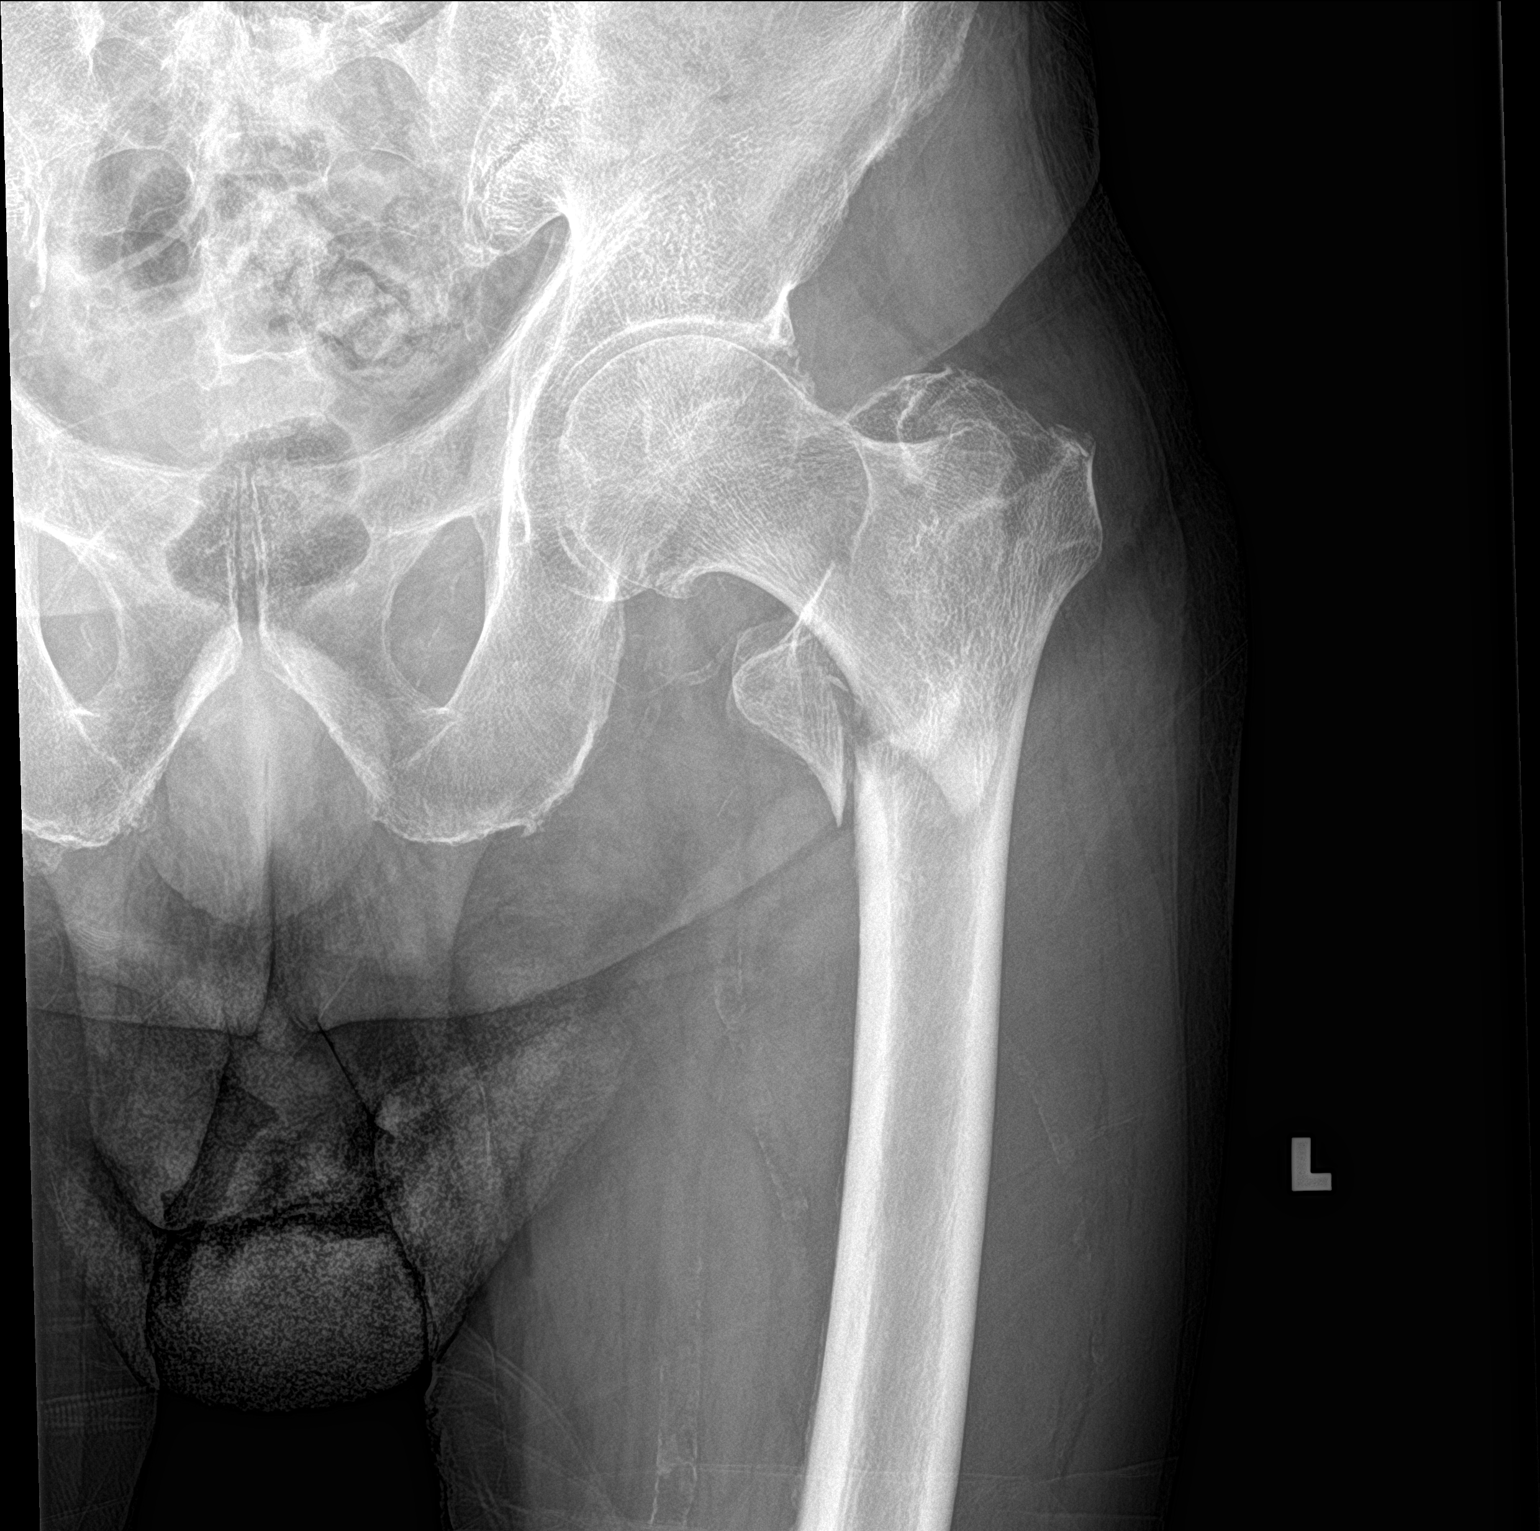

[hip lat]
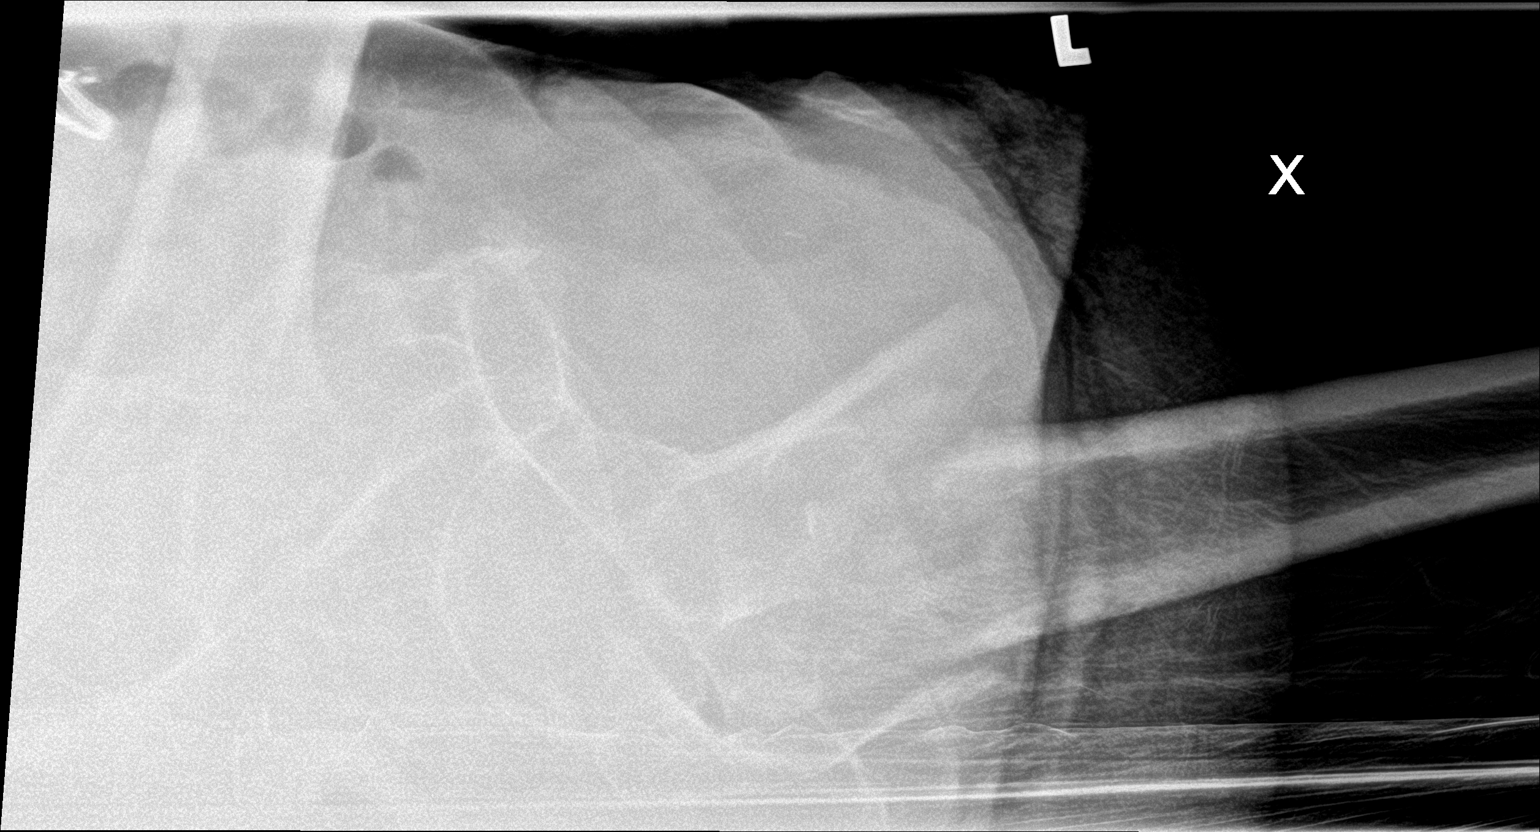

[3 of 3 positions shown; findings below may reference images not displayed]

FINDINGS: Acute comminuted posttraumatic inter trochanteric fractures of the
left hip with mild varus angulation and impaction of the fracture
fragments. Mild displacement of greater and lesser trochanteric
fragments. Degenerative changes in the left hip. No evidence of left
hip dislocation. No focal bone lesion or bone destruction. Vascular
calcifications. Pelvis appears intact. SI joints and symphysis pubis
are not displaced.
IMPRESSION: Acute comminuted inter trochanteric fractures in the left hip.

## 2017-04-17 IMAGING — MR MR CERVICAL SPINE W/O CM
4 of 5 series · 18 of 48 positions shown · non-contrast
Comparison: CT scan, same date.

CLINICAL DATA: Fell on 08/23/2015 and injured neck. Persistent
pain.

EXAM:
MRI CERVICAL SPINE WITHOUT CONTRAST
TECHNIQUE: Multiplanar, multisequence MR imaging of the cervical spine was
performed. No intravenous contrast was administered.

[Series 3: T2 · sagittal · 3.0mm · 0.27mm/px · 8 of 15 slices shown (1 of 2)]
[im 1/15]
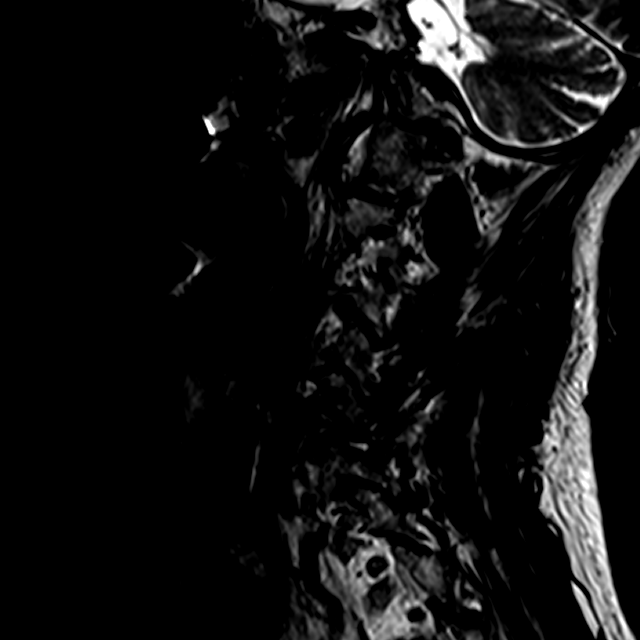
[im 3/15]
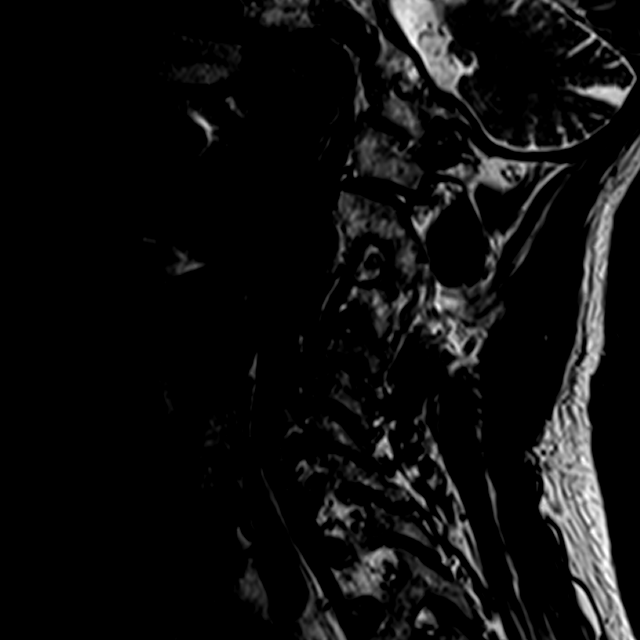
[im 5/15]
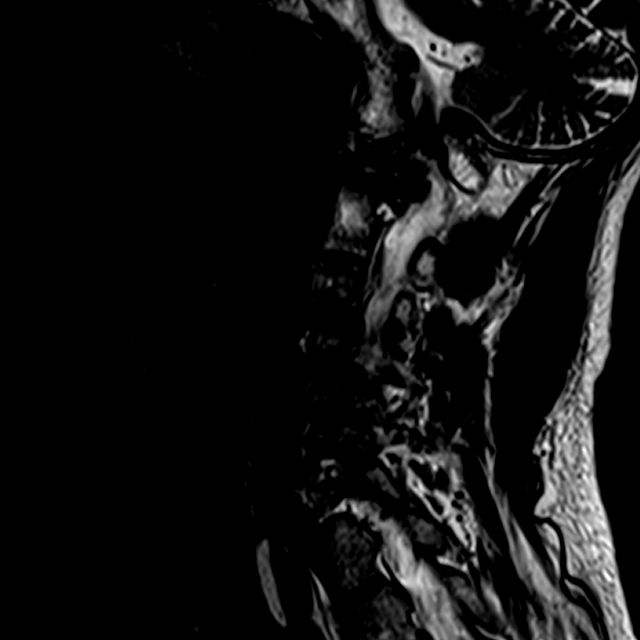
[im 7/15]
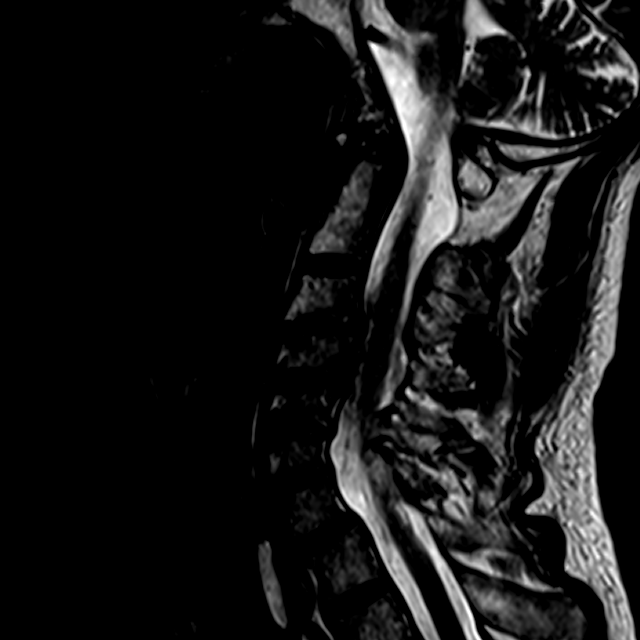
[im 9/15]
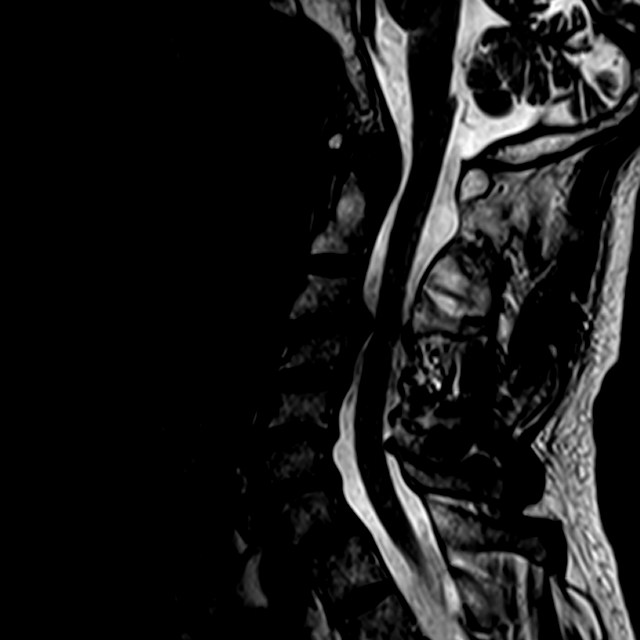
[im 11/15]
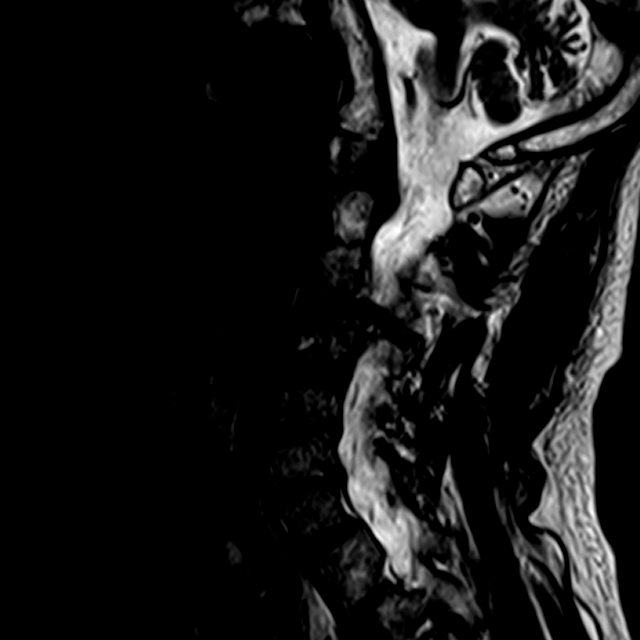
[im 13/15]
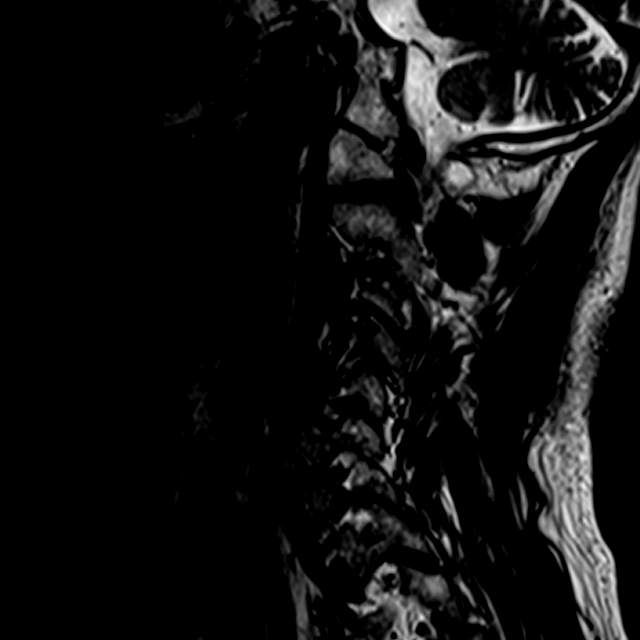
[im 15/15]
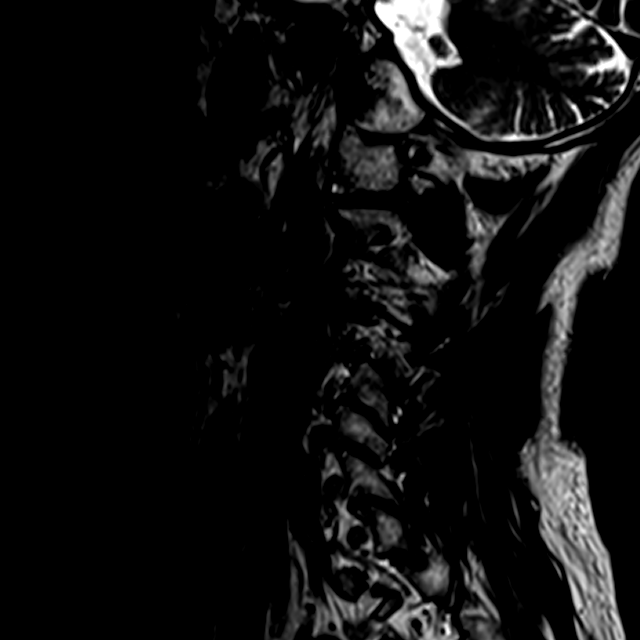

[Series 4: (id) sagital repeat · sagittal · 3.0mm · 0.61mm/px · 3 of 15 slices shown]
[im 3/15]
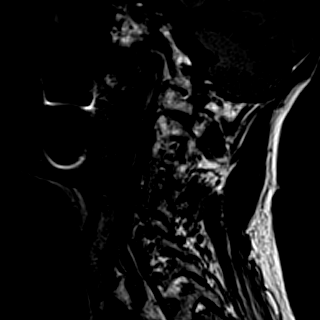
[im 8/15]
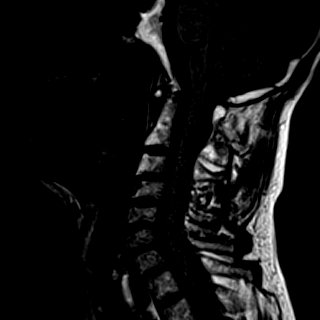
[im 12/15]
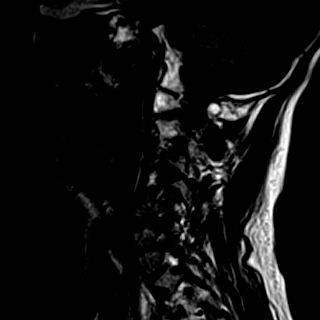

[Series 5: ir sagital repeat · sagittal · 3.0mm · 0.28mm/px · 3 of 15 slices shown]
[im 3/15]
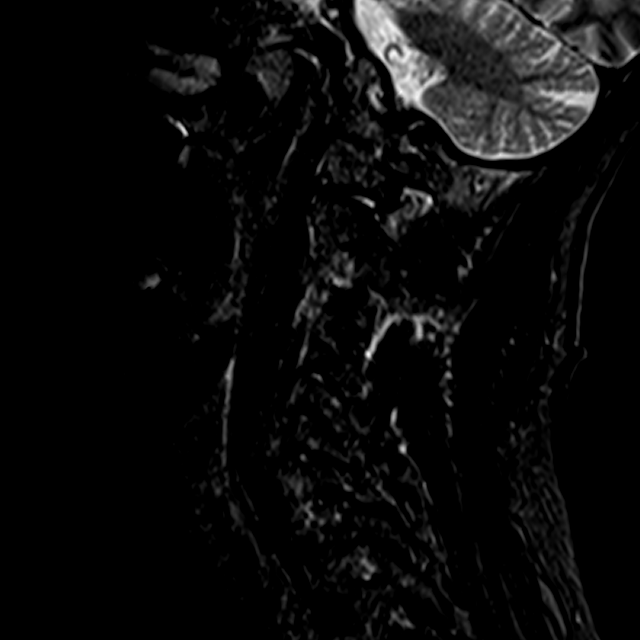
[im 8/15]
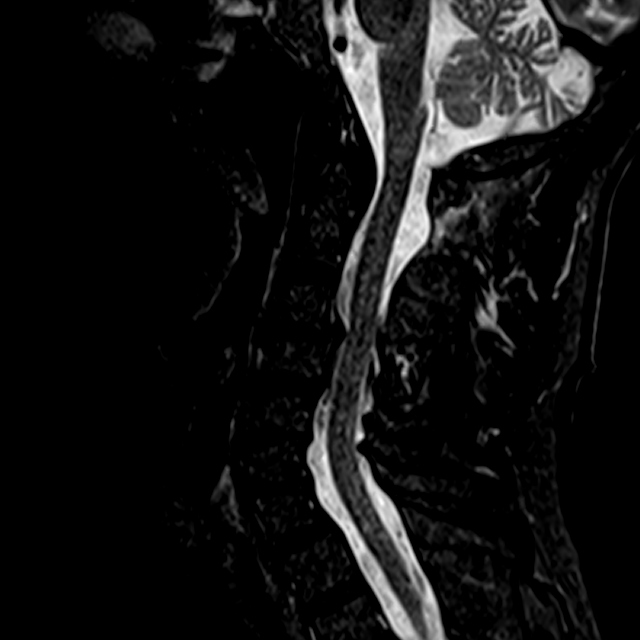
[im 12/15]
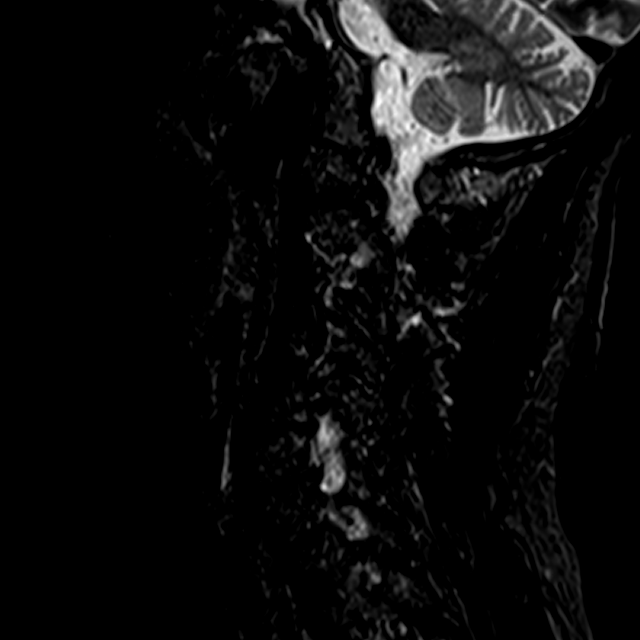

[Series 6: T2 · axial · 3.0mm · 0.50mm/px · z∈[-116,-46]mm · 4 of 28 slices shown (2 of 2)]
[im 1/28]
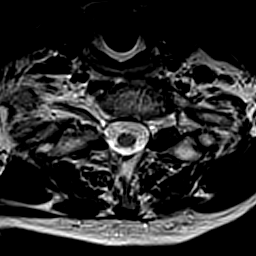
[im 5/28]
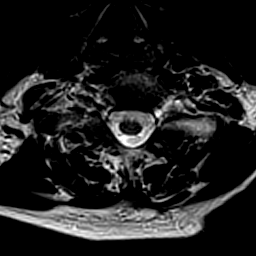
[im 14/28]
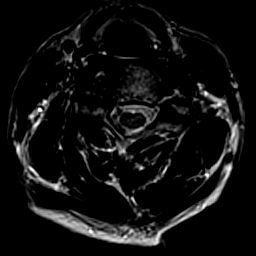
[im 23/28]
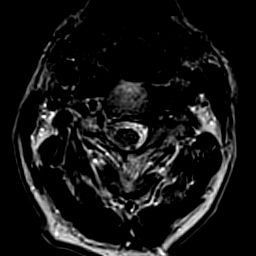

[18 of 48 positions shown; findings below may reference images not displayed]

FINDINGS: Advanced degenerative cervical spondylosis with multilevel disc
disease and facet disease. Moderate C1-2 degenerative changes with
pannus formation and mild narrowing of the ventral CSF space in the
upper cervical cord region. The vertebral bodies demonstrate normal
marrow signal. No findings to suggest an acute fracture. No abnormal
increased STIR signal intensity in the posterior elements,
paraspinal muscles or facets. The cervical spinal cord demonstrates
normal signal intensity. No cord contusion or syrinx.

C2-3:  No significant findings.

C3-4: Degenerative anterior subluxation of C3 due to advanced facet
disease. There is a bulging uncovered disc, osteophytic ridging and
uncinate spurring contributing to flattening of the ventral thecal
sac and narrowing of the ventral CSF space. There is moderate
foraminal stenosis bilaterally, left slightly worse than right.

C4-5: Bulging annulus, osteophytic ridging, uncinate spurring and
asymmetric right-sided facet disease. There is flattening of the
ventral thecal sac and narrowing of the ventral CSF space. There is
mild left foraminal stenosis and moderate to moderately severe right
foraminal stenosis.

C5-6: Right-sided disc osteophyte complex and right-sided facet
disease contributing to moderate to moderately severe right
foraminal stenosis. No spinal stenosis. Mild left foraminal
encroachment.

C6-7: Degenerative disc disease and right-sided facet disease. No
significant spinal stenosis. There is mild bilateral foraminal
stenosis.

C7-T1:  No significant findings.
IMPRESSION: 1. No MR findings to suggest an acute cervical spine fracture or
cord contusion. No abnormal prevertebral soft tissue swelling.
2. Advanced degenerative cervical spondylosis with multilevel disc
disease and facet disease as demonstrated on the CT scan.
3. Mild spinal and moderate bilateral foraminal stenosis at C3-4.
4. Mild spinal and mild left foraminal stenosis at C4-5. There is
also moderately severe right foraminal stenosis.
5. Right-sided disc osteophyte complex at C5-6 with moderate to
moderately severe right foraminal stenosis.
6. Mild bilateral foraminal stenosis at C6-7.

## 2017-04-28 ENCOUNTER — Other Ambulatory Visit: Payer: Self-pay | Admitting: Family Medicine

## 2017-04-30 ENCOUNTER — Ambulatory Visit: Payer: Medicare Other | Admitting: Family Medicine

## 2017-04-30 ENCOUNTER — Encounter: Payer: Self-pay | Admitting: Family Medicine

## 2017-04-30 VITALS — BP 110/58 | HR 62 | Temp 97.0°F

## 2017-04-30 DIAGNOSIS — R2681 Unsteadiness on feet: Secondary | ICD-10-CM | POA: Diagnosis not present

## 2017-04-30 DIAGNOSIS — Z7409 Other reduced mobility: Secondary | ICD-10-CM

## 2017-04-30 DIAGNOSIS — E034 Atrophy of thyroid (acquired): Secondary | ICD-10-CM | POA: Diagnosis not present

## 2017-04-30 DIAGNOSIS — R32 Unspecified urinary incontinence: Secondary | ICD-10-CM

## 2017-04-30 DIAGNOSIS — N183 Chronic kidney disease, stage 3 unspecified: Secondary | ICD-10-CM

## 2017-04-30 DIAGNOSIS — Z7401 Bed confinement status: Secondary | ICD-10-CM | POA: Diagnosis not present

## 2017-04-30 DIAGNOSIS — E559 Vitamin D deficiency, unspecified: Secondary | ICD-10-CM | POA: Diagnosis not present

## 2017-04-30 DIAGNOSIS — F0280 Dementia in other diseases classified elsewhere without behavioral disturbance: Secondary | ICD-10-CM | POA: Diagnosis not present

## 2017-04-30 DIAGNOSIS — G4710 Hypersomnia, unspecified: Secondary | ICD-10-CM

## 2017-04-30 DIAGNOSIS — R4181 Age-related cognitive decline: Secondary | ICD-10-CM

## 2017-04-30 DIAGNOSIS — G309 Alzheimer's disease, unspecified: Secondary | ICD-10-CM | POA: Diagnosis not present

## 2017-04-30 DIAGNOSIS — R159 Full incontinence of feces: Secondary | ICD-10-CM

## 2017-04-30 NOTE — Progress Notes (Addendum)
Subjective:    Patient ID: Joseph [name], male    DOB: [birth year], 83 y.o.   MRN: [id number]  HPI Patient was seen today in his home for chronic medical follow up. He is accompanied today by his wife and daughter, whom also are his caregivers. He is taking his medications by mouth from his daughter who is crushing them in applesauce. He is sitting in a wheelchair at the kitchen table asleep when we arrive. He does not know his wife or daughter by name nor does he know [place] today.He has definitely declined since his last visit. He is no longer to assist his family with the activities of daily living. He is unable to transfer himself from the bed to the toilet unless he has helped. He has become dead weight to the caregivers. He is becoming incontinent of bowels over the past 2-3 weeks and his appetite has decreased with him taking much longer to eat and requiring him to be fed. He is sleeping more and staying awake less. He has trouble with swallowing and has had some increased cough and congestion. When in the bed they do keep his head elevated somewhat but will try to elevate it more with this coughing and congestion. We did review all his medicines and contemplated stopping Namenda and Aricept. We will not do this at this time. We will stop the gabapentin. He will finish his current supply and purchased no more. The family has applied for assistance from the [place] but understand this may not be received 4 more months to go. After evaluating the patient today in doing the physical exam a feel that he will be a candidate for hospice. The family understands and agrees with this. We will try to get home health and to draw some blood work to assess his current renal function and thyroid function.     Patient Active Problem List   Diagnosis Date Noted  . Fall   . Elevated troponin I level 02/12/2016  . Acute blood loss anemia 02/12/2016  . Syncope and collapse 02/08/2016  . Sacral fracture, closed ([place])  02/08/2016  . Syncope 08/29/2015  . Elevated d-dimer 08/29/2015  . Closed left hip fracture ([place])   . Hip fracture ([place]) 08/23/2015  . Intertrochanteric fracture of left hip ([place]) 08/23/2015  . Chronic kidney disease, stage III (moderate) 08/23/2015  . Lower extremity neuropathy 08/23/2015  . Leukocytosis 08/23/2015  . Arthropathy of cervical spine ([place]) 08/23/2015  . Hematuria 08/23/2015  . Urinary incontinence due to cognitive impairment 06/08/2015  . Alzheimer's dementia without behavioral disturbance 02/01/2014  . BPH (benign prostatic hyperplasia) 02/01/2014  . Right hydrocele 02/01/2014  . Memory disturbance 04/14/2013  . Vitamin D deficiency 04/14/2013  . Elevated serum creatinine 04/14/2013  . Hyperlipemia 04/14/2013  . Hypothyroid 04/14/2013  . Fatigue 04/14/2013  . Leg weakness, bilateral 04/14/2013  . Other symptoms involving cardiovascular system 04/23/2012   Outpatient Encounter Prescriptions as of 04/30/2017  Medication Sig  . Cholecalciferol (VITAMIN D3) 2000 UNITS capsule Take 2,000 Units by mouth daily.  Marland Kitchen donepezil (ARICEPT) 10 MG tablet Take 1 tablet (10 mg total) by mouth at bedtime. Do not start until march 2017  . gabapentin (NEURONTIN) 100 MG capsule Take 1 capsule (100 mg total) by mouth 2 (two) times daily.  Marland Kitchen levothyroxine (SYNTHROID, LEVOTHROID) 75 MCG tablet Take 1 tablet (75 mcg total) by mouth daily.  . memantine (NAMENDA) 10 MG tablet TAKE (1) TABLET TWICE A DAY.  Marland Kitchen omeprazole (PRILOSEC)  20 MG capsule Take 1 capsule (20 mg total) by mouth daily.  . [DISCONTINUED] acetaminophen (TYLENOL) 160 MG/5ML solution Take 15.6 mLs (500 mg total) by mouth every 6 (six) hours as needed for mild pain.  . [DISCONTINUED] feeding supplement, ENSURE ENLIVE, (ENSURE ENLIVE) LIQD Take 237 mLs by mouth 3 (three) times daily between meals.  . [DISCONTINUED] HYDROcodone-acetaminophen (NORCO/VICODIN) 5-325 MG tablet Take 1 tablet by mouth every 6 (six) hours as needed for  moderate pain.  . [DISCONTINUED] potassium chloride (K-DUR) 10 MEQ tablet Take 1 tablet (10 mEq total) by mouth daily.   No facility-administered encounter medications on file as of 04/30/2017.       Review of Systems  Constitutional: Positive for activity change (decreased) and appetite change (decreased // unable to feed himself  ).  HENT: Negative.   Eyes: Negative.   Respiratory: Positive for cough (at times / uses necter thickener, chokes at times).   Cardiovascular: Negative.   Gastrointestinal: Negative.        Incontinence bowel   Endocrine: Negative.   Genitourinary: Negative.        Incontinence bladder  Musculoskeletal: Negative.   Skin: Negative.   Allergic/Immunologic: Negative.   Neurological:       Memory loss  Hematological: Negative.   Psychiatric/Behavioral: Positive for confusion (alzheimers ).       Objective:   Physical Exam  Constitutional: No distress.  The patient when first seen was strapped in a wheelchair and had no response to our examining him. He did not know who we were. His wife says he does not know who she is.  HENT:  Head: Normocephalic and atraumatic.  Nose: Nose normal.  Mouth/Throat: Oropharynx is clear and moist. No oropharyngeal exudate.  Eyes: Conjunctivae and EOM are normal. Pupils are equal, round, and reactive to light. Right eye exhibits no discharge. Left eye exhibits no discharge. No scleral icterus.  Neck: Normal range of motion. Neck supple. No thyromegaly present.  Cardiovascular: Normal rate, regular rhythm and normal heart sounds.   No murmur heard. The heart had a regular rate and rhythm at 72/m  Pulmonary/Chest: Effort normal and breath sounds normal. No respiratory distress. He has no wheezes. He has no rales.  The patient did respond when I ask him to take deep breaths and the lungs were clear anteriorly and posteriorly.  Abdominal: Soft. Bowel sounds are normal. He exhibits no distension and no mass. There is no  tenderness. There is no rebound and no guarding.  There was no abdominal tenderness or distention. There were no masses or organ enlargement detected.  Musculoskeletal: He exhibits no edema, tenderness or deformity.  The patient once again was in a wheelchair and is unable to walk or transfer himself from 1 position to another.  Lymphadenopathy:    He has no cervical adenopathy.  Neurological:  The patient is definitely not alert to time and place or position. His Alzheimer's dementia has worsened.  Skin: Skin is warm and dry. No rash noted. No erythema.  The skin appeared to be in good shape with no obvious breakdown and no rash.  Psychiatric: He has a normal mood and affect. His behavior is normal.  Patient was not agitated and appeared to be calm.  Nursing note and vitals reviewed.  BP (!) 110/58 (BP Location: Left Arm)   Pulse 62   Temp 97 F (36.1 C) (Oral)   SpO2 (!) 88% Comment: room air  The patient's wife and daughter were present along with  my nurse during the visit. We spent a good deal of time discussing his current symptoms and his recent decline along with the need for more help for the patient's wife at home. We will contact home health. We discussed getting some basic lab work on the patient including a CBC BMP and liver function test and thyroid. This should be done to assess his baseline stability at this point in time. With the decline that we have seen and I've heard about from the family feel like he might be a good candidate for hospice. We will wait until the lab work is returned until we make that decision. The family will continue to work on any benefits that they can get from the [place]. About 40 minutes of face-to-face time with the patient and his family discussing all of this information and doing the physical exam was done.      Assessment & Plan:  1. Alzheimer's dementia without behavioral disturbance, unspecified timing of dementia onset -The patient's dementia  has worsened. - Home Health - Face-to-face encounter (required for Medicare/Medicaid patients) - CBC with Differential/Platelet; Future  2. Vitamin D deficiency - CBC with Differential/Platelet; Future  3. Hypothyroidism due to acquired atrophy of thyroid - CBC with Differential/Platelet; Future - Thyroid Panel With TSH; Future  4. Chronic kidney disease, stage III (moderate) - CBC with Differential/Platelet; Future - BMP8+EGFR; Future  5. Gait instability -I do not think she is a candidate for physical therapy but maybe when home health sees him they can evaluate this again.  6. Incontinence of feces, unspecified fecal incontinence type -This is going to be a worsening problem. They will continue with the depends and hopefully a bedside commode. - Home Health - Face-to-face encounter (required for Medicare/Medicaid patients) - CBC with Differential/Platelet; Future - BMP8+EGFR; Future - Hepatic function panel; Future  7. Urinary incontinence, unspecified type -Continue with depends - Home Health - Face-to-face encounter (required for Medicare/Medicaid patients) - CBC with Differential/Platelet; Future - BMP8+EGFR; Future - Hepatic function panel; Future  8. [place] Face-to-face encounter (required for Medicare/Medicaid patients)  9. Confined to chair - Home Health - Face-to-face encounter (required for Medicare/Medicaid patients)  10. Increased sleeping -Check lab work - [place] - Face-to-face encounter (required for Medicare/Medicaid patients)  11. Age-related cognitive decline - Home Health - Face-to-face encounter (required for Medicare/Medicaid patients)  Finish taking gabapentin and discontinue this medication Consider discontinuing Namenda and Aricept Home health evaluation [facility] Consider hospice  No orders of the defined types were placed in this encounter.  Patient Instructions  We  will arrange home health and Hospice to come into the home Daughter will work on getting help from the [place]  Arrie Senate MD   .

## 2017-04-30 NOTE — Patient Instructions (Signed)
We will arrange home health and Hospice to come into the home [place]

## 2017-05-01 DIAGNOSIS — Z5181 Encounter for therapeutic drug level monitoring: Secondary | ICD-10-CM | POA: Diagnosis not present

## 2017-05-01 DIAGNOSIS — E039 Hypothyroidism, unspecified: Secondary | ICD-10-CM | POA: Diagnosis not present

## 2017-05-01 DIAGNOSIS — Z029 Encounter for administrative examinations, unspecified: Secondary | ICD-10-CM

## 2017-05-01 DIAGNOSIS — G309 Alzheimer's disease, unspecified: Secondary | ICD-10-CM | POA: Diagnosis not present

## 2017-05-01 DIAGNOSIS — N183 Chronic kidney disease, stage 3 (moderate): Secondary | ICD-10-CM | POA: Diagnosis not present

## 2017-05-01 DIAGNOSIS — M4692 Unspecified inflammatory spondylopathy, cervical region: Secondary | ICD-10-CM | POA: Diagnosis not present

## 2017-05-01 DIAGNOSIS — F0280 Dementia in other diseases classified elsewhere without behavioral disturbance: Secondary | ICD-10-CM | POA: Diagnosis not present

## 2017-05-05 ENCOUNTER — Telehealth: Payer: Self-pay | Admitting: Family Medicine

## 2017-05-05 DIAGNOSIS — G309 Alzheimer's disease, unspecified: Secondary | ICD-10-CM | POA: Diagnosis not present

## 2017-05-05 DIAGNOSIS — F0280 Dementia in other diseases classified elsewhere without behavioral disturbance: Secondary | ICD-10-CM | POA: Diagnosis not present

## 2017-05-05 NOTE — Telephone Encounter (Signed)
forwarded to Almira CoasterGina to order hospice

## 2017-05-06 DIAGNOSIS — G309 Alzheimer's disease, unspecified: Secondary | ICD-10-CM | POA: Diagnosis not present

## 2017-05-06 DIAGNOSIS — F0280 Dementia in other diseases classified elsewhere without behavioral disturbance: Secondary | ICD-10-CM | POA: Diagnosis not present

## 2017-05-06 NOTE — Telephone Encounter (Signed)
Hospice ordered and all documents sent

## 2017-05-12 DIAGNOSIS — R1310 Dysphagia, unspecified: Secondary | ICD-10-CM | POA: Diagnosis not present

## 2017-05-12 DIAGNOSIS — R531 Weakness: Secondary | ICD-10-CM | POA: Diagnosis not present

## 2017-05-12 DIAGNOSIS — E039 Hypothyroidism, unspecified: Secondary | ICD-10-CM | POA: Diagnosis not present

## 2017-05-12 DIAGNOSIS — G309 Alzheimer's disease, unspecified: Secondary | ICD-10-CM | POA: Diagnosis not present

## 2017-05-13 DIAGNOSIS — E039 Hypothyroidism, unspecified: Secondary | ICD-10-CM | POA: Diagnosis not present

## 2017-05-13 DIAGNOSIS — G309 Alzheimer's disease, unspecified: Secondary | ICD-10-CM | POA: Diagnosis not present

## 2017-05-13 DIAGNOSIS — R1310 Dysphagia, unspecified: Secondary | ICD-10-CM | POA: Diagnosis not present

## 2017-05-13 DIAGNOSIS — R531 Weakness: Secondary | ICD-10-CM | POA: Diagnosis not present

## 2017-05-15 DIAGNOSIS — R531 Weakness: Secondary | ICD-10-CM | POA: Diagnosis not present

## 2017-05-15 DIAGNOSIS — E039 Hypothyroidism, unspecified: Secondary | ICD-10-CM | POA: Diagnosis not present

## 2017-05-15 DIAGNOSIS — R1310 Dysphagia, unspecified: Secondary | ICD-10-CM | POA: Diagnosis not present

## 2017-05-15 DIAGNOSIS — G309 Alzheimer's disease, unspecified: Secondary | ICD-10-CM | POA: Diagnosis not present

## 2017-05-16 DIAGNOSIS — G309 Alzheimer's disease, unspecified: Secondary | ICD-10-CM | POA: Diagnosis not present

## 2017-05-16 DIAGNOSIS — R531 Weakness: Secondary | ICD-10-CM | POA: Diagnosis not present

## 2017-05-16 DIAGNOSIS — R1310 Dysphagia, unspecified: Secondary | ICD-10-CM | POA: Diagnosis not present

## 2017-05-16 DIAGNOSIS — E039 Hypothyroidism, unspecified: Secondary | ICD-10-CM | POA: Diagnosis not present

## 2017-05-19 DIAGNOSIS — R531 Weakness: Secondary | ICD-10-CM | POA: Diagnosis not present

## 2017-05-19 DIAGNOSIS — G309 Alzheimer's disease, unspecified: Secondary | ICD-10-CM | POA: Diagnosis not present

## 2017-05-19 DIAGNOSIS — E039 Hypothyroidism, unspecified: Secondary | ICD-10-CM | POA: Diagnosis not present

## 2017-05-19 DIAGNOSIS — R1310 Dysphagia, unspecified: Secondary | ICD-10-CM | POA: Diagnosis not present

## 2017-05-20 DIAGNOSIS — G309 Alzheimer's disease, unspecified: Secondary | ICD-10-CM | POA: Diagnosis not present

## 2017-05-20 DIAGNOSIS — E039 Hypothyroidism, unspecified: Secondary | ICD-10-CM | POA: Diagnosis not present

## 2017-05-20 DIAGNOSIS — R531 Weakness: Secondary | ICD-10-CM | POA: Diagnosis not present

## 2017-05-20 DIAGNOSIS — R1310 Dysphagia, unspecified: Secondary | ICD-10-CM | POA: Diagnosis not present

## 2017-05-23 DIAGNOSIS — G309 Alzheimer's disease, unspecified: Secondary | ICD-10-CM | POA: Diagnosis not present

## 2017-05-23 DIAGNOSIS — R531 Weakness: Secondary | ICD-10-CM | POA: Diagnosis not present

## 2017-05-23 DIAGNOSIS — E039 Hypothyroidism, unspecified: Secondary | ICD-10-CM | POA: Diagnosis not present

## 2017-05-23 DIAGNOSIS — R1310 Dysphagia, unspecified: Secondary | ICD-10-CM | POA: Diagnosis not present

## 2017-05-26 DIAGNOSIS — E039 Hypothyroidism, unspecified: Secondary | ICD-10-CM | POA: Diagnosis not present

## 2017-05-26 DIAGNOSIS — R531 Weakness: Secondary | ICD-10-CM | POA: Diagnosis not present

## 2017-05-26 DIAGNOSIS — G309 Alzheimer's disease, unspecified: Secondary | ICD-10-CM | POA: Diagnosis not present

## 2017-05-26 DIAGNOSIS — R1310 Dysphagia, unspecified: Secondary | ICD-10-CM | POA: Diagnosis not present

## 2017-05-27 DIAGNOSIS — E039 Hypothyroidism, unspecified: Secondary | ICD-10-CM | POA: Diagnosis not present

## 2017-05-27 DIAGNOSIS — R531 Weakness: Secondary | ICD-10-CM | POA: Diagnosis not present

## 2017-05-27 DIAGNOSIS — R1310 Dysphagia, unspecified: Secondary | ICD-10-CM | POA: Diagnosis not present

## 2017-05-27 DIAGNOSIS — G309 Alzheimer's disease, unspecified: Secondary | ICD-10-CM | POA: Diagnosis not present

## 2017-05-30 DIAGNOSIS — G309 Alzheimer's disease, unspecified: Secondary | ICD-10-CM | POA: Diagnosis not present

## 2017-05-30 DIAGNOSIS — R531 Weakness: Secondary | ICD-10-CM | POA: Diagnosis not present

## 2017-05-30 DIAGNOSIS — E039 Hypothyroidism, unspecified: Secondary | ICD-10-CM | POA: Diagnosis not present

## 2017-05-30 DIAGNOSIS — R1310 Dysphagia, unspecified: Secondary | ICD-10-CM | POA: Diagnosis not present

## 2017-06-02 DIAGNOSIS — G309 Alzheimer's disease, unspecified: Secondary | ICD-10-CM | POA: Diagnosis not present

## 2017-06-02 DIAGNOSIS — R1310 Dysphagia, unspecified: Secondary | ICD-10-CM | POA: Diagnosis not present

## 2017-06-02 DIAGNOSIS — E039 Hypothyroidism, unspecified: Secondary | ICD-10-CM | POA: Diagnosis not present

## 2017-06-02 DIAGNOSIS — R531 Weakness: Secondary | ICD-10-CM | POA: Diagnosis not present

## 2017-06-03 DIAGNOSIS — E039 Hypothyroidism, unspecified: Secondary | ICD-10-CM | POA: Diagnosis not present

## 2017-06-03 DIAGNOSIS — R531 Weakness: Secondary | ICD-10-CM | POA: Diagnosis not present

## 2017-06-03 DIAGNOSIS — G309 Alzheimer's disease, unspecified: Secondary | ICD-10-CM | POA: Diagnosis not present

## 2017-06-03 DIAGNOSIS — R1310 Dysphagia, unspecified: Secondary | ICD-10-CM | POA: Diagnosis not present

## 2017-06-06 DIAGNOSIS — E039 Hypothyroidism, unspecified: Secondary | ICD-10-CM | POA: Diagnosis not present

## 2017-06-06 DIAGNOSIS — R531 Weakness: Secondary | ICD-10-CM | POA: Diagnosis not present

## 2017-06-06 DIAGNOSIS — R1310 Dysphagia, unspecified: Secondary | ICD-10-CM | POA: Diagnosis not present

## 2017-06-06 DIAGNOSIS — G309 Alzheimer's disease, unspecified: Secondary | ICD-10-CM | POA: Diagnosis not present

## 2017-06-09 DIAGNOSIS — E039 Hypothyroidism, unspecified: Secondary | ICD-10-CM | POA: Diagnosis not present

## 2017-06-09 DIAGNOSIS — R1310 Dysphagia, unspecified: Secondary | ICD-10-CM | POA: Diagnosis not present

## 2017-06-09 DIAGNOSIS — G309 Alzheimer's disease, unspecified: Secondary | ICD-10-CM | POA: Diagnosis not present

## 2017-06-09 DIAGNOSIS — R531 Weakness: Secondary | ICD-10-CM | POA: Diagnosis not present

## 2017-06-10 ENCOUNTER — Ambulatory Visit (INDEPENDENT_AMBULATORY_CARE_PROVIDER_SITE_OTHER): Payer: Medicare Other | Admitting: Family Medicine

## 2017-06-10 DIAGNOSIS — G309 Alzheimer's disease, unspecified: Secondary | ICD-10-CM

## 2017-06-10 DIAGNOSIS — E039 Hypothyroidism, unspecified: Secondary | ICD-10-CM | POA: Diagnosis not present

## 2017-06-10 DIAGNOSIS — E559 Vitamin D deficiency, unspecified: Secondary | ICD-10-CM

## 2017-06-10 DIAGNOSIS — N400 Enlarged prostate without lower urinary tract symptoms: Secondary | ICD-10-CM

## 2017-06-10 DIAGNOSIS — R531 Weakness: Secondary | ICD-10-CM | POA: Diagnosis not present

## 2017-06-10 DIAGNOSIS — M4692 Unspecified inflammatory spondylopathy, cervical region: Secondary | ICD-10-CM | POA: Diagnosis not present

## 2017-06-10 DIAGNOSIS — R1310 Dysphagia, unspecified: Secondary | ICD-10-CM | POA: Diagnosis not present

## 2017-06-10 DIAGNOSIS — F0280 Dementia in other diseases classified elsewhere without behavioral disturbance: Secondary | ICD-10-CM | POA: Diagnosis not present

## 2017-06-10 DIAGNOSIS — Z9181 History of falling: Secondary | ICD-10-CM | POA: Diagnosis not present

## 2017-06-10 DIAGNOSIS — N183 Chronic kidney disease, stage 3 (moderate): Secondary | ICD-10-CM | POA: Diagnosis not present

## 2017-06-11 DIAGNOSIS — E039 Hypothyroidism, unspecified: Secondary | ICD-10-CM | POA: Diagnosis not present

## 2017-06-11 DIAGNOSIS — R1310 Dysphagia, unspecified: Secondary | ICD-10-CM | POA: Diagnosis not present

## 2017-06-11 DIAGNOSIS — G309 Alzheimer's disease, unspecified: Secondary | ICD-10-CM | POA: Diagnosis not present

## 2017-06-11 DIAGNOSIS — R531 Weakness: Secondary | ICD-10-CM | POA: Diagnosis not present

## 2017-06-12 DIAGNOSIS — R1310 Dysphagia, unspecified: Secondary | ICD-10-CM | POA: Diagnosis not present

## 2017-06-12 DIAGNOSIS — R531 Weakness: Secondary | ICD-10-CM | POA: Diagnosis not present

## 2017-06-12 DIAGNOSIS — G309 Alzheimer's disease, unspecified: Secondary | ICD-10-CM | POA: Diagnosis not present

## 2017-06-12 DIAGNOSIS — E039 Hypothyroidism, unspecified: Secondary | ICD-10-CM | POA: Diagnosis not present

## 2017-06-13 DIAGNOSIS — E039 Hypothyroidism, unspecified: Secondary | ICD-10-CM | POA: Diagnosis not present

## 2017-06-13 DIAGNOSIS — G309 Alzheimer's disease, unspecified: Secondary | ICD-10-CM | POA: Diagnosis not present

## 2017-06-13 DIAGNOSIS — R531 Weakness: Secondary | ICD-10-CM | POA: Diagnosis not present

## 2017-06-13 DIAGNOSIS — R1310 Dysphagia, unspecified: Secondary | ICD-10-CM | POA: Diagnosis not present

## 2017-06-17 DIAGNOSIS — E039 Hypothyroidism, unspecified: Secondary | ICD-10-CM | POA: Diagnosis not present

## 2017-06-17 DIAGNOSIS — R1310 Dysphagia, unspecified: Secondary | ICD-10-CM | POA: Diagnosis not present

## 2017-06-17 DIAGNOSIS — R531 Weakness: Secondary | ICD-10-CM | POA: Diagnosis not present

## 2017-06-17 DIAGNOSIS — G309 Alzheimer's disease, unspecified: Secondary | ICD-10-CM | POA: Diagnosis not present

## 2017-06-19 DIAGNOSIS — G309 Alzheimer's disease, unspecified: Secondary | ICD-10-CM | POA: Diagnosis not present

## 2017-06-19 DIAGNOSIS — R1310 Dysphagia, unspecified: Secondary | ICD-10-CM | POA: Diagnosis not present

## 2017-06-19 DIAGNOSIS — R531 Weakness: Secondary | ICD-10-CM | POA: Diagnosis not present

## 2017-06-19 DIAGNOSIS — E039 Hypothyroidism, unspecified: Secondary | ICD-10-CM | POA: Diagnosis not present

## 2017-06-20 DIAGNOSIS — G309 Alzheimer's disease, unspecified: Secondary | ICD-10-CM | POA: Diagnosis not present

## 2017-06-20 DIAGNOSIS — E039 Hypothyroidism, unspecified: Secondary | ICD-10-CM | POA: Diagnosis not present

## 2017-06-20 DIAGNOSIS — R531 Weakness: Secondary | ICD-10-CM | POA: Diagnosis not present

## 2017-06-20 DIAGNOSIS — R1310 Dysphagia, unspecified: Secondary | ICD-10-CM | POA: Diagnosis not present

## 2017-06-23 DIAGNOSIS — G309 Alzheimer's disease, unspecified: Secondary | ICD-10-CM | POA: Diagnosis not present

## 2017-06-23 DIAGNOSIS — E039 Hypothyroidism, unspecified: Secondary | ICD-10-CM | POA: Diagnosis not present

## 2017-06-23 DIAGNOSIS — R1310 Dysphagia, unspecified: Secondary | ICD-10-CM | POA: Diagnosis not present

## 2017-06-23 DIAGNOSIS — R531 Weakness: Secondary | ICD-10-CM | POA: Diagnosis not present

## 2017-06-24 DIAGNOSIS — E039 Hypothyroidism, unspecified: Secondary | ICD-10-CM | POA: Diagnosis not present

## 2017-06-24 DIAGNOSIS — R1310 Dysphagia, unspecified: Secondary | ICD-10-CM | POA: Diagnosis not present

## 2017-06-24 DIAGNOSIS — R531 Weakness: Secondary | ICD-10-CM | POA: Diagnosis not present

## 2017-06-24 DIAGNOSIS — G309 Alzheimer's disease, unspecified: Secondary | ICD-10-CM | POA: Diagnosis not present

## 2017-06-25 DIAGNOSIS — R531 Weakness: Secondary | ICD-10-CM | POA: Diagnosis not present

## 2017-06-25 DIAGNOSIS — E039 Hypothyroidism, unspecified: Secondary | ICD-10-CM | POA: Diagnosis not present

## 2017-06-25 DIAGNOSIS — G309 Alzheimer's disease, unspecified: Secondary | ICD-10-CM | POA: Diagnosis not present

## 2017-06-25 DIAGNOSIS — R1310 Dysphagia, unspecified: Secondary | ICD-10-CM | POA: Diagnosis not present

## 2017-06-27 DIAGNOSIS — E039 Hypothyroidism, unspecified: Secondary | ICD-10-CM | POA: Diagnosis not present

## 2017-06-27 DIAGNOSIS — R531 Weakness: Secondary | ICD-10-CM | POA: Diagnosis not present

## 2017-06-27 DIAGNOSIS — R1310 Dysphagia, unspecified: Secondary | ICD-10-CM | POA: Diagnosis not present

## 2017-06-27 DIAGNOSIS — G309 Alzheimer's disease, unspecified: Secondary | ICD-10-CM | POA: Diagnosis not present

## 2017-06-30 DIAGNOSIS — R531 Weakness: Secondary | ICD-10-CM | POA: Diagnosis not present

## 2017-06-30 DIAGNOSIS — E039 Hypothyroidism, unspecified: Secondary | ICD-10-CM | POA: Diagnosis not present

## 2017-06-30 DIAGNOSIS — G309 Alzheimer's disease, unspecified: Secondary | ICD-10-CM | POA: Diagnosis not present

## 2017-06-30 DIAGNOSIS — R1310 Dysphagia, unspecified: Secondary | ICD-10-CM | POA: Diagnosis not present

## 2017-07-02 DIAGNOSIS — E039 Hypothyroidism, unspecified: Secondary | ICD-10-CM | POA: Diagnosis not present

## 2017-07-02 DIAGNOSIS — R1310 Dysphagia, unspecified: Secondary | ICD-10-CM | POA: Diagnosis not present

## 2017-07-02 DIAGNOSIS — G309 Alzheimer's disease, unspecified: Secondary | ICD-10-CM | POA: Diagnosis not present

## 2017-07-02 DIAGNOSIS — R531 Weakness: Secondary | ICD-10-CM | POA: Diagnosis not present

## 2017-07-04 DIAGNOSIS — R1310 Dysphagia, unspecified: Secondary | ICD-10-CM | POA: Diagnosis not present

## 2017-07-04 DIAGNOSIS — G309 Alzheimer's disease, unspecified: Secondary | ICD-10-CM | POA: Diagnosis not present

## 2017-07-04 DIAGNOSIS — E039 Hypothyroidism, unspecified: Secondary | ICD-10-CM | POA: Diagnosis not present

## 2017-07-04 DIAGNOSIS — R531 Weakness: Secondary | ICD-10-CM | POA: Diagnosis not present

## 2017-07-07 DIAGNOSIS — R531 Weakness: Secondary | ICD-10-CM | POA: Diagnosis not present

## 2017-07-07 DIAGNOSIS — E039 Hypothyroidism, unspecified: Secondary | ICD-10-CM | POA: Diagnosis not present

## 2017-07-07 DIAGNOSIS — G309 Alzheimer's disease, unspecified: Secondary | ICD-10-CM | POA: Diagnosis not present

## 2017-07-07 DIAGNOSIS — R1310 Dysphagia, unspecified: Secondary | ICD-10-CM | POA: Diagnosis not present

## 2017-07-08 DIAGNOSIS — E039 Hypothyroidism, unspecified: Secondary | ICD-10-CM | POA: Diagnosis not present

## 2017-07-08 DIAGNOSIS — G309 Alzheimer's disease, unspecified: Secondary | ICD-10-CM | POA: Diagnosis not present

## 2017-07-08 DIAGNOSIS — R1310 Dysphagia, unspecified: Secondary | ICD-10-CM | POA: Diagnosis not present

## 2017-07-08 DIAGNOSIS — R531 Weakness: Secondary | ICD-10-CM | POA: Diagnosis not present

## 2017-07-09 DIAGNOSIS — E039 Hypothyroidism, unspecified: Secondary | ICD-10-CM | POA: Diagnosis not present

## 2017-07-09 DIAGNOSIS — R531 Weakness: Secondary | ICD-10-CM | POA: Diagnosis not present

## 2017-07-09 DIAGNOSIS — G309 Alzheimer's disease, unspecified: Secondary | ICD-10-CM | POA: Diagnosis not present

## 2017-07-09 DIAGNOSIS — R1310 Dysphagia, unspecified: Secondary | ICD-10-CM | POA: Diagnosis not present

## 2017-07-11 DIAGNOSIS — E039 Hypothyroidism, unspecified: Secondary | ICD-10-CM | POA: Diagnosis not present

## 2017-07-11 DIAGNOSIS — G309 Alzheimer's disease, unspecified: Secondary | ICD-10-CM | POA: Diagnosis not present

## 2017-07-11 DIAGNOSIS — R1310 Dysphagia, unspecified: Secondary | ICD-10-CM | POA: Diagnosis not present

## 2017-07-11 DIAGNOSIS — R531 Weakness: Secondary | ICD-10-CM | POA: Diagnosis not present

## 2017-07-12 DIAGNOSIS — G309 Alzheimer's disease, unspecified: Secondary | ICD-10-CM | POA: Diagnosis not present

## 2017-07-12 DIAGNOSIS — E039 Hypothyroidism, unspecified: Secondary | ICD-10-CM | POA: Diagnosis not present

## 2017-07-12 DIAGNOSIS — R1310 Dysphagia, unspecified: Secondary | ICD-10-CM | POA: Diagnosis not present

## 2017-07-12 DIAGNOSIS — R531 Weakness: Secondary | ICD-10-CM | POA: Diagnosis not present

## 2017-07-16 DIAGNOSIS — E039 Hypothyroidism, unspecified: Secondary | ICD-10-CM | POA: Diagnosis not present

## 2017-07-16 DIAGNOSIS — R1310 Dysphagia, unspecified: Secondary | ICD-10-CM | POA: Diagnosis not present

## 2017-07-16 DIAGNOSIS — R531 Weakness: Secondary | ICD-10-CM | POA: Diagnosis not present

## 2017-07-16 DIAGNOSIS — G309 Alzheimer's disease, unspecified: Secondary | ICD-10-CM | POA: Diagnosis not present

## 2017-07-18 DIAGNOSIS — R1310 Dysphagia, unspecified: Secondary | ICD-10-CM | POA: Diagnosis not present

## 2017-07-18 DIAGNOSIS — R531 Weakness: Secondary | ICD-10-CM | POA: Diagnosis not present

## 2017-07-18 DIAGNOSIS — G309 Alzheimer's disease, unspecified: Secondary | ICD-10-CM | POA: Diagnosis not present

## 2017-07-18 DIAGNOSIS — E039 Hypothyroidism, unspecified: Secondary | ICD-10-CM | POA: Diagnosis not present

## 2017-07-21 DIAGNOSIS — E039 Hypothyroidism, unspecified: Secondary | ICD-10-CM | POA: Diagnosis not present

## 2017-07-21 DIAGNOSIS — R531 Weakness: Secondary | ICD-10-CM | POA: Diagnosis not present

## 2017-07-21 DIAGNOSIS — G309 Alzheimer's disease, unspecified: Secondary | ICD-10-CM | POA: Diagnosis not present

## 2017-07-21 DIAGNOSIS — R1310 Dysphagia, unspecified: Secondary | ICD-10-CM | POA: Diagnosis not present

## 2017-07-22 DIAGNOSIS — G309 Alzheimer's disease, unspecified: Secondary | ICD-10-CM | POA: Diagnosis not present

## 2017-07-22 DIAGNOSIS — E039 Hypothyroidism, unspecified: Secondary | ICD-10-CM | POA: Diagnosis not present

## 2017-07-22 DIAGNOSIS — R1310 Dysphagia, unspecified: Secondary | ICD-10-CM | POA: Diagnosis not present

## 2017-07-22 DIAGNOSIS — R531 Weakness: Secondary | ICD-10-CM | POA: Diagnosis not present

## 2017-07-23 DIAGNOSIS — R1310 Dysphagia, unspecified: Secondary | ICD-10-CM | POA: Diagnosis not present

## 2017-07-23 DIAGNOSIS — R531 Weakness: Secondary | ICD-10-CM | POA: Diagnosis not present

## 2017-07-23 DIAGNOSIS — G309 Alzheimer's disease, unspecified: Secondary | ICD-10-CM | POA: Diagnosis not present

## 2017-07-23 DIAGNOSIS — E039 Hypothyroidism, unspecified: Secondary | ICD-10-CM | POA: Diagnosis not present

## 2017-07-25 DIAGNOSIS — E039 Hypothyroidism, unspecified: Secondary | ICD-10-CM | POA: Diagnosis not present

## 2017-07-25 DIAGNOSIS — R1310 Dysphagia, unspecified: Secondary | ICD-10-CM | POA: Diagnosis not present

## 2017-07-25 DIAGNOSIS — G309 Alzheimer's disease, unspecified: Secondary | ICD-10-CM | POA: Diagnosis not present

## 2017-07-25 DIAGNOSIS — R531 Weakness: Secondary | ICD-10-CM | POA: Diagnosis not present

## 2017-07-28 ENCOUNTER — Other Ambulatory Visit: Payer: Self-pay | Admitting: Family Medicine

## 2017-07-28 DIAGNOSIS — R1310 Dysphagia, unspecified: Secondary | ICD-10-CM | POA: Diagnosis not present

## 2017-07-28 DIAGNOSIS — E039 Hypothyroidism, unspecified: Secondary | ICD-10-CM | POA: Diagnosis not present

## 2017-07-28 DIAGNOSIS — G309 Alzheimer's disease, unspecified: Secondary | ICD-10-CM | POA: Diagnosis not present

## 2017-07-28 DIAGNOSIS — R531 Weakness: Secondary | ICD-10-CM | POA: Diagnosis not present

## 2017-07-29 DIAGNOSIS — E039 Hypothyroidism, unspecified: Secondary | ICD-10-CM | POA: Diagnosis not present

## 2017-07-29 DIAGNOSIS — G309 Alzheimer's disease, unspecified: Secondary | ICD-10-CM | POA: Diagnosis not present

## 2017-07-29 DIAGNOSIS — R1310 Dysphagia, unspecified: Secondary | ICD-10-CM | POA: Diagnosis not present

## 2017-07-29 DIAGNOSIS — R531 Weakness: Secondary | ICD-10-CM | POA: Diagnosis not present

## 2017-07-30 DIAGNOSIS — R1310 Dysphagia, unspecified: Secondary | ICD-10-CM | POA: Diagnosis not present

## 2017-07-30 DIAGNOSIS — G309 Alzheimer's disease, unspecified: Secondary | ICD-10-CM | POA: Diagnosis not present

## 2017-07-30 DIAGNOSIS — E039 Hypothyroidism, unspecified: Secondary | ICD-10-CM | POA: Diagnosis not present

## 2017-07-30 DIAGNOSIS — R531 Weakness: Secondary | ICD-10-CM | POA: Diagnosis not present

## 2017-07-31 DIAGNOSIS — E039 Hypothyroidism, unspecified: Secondary | ICD-10-CM | POA: Diagnosis not present

## 2017-07-31 DIAGNOSIS — G309 Alzheimer's disease, unspecified: Secondary | ICD-10-CM | POA: Diagnosis not present

## 2017-07-31 DIAGNOSIS — R531 Weakness: Secondary | ICD-10-CM | POA: Diagnosis not present

## 2017-07-31 DIAGNOSIS — R1310 Dysphagia, unspecified: Secondary | ICD-10-CM | POA: Diagnosis not present

## 2017-08-01 DIAGNOSIS — R531 Weakness: Secondary | ICD-10-CM | POA: Diagnosis not present

## 2017-08-01 DIAGNOSIS — G309 Alzheimer's disease, unspecified: Secondary | ICD-10-CM | POA: Diagnosis not present

## 2017-08-01 DIAGNOSIS — R1310 Dysphagia, unspecified: Secondary | ICD-10-CM | POA: Diagnosis not present

## 2017-08-01 DIAGNOSIS — E039 Hypothyroidism, unspecified: Secondary | ICD-10-CM | POA: Diagnosis not present

## 2017-08-02 DIAGNOSIS — E039 Hypothyroidism, unspecified: Secondary | ICD-10-CM | POA: Diagnosis not present

## 2017-08-02 DIAGNOSIS — G309 Alzheimer's disease, unspecified: Secondary | ICD-10-CM | POA: Diagnosis not present

## 2017-08-02 DIAGNOSIS — R1310 Dysphagia, unspecified: Secondary | ICD-10-CM | POA: Diagnosis not present

## 2017-08-02 DIAGNOSIS — R531 Weakness: Secondary | ICD-10-CM | POA: Diagnosis not present

## 2017-08-03 DIAGNOSIS — G309 Alzheimer's disease, unspecified: Secondary | ICD-10-CM | POA: Diagnosis not present

## 2017-08-03 DIAGNOSIS — R531 Weakness: Secondary | ICD-10-CM | POA: Diagnosis not present

## 2017-08-03 DIAGNOSIS — R1310 Dysphagia, unspecified: Secondary | ICD-10-CM | POA: Diagnosis not present

## 2017-08-03 DIAGNOSIS — E039 Hypothyroidism, unspecified: Secondary | ICD-10-CM | POA: Diagnosis not present

## 2017-08-04 DIAGNOSIS — R1310 Dysphagia, unspecified: Secondary | ICD-10-CM | POA: Diagnosis not present

## 2017-08-04 DIAGNOSIS — E039 Hypothyroidism, unspecified: Secondary | ICD-10-CM | POA: Diagnosis not present

## 2017-08-04 DIAGNOSIS — R531 Weakness: Secondary | ICD-10-CM | POA: Diagnosis not present

## 2017-08-04 DIAGNOSIS — G309 Alzheimer's disease, unspecified: Secondary | ICD-10-CM | POA: Diagnosis not present

## 2017-08-05 DIAGNOSIS — G309 Alzheimer's disease, unspecified: Secondary | ICD-10-CM | POA: Diagnosis not present

## 2017-08-05 DIAGNOSIS — E039 Hypothyroidism, unspecified: Secondary | ICD-10-CM | POA: Diagnosis not present

## 2017-08-05 DIAGNOSIS — R1310 Dysphagia, unspecified: Secondary | ICD-10-CM | POA: Diagnosis not present

## 2017-08-05 DIAGNOSIS — R531 Weakness: Secondary | ICD-10-CM | POA: Diagnosis not present

## 2017-08-06 DIAGNOSIS — R1310 Dysphagia, unspecified: Secondary | ICD-10-CM | POA: Diagnosis not present

## 2017-08-06 DIAGNOSIS — R531 Weakness: Secondary | ICD-10-CM | POA: Diagnosis not present

## 2017-08-06 DIAGNOSIS — G309 Alzheimer's disease, unspecified: Secondary | ICD-10-CM | POA: Diagnosis not present

## 2017-08-06 DIAGNOSIS — E039 Hypothyroidism, unspecified: Secondary | ICD-10-CM | POA: Diagnosis not present

## 2017-08-07 DIAGNOSIS — E039 Hypothyroidism, unspecified: Secondary | ICD-10-CM | POA: Diagnosis not present

## 2017-08-07 DIAGNOSIS — R531 Weakness: Secondary | ICD-10-CM | POA: Diagnosis not present

## 2017-08-07 DIAGNOSIS — R1310 Dysphagia, unspecified: Secondary | ICD-10-CM | POA: Diagnosis not present

## 2017-08-07 DIAGNOSIS — G309 Alzheimer's disease, unspecified: Secondary | ICD-10-CM | POA: Diagnosis not present

## 2017-08-08 ENCOUNTER — Telehealth: Payer: Self-pay | Admitting: Family Medicine

## 2017-08-08 DIAGNOSIS — R531 Weakness: Secondary | ICD-10-CM | POA: Diagnosis not present

## 2017-08-08 DIAGNOSIS — E039 Hypothyroidism, unspecified: Secondary | ICD-10-CM | POA: Diagnosis not present

## 2017-08-08 DIAGNOSIS — R1310 Dysphagia, unspecified: Secondary | ICD-10-CM | POA: Diagnosis not present

## 2017-08-08 DIAGNOSIS — G309 Alzheimer's disease, unspecified: Secondary | ICD-10-CM | POA: Diagnosis not present

## 2017-08-10 DIAGNOSIS — R531 Weakness: Secondary | ICD-10-CM | POA: Diagnosis not present

## 2017-08-10 DIAGNOSIS — R1310 Dysphagia, unspecified: Secondary | ICD-10-CM | POA: Diagnosis not present

## 2017-08-10 DIAGNOSIS — G309 Alzheimer's disease, unspecified: Secondary | ICD-10-CM | POA: Diagnosis not present

## 2017-08-10 DIAGNOSIS — E039 Hypothyroidism, unspecified: Secondary | ICD-10-CM | POA: Diagnosis not present

## 2017-08-11 DIAGNOSIS — G309 Alzheimer's disease, unspecified: Secondary | ICD-10-CM | POA: Diagnosis not present

## 2017-08-11 DIAGNOSIS — R1310 Dysphagia, unspecified: Secondary | ICD-10-CM | POA: Diagnosis not present

## 2017-08-11 DIAGNOSIS — E039 Hypothyroidism, unspecified: Secondary | ICD-10-CM | POA: Diagnosis not present

## 2017-08-11 DIAGNOSIS — R52 Pain, unspecified: Secondary | ICD-10-CM | POA: Diagnosis not present

## 2017-08-11 DIAGNOSIS — G253 Myoclonus: Secondary | ICD-10-CM | POA: Diagnosis not present

## 2017-08-11 DIAGNOSIS — R682 Dry mouth, unspecified: Secondary | ICD-10-CM | POA: Diagnosis not present

## 2017-08-11 DIAGNOSIS — R531 Weakness: Secondary | ICD-10-CM | POA: Diagnosis not present

## 2017-08-11 NOTE — Telephone Encounter (Signed)
Patient's daughter called stating that patient is now at hospice in [place].

## 2017-08-12 DIAGNOSIS — R1310 Dysphagia, unspecified: Secondary | ICD-10-CM | POA: Diagnosis not present

## 2017-08-12 DIAGNOSIS — G253 Myoclonus: Secondary | ICD-10-CM | POA: Diagnosis not present

## 2017-08-12 DIAGNOSIS — G309 Alzheimer's disease, unspecified: Secondary | ICD-10-CM | POA: Diagnosis not present

## 2017-08-12 DIAGNOSIS — R531 Weakness: Secondary | ICD-10-CM | POA: Diagnosis not present

## 2017-08-12 DIAGNOSIS — R682 Dry mouth, unspecified: Secondary | ICD-10-CM | POA: Diagnosis not present

## 2017-08-12 DIAGNOSIS — E039 Hypothyroidism, unspecified: Secondary | ICD-10-CM | POA: Diagnosis not present

## 2017-08-12 NOTE — Telephone Encounter (Signed)
I called the patient's daughter spoke to her about her father who has had a serious decline in his health and is still at hospice. She wanted [place] to be aware of the situation.

## 2017-08-13 DIAGNOSIS — G309 Alzheimer's disease, unspecified: Secondary | ICD-10-CM | POA: Diagnosis not present

## 2017-08-13 DIAGNOSIS — E039 Hypothyroidism, unspecified: Secondary | ICD-10-CM | POA: Diagnosis not present

## 2017-08-13 DIAGNOSIS — R682 Dry mouth, unspecified: Secondary | ICD-10-CM | POA: Diagnosis not present

## 2017-08-13 DIAGNOSIS — G253 Myoclonus: Secondary | ICD-10-CM | POA: Diagnosis not present

## 2017-08-13 DIAGNOSIS — R1310 Dysphagia, unspecified: Secondary | ICD-10-CM | POA: Diagnosis not present

## 2017-08-13 DIAGNOSIS — R531 Weakness: Secondary | ICD-10-CM | POA: Diagnosis not present

## 2017-08-14 DIAGNOSIS — G309 Alzheimer's disease, unspecified: Secondary | ICD-10-CM | POA: Diagnosis not present

## 2017-08-14 DIAGNOSIS — R531 Weakness: Secondary | ICD-10-CM | POA: Diagnosis not present

## 2017-08-14 DIAGNOSIS — E039 Hypothyroidism, unspecified: Secondary | ICD-10-CM | POA: Diagnosis not present

## 2017-08-14 DIAGNOSIS — R1310 Dysphagia, unspecified: Secondary | ICD-10-CM | POA: Diagnosis not present

## 2017-08-14 DIAGNOSIS — R682 Dry mouth, unspecified: Secondary | ICD-10-CM | POA: Diagnosis not present

## 2017-08-14 DIAGNOSIS — G253 Myoclonus: Secondary | ICD-10-CM | POA: Diagnosis not present

## 2017-08-15 DIAGNOSIS — R682 Dry mouth, unspecified: Secondary | ICD-10-CM | POA: Diagnosis not present

## 2017-08-15 DIAGNOSIS — G309 Alzheimer's disease, unspecified: Secondary | ICD-10-CM | POA: Diagnosis not present

## 2017-08-15 DIAGNOSIS — R531 Weakness: Secondary | ICD-10-CM | POA: Diagnosis not present

## 2017-08-15 DIAGNOSIS — R1310 Dysphagia, unspecified: Secondary | ICD-10-CM | POA: Diagnosis not present

## 2017-08-15 DIAGNOSIS — E039 Hypothyroidism, unspecified: Secondary | ICD-10-CM | POA: Diagnosis not present

## 2017-08-15 DIAGNOSIS — G253 Myoclonus: Secondary | ICD-10-CM | POA: Diagnosis not present

## 2017-08-16 DIAGNOSIS — R1310 Dysphagia, unspecified: Secondary | ICD-10-CM | POA: Diagnosis not present

## 2017-08-16 DIAGNOSIS — G309 Alzheimer's disease, unspecified: Secondary | ICD-10-CM | POA: Diagnosis not present

## 2017-08-16 DIAGNOSIS — G253 Myoclonus: Secondary | ICD-10-CM | POA: Diagnosis not present

## 2017-08-16 DIAGNOSIS — R531 Weakness: Secondary | ICD-10-CM | POA: Diagnosis not present

## 2017-08-16 DIAGNOSIS — E039 Hypothyroidism, unspecified: Secondary | ICD-10-CM | POA: Diagnosis not present

## 2017-08-16 DIAGNOSIS — R682 Dry mouth, unspecified: Secondary | ICD-10-CM | POA: Diagnosis not present

## 2017-08-17 DIAGNOSIS — R682 Dry mouth, unspecified: Secondary | ICD-10-CM | POA: Diagnosis not present

## 2017-08-17 DIAGNOSIS — E039 Hypothyroidism, unspecified: Secondary | ICD-10-CM | POA: Diagnosis not present

## 2017-08-17 DIAGNOSIS — R531 Weakness: Secondary | ICD-10-CM | POA: Diagnosis not present

## 2017-08-17 DIAGNOSIS — G253 Myoclonus: Secondary | ICD-10-CM | POA: Diagnosis not present

## 2017-08-17 DIAGNOSIS — R1310 Dysphagia, unspecified: Secondary | ICD-10-CM | POA: Diagnosis not present

## 2017-08-17 DIAGNOSIS — G309 Alzheimer's disease, unspecified: Secondary | ICD-10-CM | POA: Diagnosis not present

## 2017-09-11 DEATH — deceased
